# Patient Record
Sex: Male | Born: 1945
Health system: Southern US, Community
[De-identification: ages and names within clinical notes are randomized; demographics above are authoritative.]

## PROBLEM LIST (undated history)

## (undated) DIAGNOSIS — I442 Atrioventricular block, complete: Secondary | ICD-10-CM

## (undated) DIAGNOSIS — I714 Abdominal aortic aneurysm, without rupture, unspecified: Secondary | ICD-10-CM

## (undated) DIAGNOSIS — R7989 Other specified abnormal findings of blood chemistry: Secondary | ICD-10-CM

## (undated) DIAGNOSIS — E785 Hyperlipidemia, unspecified: Secondary | ICD-10-CM

## (undated) DIAGNOSIS — K635 Polyp of colon: Secondary | ICD-10-CM

## (undated) DIAGNOSIS — M1990 Unspecified osteoarthritis, unspecified site: Secondary | ICD-10-CM

## (undated) DIAGNOSIS — I519 Heart disease, unspecified: Secondary | ICD-10-CM

## (undated) DIAGNOSIS — R945 Abnormal results of liver function studies: Secondary | ICD-10-CM

## (undated) DIAGNOSIS — I2510 Atherosclerotic heart disease of native coronary artery without angina pectoris: Secondary | ICD-10-CM

## (undated) DIAGNOSIS — I4891 Unspecified atrial fibrillation: Secondary | ICD-10-CM

## (undated) DIAGNOSIS — R5383 Other fatigue: Secondary | ICD-10-CM

## (undated) DIAGNOSIS — K219 Gastro-esophageal reflux disease without esophagitis: Secondary | ICD-10-CM

## (undated) DIAGNOSIS — G4730 Sleep apnea, unspecified: Secondary | ICD-10-CM

## (undated) DIAGNOSIS — R7301 Impaired fasting glucose: Secondary | ICD-10-CM

## (undated) DIAGNOSIS — M109 Gout, unspecified: Secondary | ICD-10-CM

## (undated) HISTORY — DX: Atherosclerotic heart disease of native coronary artery without angina pectoris: I25.10

## (undated) HISTORY — DX: Hyperlipidemia, unspecified: E78.5

## (undated) HISTORY — PX: AV NODE ABLATION: SHX1209

## (undated) HISTORY — DX: Abdominal aortic aneurysm, without rupture: I71.4

## (undated) HISTORY — PX: REPLACEMENT TOTAL KNEE: SUR1224

## (undated) HISTORY — DX: Unspecified osteoarthritis, unspecified site: M19.90

## (undated) HISTORY — DX: Sleep apnea, unspecified: G47.30

## (undated) HISTORY — DX: Abdominal aortic aneurysm, without rupture, unspecified: I71.40

## (undated) HISTORY — DX: Other fatigue: R53.83

## (undated) HISTORY — PX: CARDIAC DEFIBRILLATOR PLACEMENT: SHX171

## (undated) HISTORY — DX: Gout, unspecified: M10.9

## (undated) HISTORY — DX: Unspecified atrial fibrillation: I48.91

## (undated) HISTORY — DX: Other specified abnormal findings of blood chemistry: R79.89

## (undated) HISTORY — PX: HAND SURGERY: SHX662

## (undated) HISTORY — PX: HERNIA REPAIR: SHX51

## (undated) HISTORY — PX: HYDROCELE EXCISION: SHX482

## (undated) HISTORY — DX: Hemochromatosis, unspecified: E83.119

## (undated) HISTORY — DX: Gastro-esophageal reflux disease without esophagitis: K21.9

## (undated) HISTORY — DX: Impaired fasting glucose: R73.01

## (undated) HISTORY — DX: Atrioventricular block, complete: I44.2

## (undated) HISTORY — DX: Abnormal results of liver function studies: R94.5

## (undated) HISTORY — DX: Polyp of colon: K63.5

## (undated) HISTORY — DX: Heart disease, unspecified: I51.9

---

## 2001-12-07 ENCOUNTER — Ambulatory Visit (HOSPITAL_COMMUNITY): Admission: RE | Admit: 2001-12-07 | Discharge: 2001-12-07 | Payer: Self-pay | Admitting: Surgery

## 2004-10-10 HISTORY — PX: PACEMAKER INSERTION: SHX728

## 2005-08-23 ENCOUNTER — Ambulatory Visit: Payer: Self-pay | Admitting: Internal Medicine

## 2005-09-02 ENCOUNTER — Ambulatory Visit: Payer: Self-pay | Admitting: Internal Medicine

## 2005-09-08 ENCOUNTER — Observation Stay (HOSPITAL_COMMUNITY): Admission: AD | Admit: 2005-09-08 | Discharge: 2005-09-09 | Payer: Self-pay | Admitting: Internal Medicine

## 2005-09-08 ENCOUNTER — Ambulatory Visit: Payer: Self-pay | Admitting: Internal Medicine

## 2005-09-21 ENCOUNTER — Ambulatory Visit: Payer: Self-pay

## 2006-01-03 ENCOUNTER — Ambulatory Visit: Payer: Self-pay | Admitting: Internal Medicine

## 2006-03-07 ENCOUNTER — Ambulatory Visit: Payer: Self-pay | Admitting: Internal Medicine

## 2006-03-31 ENCOUNTER — Emergency Department (HOSPITAL_COMMUNITY): Admission: EM | Admit: 2006-03-31 | Discharge: 2006-04-01 | Payer: Self-pay | Admitting: Emergency Medicine

## 2006-04-21 ENCOUNTER — Ambulatory Visit: Payer: Self-pay | Admitting: Internal Medicine

## 2006-05-01 ENCOUNTER — Encounter: Payer: Self-pay | Admitting: Internal Medicine

## 2006-05-01 ENCOUNTER — Ambulatory Visit (HOSPITAL_COMMUNITY): Admission: RE | Admit: 2006-05-01 | Discharge: 2006-05-01 | Payer: Self-pay | Admitting: Internal Medicine

## 2006-05-01 ENCOUNTER — Encounter (INDEPENDENT_AMBULATORY_CARE_PROVIDER_SITE_OTHER): Payer: Self-pay | Admitting: Specialist

## 2006-05-09 ENCOUNTER — Ambulatory Visit: Payer: Self-pay | Admitting: Internal Medicine

## 2006-07-06 ENCOUNTER — Ambulatory Visit: Payer: Self-pay

## 2006-09-08 ENCOUNTER — Ambulatory Visit: Payer: Self-pay

## 2007-04-03 ENCOUNTER — Ambulatory Visit: Payer: Self-pay | Admitting: Internal Medicine

## 2007-06-26 ENCOUNTER — Ambulatory Visit: Payer: Self-pay | Admitting: Internal Medicine

## 2009-01-08 DIAGNOSIS — Z860100 Personal history of colon polyps, unspecified: Secondary | ICD-10-CM | POA: Insufficient documentation

## 2009-01-08 DIAGNOSIS — E7800 Pure hypercholesterolemia, unspecified: Secondary | ICD-10-CM

## 2009-01-08 DIAGNOSIS — K219 Gastro-esophageal reflux disease without esophagitis: Secondary | ICD-10-CM

## 2009-01-08 DIAGNOSIS — I499 Cardiac arrhythmia, unspecified: Secondary | ICD-10-CM | POA: Insufficient documentation

## 2009-01-08 DIAGNOSIS — I10 Essential (primary) hypertension: Secondary | ICD-10-CM | POA: Insufficient documentation

## 2009-01-13 ENCOUNTER — Ambulatory Visit: Payer: Self-pay | Admitting: Internal Medicine

## 2009-01-13 DIAGNOSIS — R1319 Other dysphagia: Secondary | ICD-10-CM

## 2009-01-20 ENCOUNTER — Ambulatory Visit (HOSPITAL_COMMUNITY): Admission: RE | Admit: 2009-01-20 | Discharge: 2009-01-20 | Payer: Self-pay | Admitting: Internal Medicine

## 2009-01-21 ENCOUNTER — Encounter: Payer: Self-pay | Admitting: Internal Medicine

## 2009-02-09 ENCOUNTER — Inpatient Hospital Stay (HOSPITAL_COMMUNITY): Admission: RE | Admit: 2009-02-09 | Discharge: 2009-02-13 | Payer: Self-pay | Admitting: Orthopedic Surgery

## 2009-02-11 ENCOUNTER — Encounter: Payer: Self-pay | Admitting: Internal Medicine

## 2009-02-12 ENCOUNTER — Encounter: Payer: Self-pay | Admitting: Internal Medicine

## 2009-04-25 ENCOUNTER — Encounter: Payer: Self-pay | Admitting: Internal Medicine

## 2009-04-25 ENCOUNTER — Emergency Department (HOSPITAL_COMMUNITY): Admission: EM | Admit: 2009-04-25 | Discharge: 2009-04-25 | Payer: Self-pay | Admitting: Emergency Medicine

## 2009-04-30 ENCOUNTER — Ambulatory Visit: Payer: Self-pay | Admitting: Internal Medicine

## 2009-04-30 ENCOUNTER — Encounter: Payer: Self-pay | Admitting: Internal Medicine

## 2009-04-30 DIAGNOSIS — I428 Other cardiomyopathies: Secondary | ICD-10-CM

## 2009-04-30 LAB — CONVERTED CEMR LAB
Basophils Relative: 0.6 % (ref 0.0–3.0)
CO2: 29 meq/L (ref 19–32)
Chloride: 106 meq/L (ref 96–112)
Creatinine, Ser: 1.1 mg/dL (ref 0.4–1.5)
Eosinophils Relative: 2.1 % (ref 0.0–5.0)
Hemoglobin: 13.5 g/dL (ref 13.0–17.0)
INR: 2.3 — ABNORMAL HIGH (ref 0.8–1.0)
Lymphocytes Relative: 13.4 % (ref 12.0–46.0)
MCHC: 34.8 g/dL (ref 30.0–36.0)
MCV: 95.9 fL (ref 78.0–100.0)
Monocytes Absolute: 0.4 10*3/uL (ref 0.1–1.0)
Neutro Abs: 5 10*3/uL (ref 1.4–7.7)
Neutrophils Relative %: 77.4 % — ABNORMAL HIGH (ref 43.0–77.0)
RBC: 4.03 M/uL — ABNORMAL LOW (ref 4.22–5.81)
Sodium: 140 meq/L (ref 135–145)
WBC: 6.3 10*3/uL (ref 4.5–10.5)
aPTT: 36.8 s — ABNORMAL HIGH (ref 21.7–28.8)

## 2009-05-01 ENCOUNTER — Ambulatory Visit: Payer: Self-pay | Admitting: Internal Medicine

## 2009-05-01 ENCOUNTER — Ambulatory Visit (HOSPITAL_COMMUNITY): Admission: RE | Admit: 2009-05-01 | Discharge: 2009-05-01 | Payer: Self-pay | Admitting: Internal Medicine

## 2009-05-04 ENCOUNTER — Encounter: Payer: Self-pay | Admitting: Internal Medicine

## 2009-05-11 ENCOUNTER — Ambulatory Visit: Payer: Self-pay

## 2009-05-11 ENCOUNTER — Encounter: Payer: Self-pay | Admitting: Internal Medicine

## 2009-06-19 ENCOUNTER — Telehealth (INDEPENDENT_AMBULATORY_CARE_PROVIDER_SITE_OTHER): Payer: Self-pay | Admitting: *Deleted

## 2009-07-01 ENCOUNTER — Encounter (INDEPENDENT_AMBULATORY_CARE_PROVIDER_SITE_OTHER): Payer: Self-pay | Admitting: *Deleted

## 2009-08-14 ENCOUNTER — Ambulatory Visit: Payer: Self-pay | Admitting: Internal Medicine

## 2009-08-14 DIAGNOSIS — I442 Atrioventricular block, complete: Secondary | ICD-10-CM

## 2009-08-14 DIAGNOSIS — I4891 Unspecified atrial fibrillation: Secondary | ICD-10-CM | POA: Insufficient documentation

## 2009-08-26 ENCOUNTER — Inpatient Hospital Stay (HOSPITAL_COMMUNITY): Admission: RE | Admit: 2009-08-26 | Discharge: 2009-08-29 | Payer: Self-pay | Admitting: Orthopedic Surgery

## 2009-12-03 ENCOUNTER — Ambulatory Visit (HOSPITAL_COMMUNITY): Admission: RE | Admit: 2009-12-03 | Discharge: 2009-12-04 | Payer: Self-pay | Admitting: Orthopedic Surgery

## 2009-12-29 ENCOUNTER — Encounter: Payer: Self-pay | Admitting: Internal Medicine

## 2010-05-10 ENCOUNTER — Ambulatory Visit: Payer: Self-pay | Admitting: Cardiology

## 2010-05-12 ENCOUNTER — Ambulatory Visit (HOSPITAL_COMMUNITY): Admission: RE | Admit: 2010-05-12 | Discharge: 2010-05-12 | Payer: Self-pay | Admitting: Cardiology

## 2010-05-12 ENCOUNTER — Ambulatory Visit: Payer: Self-pay | Admitting: Cardiology

## 2010-06-06 ENCOUNTER — Ambulatory Visit: Payer: Self-pay | Admitting: Cardiology

## 2010-06-18 ENCOUNTER — Ambulatory Visit: Payer: Self-pay | Admitting: Cardiology

## 2010-10-21 ENCOUNTER — Ambulatory Visit: Payer: Self-pay | Admitting: Cardiology

## 2010-10-31 ENCOUNTER — Encounter: Payer: Self-pay | Admitting: Internal Medicine

## 2010-11-09 NOTE — Miscellaneous (Signed)
Summary: DEVICE UPDATE  Clinical Lists Changes  Observations: Added new observation of ICD MD: Inactive (12/29/2009 10:00)       ICD Specifications Following MD:  Inactive     Referring MD:  Delfin Edis, MD ICD Vendor:  Medtronic     ICD Model Number:  [phone]     ICD Serial Number:  [id number] ICD DOI:  05/01/2009     ICD Implanting MD:  Hillis Range, MD  Lead 1:    Location: RA     DOI: 09/08/2005     Model #: 2956     Serial #: [id number]     Status: active Lead 2:    Location: RV     DOI: 09/08/2005     Model #: 6962     Serial #: [id number]     Status: active Lead 3:    Location: LV     DOI: 09/08/2005     Model #: 4194     Serial #: [id number]     Status: active Lead 4:    Location: RV     DOI: 09/08/2005     Model #: 6644     Serial #: [id number]     Status: active   Explantation Comments: InSync Maximo  [id number] h explanted 05/01/2009  ICD Follow Up ICD Dependent:  Yes      Episodes Coumadin:  Yes  Brady Parameters Mode VVIR     Lower Rate Limit:  60     Upper Rate Limit 120  Tachy Zones VF:  200     VT:  250     VT1:  171

## 2010-11-10 ENCOUNTER — Encounter: Payer: Self-pay | Admitting: Internal Medicine

## 2010-11-10 ENCOUNTER — Ambulatory Visit: Admit: 2010-11-10 | Payer: Self-pay | Admitting: Internal Medicine

## 2010-11-10 ENCOUNTER — Encounter (INDEPENDENT_AMBULATORY_CARE_PROVIDER_SITE_OTHER): Payer: BLUE CROSS/BLUE SHIELD

## 2010-11-10 DIAGNOSIS — I428 Other cardiomyopathies: Secondary | ICD-10-CM

## 2010-11-17 NOTE — Procedures (Signed)
Summary: [facility]   Current Medications (verified): 1)  Coumadin 7.5 Mg Tabs (Warfarin Sodium) .Marland Kitchen.. 1 By Mouth 5 Days Per Week 2)  Coumadin 10 Mg Tabs (Warfarin Sodium) .Marland Kitchen.. 1 By Mouth Once Daily 2 Days Per Week 3)  Carvedilol 25 Mg Tabs (Carvedilol) .... One By Mouth Two Times A Day 4)  Simvastatin 80 Mg Tabs (Simvastatin) .... Take Half Tablet By Mouth Daily At Bedtime 5)  Diovan 80 Mg Tabs (Valsartan) .... Take One Tablet By Mouth Daily  Allergies: 1)  ! Niacin 2)  ! Advicor (Niacin-Lovastatin) 3)  ! * No Ivp Dye Allergy 4)  ! * No Shellfish Allergy 5)  ! * No Latex Allergy   ICD Specifications Following MD:  Hillis Range, MD     Referring MD:  Delfin Edis, MD ICD Vendor:  Medtronic     ICD Model Number:  [phone]     ICD Serial Number:  [id number] ICD DOI:  05/01/2009     ICD Implanting MD:  Hillis Range, MD  Lead 1:    Location: RA     DOI: 09/08/2005     Model #: 5621     Serial #: [id number]     Status: active Lead 2:    Location: RV     DOI: 09/08/2005     Model #: 6295     Serial #: [id number]     Status: active Lead 3:    Location: LV     DOI: 09/08/2005     Model #: 4194     Serial #: [id number]     Status: active Lead 4:    Location: RV     DOI: 09/08/2005     Model #: 4403     Serial #: [id number]     Status: active  Indications::  CM  Explantation Comments: InSync Maximo  [id number] h explanted 05/01/2009  ICD Follow Up Remote Check?  No Battery Voltage:  3.06 V     Charge Time:  10.1 seconds     Underlying rhythm:  A-fib/no escape ICD Dependent:  Yes       ICD Device Measurements Atrium:  Amplitude: 0.6 mV, Impedance: 551 ohms,  Right Ventricle:  Impedance: 589 ohms, Threshold: 0.5 V at 0.6 msec Left Ventricle:  Impedance: 798 ohms, Threshold: 0.75 V at 0.6 msec Shock Impedance: 46/64 ohms   Episodes MS Episodes:  0     Percent Mode Switch:  100%     Coumadin:  Yes Shock:  0     ATP:  0     Nonsustained:  0     Atrial Pacing:  0%      Ventricular Pacing:  99.9%  Brady Parameters Mode VVIR     Lower Rate Limit:  60     Upper Rate Limit 120  Tachy Zones VF:  200     VT:  250     VT1:  171     Next Remote Date:  02/10/2011     Next Cardiology Appt Due:  08/11/2011 Tech Comments:  Lead impedance alert values reprogrammed.  6949 lead stable, SIC  0.  Optivol and thoracic impedance normal.  Carelink transmissions every 3 months.  ROV 11/12 with Dr. Johney Frame. Altha Harm, LPN  November 10, 2010 11:54 AM

## 2010-12-21 NOTE — Cardiovascular Report (Signed)
Summary: Office Visit   Office Visit   Imported By: Roderic Ovens 12/15/2010 11:56:16  _____________________________________________________________________  External Attachment:    Type:   Image     Comment:   External Document

## 2010-12-29 LAB — BASIC METABOLIC PANEL
Chloride: 106 mEq/L (ref 96–112)
GFR calc non Af Amer: 59 mL/min — ABNORMAL LOW (ref >60)
Potassium: 3.9 mEq/L (ref 3.5–5.1)
Sodium: 141 mEq/L (ref 135–145)

## 2010-12-29 LAB — APTT: aPTT: 36 seconds (ref 24–37)

## 2010-12-29 LAB — CBC
HCT: 37.4 % — ABNORMAL LOW (ref 39.0–52.0)
Hemoglobin: 12.9 g/dL — ABNORMAL LOW (ref 13.0–17.0)
MCV: 96.8 fL (ref 78.0–100.0)
RBC: 3.86 MIL/uL — ABNORMAL LOW (ref 4.22–5.81)
WBC: 6.4 10*3/uL (ref 4.0–10.5)

## 2011-01-12 LAB — CBC
HCT: 27.8 % — ABNORMAL LOW (ref 39.0–52.0)
HCT: 38.6 % — ABNORMAL LOW (ref 39.0–52.0)
Hemoglobin: 9.7 g/dL — ABNORMAL LOW (ref 13.0–17.0)
Hemoglobin: 13.3 g/dL (ref 13.0–17.0)
MCHC: 34 g/dL (ref 30.0–36.0)
MCV: 96.8 fL (ref 78.0–100.0)
RBC: 2.44 MIL/uL — ABNORMAL LOW (ref 4.22–5.81)
RBC: 2.89 MIL/uL — ABNORMAL LOW (ref 4.22–5.81)
RBC: 3 MIL/uL — ABNORMAL LOW (ref 4.22–5.81)
RBC: 3.99 MIL/uL — ABNORMAL LOW (ref 4.22–5.81)
WBC: 8.3 10*3/uL (ref 4.0–10.5)
WBC: 8.8 10*3/uL (ref 4.0–10.5)
WBC: 9.7 10*3/uL (ref 4.0–10.5)
WBC: 4.2 10*3/uL (ref 4.0–10.5)

## 2011-01-12 LAB — CROSSMATCH: Antibody Screen: NEGATIVE

## 2011-01-12 LAB — PROTIME-INR
INR: 1.15 (ref 0.00–1.49)
INR: 1.3 (ref 0.00–1.49)
INR: 1.42 (ref 0.00–1.49)
Prothrombin Time: 14.6 seconds (ref 11.6–15.2)
Prothrombin Time: 16.1 seconds — ABNORMAL HIGH (ref 11.6–15.2)
Prothrombin Time: 17.2 seconds — ABNORMAL HIGH (ref 11.6–15.2)

## 2011-01-12 LAB — COMPREHENSIVE METABOLIC PANEL
AST: 20 U/L (ref 0–37)
BUN: 21 mg/dL (ref 6–23)
CO2: 29 mEq/L (ref 19–32)
Chloride: 107 mEq/L (ref 96–112)
Creatinine, Ser: 1.02 mg/dL (ref 0.4–1.5)
GFR calc Af Amer: >60 mL/min (ref >60)
GFR calc non Af Amer: >60 mL/min (ref >60)
Total Bilirubin: 1.1 mg/dL (ref 0.3–1.2)

## 2011-01-12 LAB — URINALYSIS, ROUTINE W REFLEX MICROSCOPIC
Glucose, UA: NEGATIVE mg/dL
Ketones, ur: NEGATIVE mg/dL
Leukocytes, UA: NEGATIVE
Nitrite: NEGATIVE
Specific Gravity, Urine: 1.037 — ABNORMAL HIGH (ref 1.005–1.030)
Urobilinogen, UA: 1 mg/dL (ref 0.0–1.0)
pH: 6 (ref 5.0–8.0)

## 2011-01-12 LAB — BASIC METABOLIC PANEL
CO2: 30 mEq/L (ref 19–32)
Calcium: 8 mg/dL — ABNORMAL LOW (ref 8.4–10.5)
Calcium: 8.1 mg/dL — ABNORMAL LOW (ref 8.4–10.5)
Chloride: 101 mEq/L (ref 96–112)
Creatinine, Ser: 0.87 mg/dL (ref 0.4–1.5)
Creatinine, Ser: 0.94 mg/dL (ref 0.4–1.5)
GFR calc Af Amer: >60 mL/min (ref >60)
GFR calc Af Amer: >60 mL/min (ref >60)
GFR calc non Af Amer: >60 mL/min (ref >60)
GFR calc non Af Amer: >60 mL/min (ref >60)
Potassium: 3.6 mEq/L (ref 3.5–5.1)
Sodium: 133 mEq/L — ABNORMAL LOW (ref 135–145)

## 2011-01-12 LAB — APTT
aPTT: 29 seconds (ref 24–37)
aPTT: 30 seconds (ref 24–37)

## 2011-01-12 LAB — URINE CULTURE
Colony Count: NO GROWTH
Special Requests: NEGATIVE

## 2011-01-12 LAB — URINE MICROSCOPIC-ADD ON

## 2011-01-16 LAB — GLUCOSE, CAPILLARY: Glucose-Capillary: 75 mg/dL (ref 70–99)

## 2011-01-16 LAB — PROTIME-INR: Prothrombin Time: 25.3 seconds — ABNORMAL HIGH (ref 11.6–15.2)

## 2011-01-16 LAB — APTT: aPTT: 42 seconds — ABNORMAL HIGH (ref 24–37)

## 2011-01-18 LAB — CBC
HCT: 25.8 % — ABNORMAL LOW (ref 39.0–52.0)
HCT: 29.4 % — ABNORMAL LOW (ref 39.0–52.0)
Hemoglobin: 10.4 g/dL — ABNORMAL LOW (ref 13.0–17.0)
Hemoglobin: 9.2 g/dL — ABNORMAL LOW (ref 13.0–17.0)
Platelets: 141 10*3/uL — ABNORMAL LOW (ref 150–400)
Platelets: 177 10*3/uL (ref 150–400)
RBC: 2.63 MIL/uL — ABNORMAL LOW (ref 4.22–5.81)
RBC: 3.48 MIL/uL — ABNORMAL LOW (ref 4.22–5.81)
WBC: 6.7 10*3/uL (ref 4.0–10.5)
WBC: 9.5 10*3/uL (ref 4.0–10.5)
WBC: 9.9 10*3/uL (ref 4.0–10.5)

## 2011-01-18 LAB — BASIC METABOLIC PANEL
BUN: 14 mg/dL (ref 6–23)
CO2: 26 mEq/L (ref 19–32)
Calcium: 8.1 mg/dL — ABNORMAL LOW (ref 8.4–10.5)
Calcium: 8.3 mg/dL — ABNORMAL LOW (ref 8.4–10.5)
Calcium: 8.3 mg/dL — ABNORMAL LOW (ref 8.4–10.5)
Chloride: 98 mEq/L (ref 96–112)
Creatinine, Ser: 0.97 mg/dL (ref 0.4–1.5)
Creatinine, Ser: 1.05 mg/dL (ref 0.4–1.5)
GFR calc Af Amer: >60 mL/min (ref >60)
GFR calc Af Amer: >60 mL/min (ref >60)
GFR calc Af Amer: >60 mL/min (ref >60)
GFR calc non Af Amer: >60 mL/min (ref >60)
Potassium: 4 mEq/L (ref 3.5–5.1)
Potassium: 4.1 mEq/L (ref 3.5–5.1)
Sodium: 131 mEq/L — ABNORMAL LOW (ref 135–145)
Sodium: 136 mEq/L (ref 135–145)

## 2011-01-18 LAB — PROTIME-INR
INR: 1.1 (ref 0.00–1.49)
INR: 1.2 (ref 0.00–1.49)
INR: 1.7 — ABNORMAL HIGH (ref 0.00–1.49)
Prothrombin Time: 15 seconds (ref 11.6–15.2)
Prothrombin Time: 15.5 seconds — ABNORMAL HIGH (ref 11.6–15.2)

## 2011-01-18 LAB — APTT: aPTT: 30 seconds (ref 24–37)

## 2011-01-18 LAB — BRAIN NATRIURETIC PEPTIDE: Pro B Natriuretic peptide (BNP): 208 pg/mL — ABNORMAL HIGH (ref 0.0–100.0)

## 2011-01-18 LAB — TROPONIN I: Troponin I: 0.01 ng/mL (ref 0.00–0.06)

## 2011-01-18 LAB — ABO/RH: ABO/RH(D): O POS

## 2011-01-19 LAB — COMPREHENSIVE METABOLIC PANEL
ALT: 25 U/L (ref 0–53)
AST: 26 U/L (ref 0–37)
Albumin: 3.9 g/dL (ref 3.5–5.2)
Alkaline Phosphatase: 76 U/L (ref 39–117)
BUN: 15 mg/dL (ref 6–23)
Chloride: 105 mEq/L (ref 96–112)
GFR calc Af Amer: >60 mL/min (ref >60)
Potassium: 4.4 mEq/L (ref 3.5–5.1)
Sodium: 140 mEq/L (ref 135–145)
Total Bilirubin: 1 mg/dL (ref 0.3–1.2)

## 2011-01-19 LAB — CBC
HCT: 42.7 % (ref 39.0–52.0)
Platelets: 227 10*3/uL (ref 150–400)
WBC: 5.2 10*3/uL (ref 4.0–10.5)

## 2011-01-19 LAB — URINALYSIS, ROUTINE W REFLEX MICROSCOPIC
Bilirubin Urine: NEGATIVE
Glucose, UA: NEGATIVE mg/dL
Hgb urine dipstick: NEGATIVE
Specific Gravity, Urine: 1.021 (ref 1.005–1.030)
pH: 6 (ref 5.0–8.0)

## 2011-01-25 ENCOUNTER — Encounter: Payer: Self-pay | Admitting: Internal Medicine

## 2011-01-25 ENCOUNTER — Ambulatory Visit (INDEPENDENT_AMBULATORY_CARE_PROVIDER_SITE_OTHER): Payer: PRIVATE HEALTH INSURANCE | Admitting: Internal Medicine

## 2011-01-25 VITALS — BP 96/56 | HR 84 | Ht 77.0 in | Wt 274.0 lb

## 2011-01-25 DIAGNOSIS — K219 Gastro-esophageal reflux disease without esophagitis: Secondary | ICD-10-CM

## 2011-01-25 DIAGNOSIS — Z860100 Personal history of colon polyps, unspecified: Secondary | ICD-10-CM

## 2011-01-25 DIAGNOSIS — Z1211 Encounter for screening for malignant neoplasm of colon: Secondary | ICD-10-CM

## 2011-01-25 DIAGNOSIS — D689 Coagulation defect, unspecified: Secondary | ICD-10-CM

## 2011-01-25 DIAGNOSIS — R1310 Dysphagia, unspecified: Secondary | ICD-10-CM

## 2011-01-25 MED ORDER — PEG-KCL-NACL-NASULF-NA ASC-C 100 G PO SOLR: 1.0000 | Freq: Once | ORAL | Status: AC

## 2011-01-25 NOTE — Patient Instructions (Signed)
Colon/Endo with Dil scheduled for 02/03/11 1:30 arrive at 12:30 on 4th floor. Movi prep prescription has been sent to your pharmacy for you to pick up. Colonoscopy and Endoscopy brochures give for your review. We will send Dr. Deborah Chalk a letter to approve holding your Coumadin x 5 days prior to your procedure. Our office will contact you regarding this.   If you have not heard from [place] by 1 week prior to your procedure, please call us.

## 2011-01-25 NOTE — Progress Notes (Signed)
HISTORY OF PRESENT ILLNESS:  [name] is a 65 y.o. 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 He is on chronic Coumadin therapy. He presents today regarding surveillance colonoscopy as well as ongoing problems with intermittent dysphagia. He was seen last year regarding swallowing difficulties. Barium esophagogram with tablet was essentially unremarkable. He was placed on PPI therapy for known GERD. Despite this, no improvement in swallowing. A small hiatal hernia was noted. He continues to describe what sounds like intermittent solid food dysphagia. For GERD symptoms he takes a vinegar extract, which he states works. His cardiovascular problems have been stable. His cardiologist is Dr. Deborah Chalk. His last colonoscopy was in July of 2007. Descending colon adenoma removed. Currently due for followup.  REVIEW OF SYSTEMS:  All non-GI ROS negative except for redness, fatigue, hearing problems, heart rhythm change, shortness of breath, insomnia.  Past Medical History  Diagnosis Date  . Atrial fibrillation   . Hyperlipidemia   . Sleep apnea   . Arthritis     Past Surgical History  Procedure Date  . Hernia repair   . Replacement total knee     bilateral  . Hydrocele excision   . Av node ablation   . Pacemaker insertion   . Cardiac defibrillator placement     X2  . Hand surgery     left    Social History [name]  reports that he quit smoking about 32 years ago. His smoking use included Cigarettes. He has never used smokeless tobacco. He reports that he drinks alcohol. He reports that he does not use illicit drugs.  family history includes Brain cancer in his brother.  There is no history of Colon cancer.  Allergies  Allergen Reactions  . Lisinopril   . Niacin        PHYSICAL  EXAMINATION: Vital signs: BP 96/56  Pulse 84  Ht 6\' 5"  (1.956 m)  Wt 274 lb (124.286 kg)  BMI 32.49 kg/m2  Constitutional: generally well-appearing, no acute distress Psychiatric: alert and oriented x3, cooperative Eyes: extraocular movements intact, anicteric, conjunctiva pink Mouth: oral pharynx moist, no lesions Neck: supple no lymphadenopathy Cardiovascular: heart regular rate and rhythm, no murmur Lungs: clear to auscultation bilaterally Abdomen: soft, nontender, nondistended, no obvious ascites, no peritoneal signs, normal bowel sounds, no organomegaly Rectal: Deferred until colonoscopy Extremities: no lower extremity edema bilaterally Skin: no lesions on visible extremities Neuro: No focal deficits..     ASSESSMENT:  #1. Intermittent solid food dysphagia. Likely has subtle ring or stricture not appreciated on esophagram.  #2. GERD. Currently being managed with ventricular extract.  #3. History of adenomatous colon polyp. Due for followup surveillance colonoscopy  #4. Multiple cardiovascular problems with implantable defibrillator and pacemaker placement as well as the need for chronic Coumadin therapy   PLAN:  #1. Upper endoscopy with esophageal dilation.The nature of the procedure, as well as the risks, benefits, and alternatives were carefully and thoroughly reviewed with the patient. Ample time for discussion and questions allowed. The patient understood, was satisfied, and agreed to proceed. The patient is high risk. We will consult with his cardiologist regarding the feasibility of holding Coumadin therapy (as was done previously) for his procedure.  #2. Colonoscopy with polypectomy if necessary.The nature of the procedure, as well as the risks, benefits, and alternatives were carefully and thoroughly reviewed with the patient. Ample time for discussion and  questions allowed. The patient understood, was satisfied, and agreed to proceed. Will be performed concurrently  with upper endoscopy for clinical reasons

## 2011-01-26 ENCOUNTER — Telehealth: Payer: Self-pay | Admitting: Internal Medicine

## 2011-01-26 ENCOUNTER — Telehealth: Payer: Self-pay | Admitting: *Deleted

## 2011-01-26 NOTE — Telephone Encounter (Signed)
Pt called with concerns regarding his procedures he has scheduled and his insurance coverage. Morrie Sheldon in precert to call patient back and speck with him regarding his coverage.

## 2011-01-26 NOTE — Telephone Encounter (Signed)
Pt. Informed.

## 2011-02-02 ENCOUNTER — Encounter: Payer: Self-pay | Admitting: Internal Medicine

## 2011-02-03 ENCOUNTER — Encounter: Payer: PRIVATE HEALTH INSURANCE | Admitting: Internal Medicine

## 2011-02-03 ENCOUNTER — Ambulatory Visit (AMBULATORY_SURGERY_CENTER): Payer: PRIVATE HEALTH INSURANCE | Admitting: Internal Medicine

## 2011-02-03 ENCOUNTER — Encounter: Payer: Self-pay | Admitting: Internal Medicine

## 2011-02-03 VITALS — BP 122/59 | HR 60 | Temp 97.5°F | Resp 17 | Ht 77.0 in | Wt 270.0 lb

## 2011-02-03 DIAGNOSIS — Z860100 Personal history of colon polyps, unspecified: Secondary | ICD-10-CM

## 2011-02-03 DIAGNOSIS — Z1211 Encounter for screening for malignant neoplasm of colon: Secondary | ICD-10-CM

## 2011-02-03 MED ORDER — SODIUM CHLORIDE 0.9 % IV SOLN: 500.0000 mL | INTRAVENOUS | Status: DC

## 2011-02-03 NOTE — Progress Notes (Signed)
Pressure applied to abdomen to reach cecum.  

## 2011-02-03 NOTE — Patient Instructions (Signed)
Discharged instructions given with verbal understanding. Normal examination. Resume previous medications.

## 2011-02-04 ENCOUNTER — Telehealth: Payer: Self-pay

## 2011-02-04 NOTE — Telephone Encounter (Signed)
No answer

## 2011-02-10 ENCOUNTER — Encounter: Payer: BLUE CROSS/BLUE SHIELD | Admitting: *Deleted

## 2011-02-13 ENCOUNTER — Encounter: Payer: Self-pay | Admitting: *Deleted

## 2011-02-21 ENCOUNTER — Ambulatory Visit (INDEPENDENT_AMBULATORY_CARE_PROVIDER_SITE_OTHER): Payer: PRIVATE HEALTH INSURANCE | Admitting: *Deleted

## 2011-02-21 ENCOUNTER — Other Ambulatory Visit: Payer: Self-pay | Admitting: Internal Medicine

## 2011-02-21 DIAGNOSIS — I442 Atrioventricular block, complete: Secondary | ICD-10-CM

## 2011-02-21 DIAGNOSIS — I509 Heart failure, unspecified: Secondary | ICD-10-CM

## 2011-02-21 DIAGNOSIS — I428 Other cardiomyopathies: Secondary | ICD-10-CM

## 2011-02-21 DIAGNOSIS — I4891 Unspecified atrial fibrillation: Secondary | ICD-10-CM

## 2011-02-22 NOTE — Op Note (Signed)
NAME:  [name], [name] NO.:  [id number]   MEDICAL RECORD NO.:  [id number]          PATIENT TYPE:  INP   LOCATION:  0003                         FACILITY:  [facility]   PHYSICIAN:  Ollen Gross, M.D.    DATE OF BIRTH:  [birth year]   DATE OF PROCEDURE:  02/09/2009  DATE OF DISCHARGE:                               OPERATIVE REPORT   PREOPERATIVE DIAGNOSIS:  Osteoarthritis, right knee.   POSTOPERATIVE DIAGNOSIS:  Osteoarthritis, right knee.   PROCEDURE:  Right total knee arthroplasty.   SURGEON:  Ollen Gross, M.D.   ASSISTANT:  Alexzandrew L. Perkins, P.A.C.   ANESTHESIA:  Spinal.   ESTIMATED BLOOD LOSS:  Minimal.   DRAINS:  None.   TOURNIQUET TIME:  38 minutes at 300 mmHg.   COMPLICATIONS:  None.   CONDITION:  Stable to recovery.   BRIEF CLINICAL NOTE:  [name] is a 65 year old male with severe end-  stage arthritis of the right knee with progressively worsening pain and  dysfunction.  He has failed nonoperative management and presents now for  right total knee arthroplasty.   PROCEDURE IN DETAIL:  After successful administration of spinal  anesthetic a tourniquet was placed high on the right thigh and right  lower extremity prepped and draped in the usual sterile fashion.  The  extremity was wrapped in Esmarch, knee flexed, tourniquet inflated to  300 mmHg.  A midline incision was made with a 10 blade through  subcutaneous tissue to the level of the extensor mechanism.  A fresh  blade was used to make a medial parapatellar arthrotomy.  The soft  tissue of the proximal medial tibia was subperiosteally elevated to the  joint line with the knife and into the semimembranosus bursa with a Cobb  elevator.  The soft tissue laterally was elevated with attention being  paid to avoid the patellar tendon on the tibial tubercle.  The patella  was subluxed laterally, knee flexed 90 degrees and ACL and PCL were  removed.  A drill was used create a starting hole in  the distal femur  and the canal was thoroughly irrigated.  The 5 degrees right valgus  alignment guide was placed referencing off the posterior condyles.  Rotation was marked and the block pinned to remove 11 mm off the distal  femur.  I took 11 because I felt he was going to be a size 6 which  requires extra resection.  The distal femoral resection was made with an  oscillating saw.  The sizing block was placed and size 6 was most  appropriate.  The rotation was marked off the epicondylar axis.  The  size 6 cutting block was placed and the anterior, posterior and chamfer  cuts were made.   The tibia was subluxed forward and the menisci were removed.  The  extramedullary tibial alignment guide was placed referencing proximally  at the medial aspect of the tibial tubercle and distally along the  second metatarsal axis and tibial crest.  The block was pinned to remove  about 2 mm off the more deficient medial side.  The tibial  resection was  made with an oscillating saw.  The sizing block was placed and size 5  was most appropriate.  The proximal tibia was then prepared with the  modular drill and keel punch for the size 5.  Femoral preparation was  completed with the intercondylar cut for the size 6.   The size 5 mobile bearing tibial trial, size 6 posterior stabilized  femoral trial and a 10 mm posterior stabilized rotating platform insert  trial are placed.  With the 10, he has a tiny bit of hyperextension so I  went to 12.5 which allowed for full extension with excellent varus,  valgus, anterior and posterior balance throughout full range of motion.  The patella was then everted and thickness measured to be 27 mm.  The  freehand resection was taken to 15 mm, a 41 template was placed, lug  holes were drilled, trial patella was placed and it tracks normally.  Osteophytes were removed off the posterior femur with the trial in  place.  All trials were removed and the cut bone surfaces  were prepared  with pulsatile lavage.  The cement was mixed and once ready for  implantation a size 5 mobile bearing tibial tray, a size 6 posterior  stabilized femur and 41 patella were cemented into place and the patella  was held with a clamp.  The trial 12.5 mm insert was placed, the knee  held in full extension and all extruded cement removed.  When the cement  was fully hardened then the permanent 12.5 mm posterior stabilized  rotating platform insert was placed into the tibial tray.  The wound was  copiously irrigated with saline solution and then the FloSeal injected  on the posterior capsule, medial and lateral gutters and the  suprapatellar area.  A moist sponge was placed and tourniquet released  with total time of 38 minutes.  The sponge was held for 2 minutes then  removed.  Minimal bleeding was encountered.  The bleeding that was  encountered was stopped with electrocautery.  The wound was again  irrigated and the arthrotomy closed with interrupted #1 PDS.  Flexion  against gravity was about 140 degrees.  The subcutaneous was closed with  interrupted 2-0 Vicryl and subcuticular running 4-0 Monocryl.  The  incision was cleaned and dried and Steri-Strips and bulky sterile  dressing were applied.  He was then placed into a knee immobilizer,  awakened and transported to recovery in stable condition.      Ollen Gross, M.D.  Electronically Signed     FA/MEDQ  D:  02/09/2009  T:  02/09/2009  Job:  [id number]

## 2011-02-22 NOTE — Op Note (Signed)
NAME:  NAREK, KNISS NO.:  [id number]   MEDICAL RECORD NO.:  [id number]          PATIENT TYPE:  OIB   LOCATION:  2899                         FACILITY:  [facility]   PHYSICIAN:  Hillis Range, MD       DATE OF BIRTH:  November 04, 1945   DATE OF PROCEDURE:  DATE OF DISCHARGE:  05/01/2009                               OPERATIVE REPORT   PREPROCEDURE DIAGNOSES:  1. Complete atrioventricular block  2. Nonischemic cardiomyopathy.  3. Permanent atrial fibrillation.   POSTPROCEDURE DIAGNOSES:  1. Complete atrioventricular block  2. Nonischemic cardiomyopathy.  3. Permanent atrial fibrillation.   PROCEDURES:  1. Biventricular implantable cardioverter-defibrillator pulse      generator replacement.  2. Lead evaluation and device testing with defibrillator threshold      testing.   INTRODUCTION:  Mr. Sames is a pleasant 65 year old gentleman with a  history of permanent atrial fibrillation status post AV nodal ablation  and pacemaker implantation in 2004.  He subsequently developed symptoms  of heart failure and underwent upgrade of his device to a biventricular  defibrillator by Dr. Graciela Husbands in 2006.  Though he had a Sprint Fidelis lead  placed, Dr. Graciela Husbands opted to use the previously implanted Medtronic model  4076 - 58 lead for pacing and sensing.  The patient has done very well  since that time.  He recently was found to have his device at elective  replacement interval.  He therefore presents today for a biventricular  ICD pulse generator replacement.   DESCRIPTION OF PROCEDURE:  Informed written consent was obtained and the  patient was brought to the electrophysiology lab in the fasting state.  He was adequately sedated with intravenous Versed and fentanyl as  outlined in the nursing report.  The patient's left chest was prepped  and draped in the usual sterile fashion by the EP lab staff.  The skin  overlying the left deltopectoral region was infiltrated with lidocaine  for local analgesia.  A 5-cm incision was made over the existing  defibrillator pocket.  The device was exposed using a combination of  sharp and blunt dissection.  Electrocautery was used to maintain  hemostasis throughout.  The device was freed within the pocket and  removed from the body.  The leads were then freed within the pocket.  They were examined in their integrity and was confirmed to be intact.  The leads were then disconnected from the device and the device was  discarded.  The atrial lead was confirmed to be a Medtronic model 5076  CapSure Fix (serial number [id number]) lead.  The right ventricular  pace sense lead was confirmed to be a Medtronic model 4076 - 58 (serial  number [id number]) lead.  The right ventricular defibrillator lead was  confirmed to be a Medtronic model [id number] Sprint Fidelis lead (serial  number [id number]) lead.  The pace sense portion of this lead had  previously been capped and was not interrogated today.  The left  ventricular lead was confirmed to be a Medtronic model 4194 Attain  (serial number [id number])  lead.  All leads were confirmed to have  intact insulation and their integrity appeared secure.  Right atrial  lead atrial fibrillation wave measured 1 mV.  The right ventricular pace  sense lead R-wave measured 22.8 mV with an impedance of 650 ohms and a  threshold of 0.75 volts at 0.5 milliseconds.  The left ventricular lead  R-wave measured 22.8 mV with an impedance of 789 ohms and a threshold of  1.2 volts at 0.5 milliseconds.  The shock impedance proximally was 56  ohms and distally was 46 ohms.  These leads were then connected to a  Medtronic Concerto II CRT-D (serial number [id number]) biventricular  ICD.  The Medtronic model [phone] 58 lead was placed into the pace sense  port of the right ventricular lead.  The existing Sprint Fidelis [id number]  SVC and RV shock coils were placed into their respective ports.  The  atrial lead was placed  into its respective port and the coronary sinus  lead was placed into its respective port.  The pocket was then irrigated  with copious gentamicin solution.  Pocket revision was required to  accommodate the new device.  Electrocautery was used for hemostasis.  The device was then placed into the pocket.  The pocket was closed in  two layers with 2.0 Vicryl suture for the subcutaneous and subcuticular  layers.  Steri-Strips and a sterile dressing were then applied.  Defibrillation threshold testing was then performed and ventricular  fibrillation was induced with a T shock.  Adequate sensing of  ventricular fibrillation was observed with a programmed sensitivity of  1.2 mV with no dropout.  The patient was successfully defibrillated with  a single 10 joules shock delivered from the device with a duration of 6  seconds and an impedance of 43 ohms.  He remained in atrial fibrillation  with ventricular pacing thereafter.  There were no early apparent  complications.   CONCLUSIONS:  1. Biventricular ICD at elective replacement interval.  2. Successful biventricular ICD pulse generator replacement.  3. DFT less than or equal to 10 joules.  4. No early apparent complications.      Hillis Range, MD  Electronically Signed     JA/MEDQ  D:  05/01/2009  T:  05/02/2009  Job:  [id number]   cc:   Colleen Can. Deborah Chalk, M.D.  Duke Salvia, MD, [facility]

## 2011-02-22 NOTE — Discharge Summary (Signed)
NAME:  MARISA, HUFSTETLER NO.:  [id number]   MEDICAL RECORD NO.:  [id number]          PATIENT TYPE:  OIB   LOCATION:  2899                         FACILITY:  [facility]   PHYSICIAN:  Hillis Range, MD       DATE OF BIRTH:  [birth year]   DATE OF ADMISSION:  05/01/2009  DATE OF DISCHARGE:  05/01/2009                               DISCHARGE SUMMARY   This patient has allergies to NIACIN, ADVICOR, and LISINOPRIL.   Time for this dictation and explanation to the patient greater than 35  minutes.   FINAL DIAGNOSIS:  Discharging day #1 status post generator change:  Explanted was a Medtronic Maximo biventricular implantable cardioverter-  defibrillator, implanted was a Medtronic CONCERTO II  CRT-D  biventricular implantable cardioverter-defibrillator.   SECONDARY DIAGNOSES:  1. Nonischemic cardiomyopathy.      a.     New York Heart Association class II chronic systolic       congestive heart failure.  2. Persistent atrial fibrillation/chronic Coumadin.      a.     Status post arteriovenous node ablation with pacemaker       implantation in 2004.  3. Medtronic MAXIMO implanted as a biventricular implantable      cardioverter-defibrillator on September 08, 1005 for progressive      left ventricular dilatation/decreased ejection fraction.  4. Dyslipidemia.  5. Obstructive sleep apnea.  6. Arthritis.  7. Status post herniorrhaphy.  8. Left total knee arthroplasty.  9. Hydrocele repair.  10.Hypertension.  11.Resection of basal cell carcinoma.   PROCEDURE:  On May 01, 2009, generator change of a BiV ICD with explant  of the existing Medtronic Maximo device and implant of the Medtronic  Concerto II BiV ICD, Dr. Hillis Range.  Defibrillator threshold study  less than or equal to 10 joules.  No apparent complications after the  procedure.  The device was explanted because it was at elective  replacement indicator.   BRIEF HISTORY:  Mr. Bilodeau is a 65 year old male.  He has a  history of  nonischemic cardiomyopathy.  He had a pacemaker implanted in 2004 after  AV node ablation.  He has been on chronic Coumadin.  He had nonischemic  cardiomyopathy with decreased ejection fraction which occasioned implant  of BiV ICD on September 08, 2005.  The patient noted auditory response  from his device and presents now for generator change since it is at  elective replacement indicator.   The patient presents electively on May 01, 2009.  He underwent the  generator change without complication by Dr. Johney Frame.  The patient will  be discharged the same day on his medications which are as follows:  1. Coumadin 7.5 mg 6 days of the week.  Coumadin 10 mg Thursday only.      The patient has 5 mg tablets.  2. Carvedilol 25 mg tablets 1 to 1-1/2 tablets twice daily.  3. Simvastatin 80 mg tablets 1/2 tablet daily at bedtime.  4. Diovan 160 mg 1/2 tablet daily.   The patient follows up at [facility], [address]  [facility] on Monday, May 11, 2009, at 9 o'clock.  He sees Dr.  Johney Frame on Friday, July 31, 2009 at 9:20.  He is asked to keep his  incision dry for the next 7 days.  He is to sponge bathe until Friday,  May 08, 2009.  In the morning of Saturday, May 02, 2009, he is to take  off the bulky bandage and leave the incision open to the air.  For the  next 2 weeks, he has been counseled to avoid strenuous activity both in  order to keep the pocket healing well and also to avoid perspiration  which might cause moistening of the incision which leads to infection.      Maple Mirza, [place]      Hillis Range, MD  Electronically Signed    GM/MEDQ  D:  05/01/2009  T:  05/02/2009  Job:  [id number]   cc:   Colleen Can. Deborah Chalk, M.D.  Kirstie Peri, MD

## 2011-02-22 NOTE — Discharge Summary (Signed)
NAME:  [name], [name] NO.:  [id number]   MEDICAL RECORD NO.:  [id number]          PATIENT TYPE:  INP   LOCATION:  1607                         FACILITY:  [facility]   PHYSICIAN:  Ollen Gross, M.D.    DATE OF BIRTH:  09-07-1946   DATE OF ADMISSION:  02/09/2009  DATE OF DISCHARGE:  02/13/2009                               DISCHARGE SUMMARY   ADMITTING DIAGNOSES:  1. Osteoarthritis right knee.  2. Hearing loss.  3. Recently diagnosed congestive heart failure.  4. Chronic atrial fibrillation.  5. Status post pacemaker defibrillator placement.  6. Reflux disease.  7. History of basal cell skin cancer.  8. Arthritis.   DISCHARGE DIAGNOSES:  1. Osteoarthritis right knee status post right total knee replacement      arthroplasty.  2. Postoperative acute blood loss anemia.  Did not require      transfusion.  3. Postop hyponatremia, improved.  4. Hearing loss.  5. Recently diagnosed congestive heart failure.  6. Chronic atrial fibrillation.  7. Status post pacemaker defibrillator placement.  8. Reflux disease.  9. History of basal cell skin cancer.  10.Arthritis.   PROCEDURE:  Feb 09, 2009 right total knee.  Surgeon Dr. Lequita Halt,  assistant Avel Peace PA-C.  Spinal anesthesia.  Tourniquet time 38  minutes.   CONSULTS:  None.   BRIEF HISTORY:  [name] is a 65 year old male with severe end-stage  arthritis of the right knee, progressive worsening pain and dysfunction  who failed operative management and now presents for a total knee  arthroplasty.   LABORATORY DATA:  Preop CBC showed hemoglobin of 14.7, hematocrit 42.7,  white cell count 5.2, platelets 227.  PT/INR 41.5 and 3.9, respectively.  Was on Coumadin.  It was rechecked prior to surgery, the day of.  His  INR was normal at 1.1.  Chem panel on admission:  Slightly low glucose  of 125.  Remaining chem panel within normal limits.  Preop UA was  negative.  Serial CBCs were followed.  Hemoglobin dropped  down to 12 and  10.4.  Last known H and H 9.2 and 25.8.  Serial protimes followed per  Coumadin protocol.  Last known PT/INR 20.8 and 1.7.  Serial BMETs were  followed.  Sodium did drop down from 140 to 131.  Got as low as 129,  back up to 136.  Remaining electrolytes remained within normal limits.  He did have a slightly elevated BNP postop of 208.  One set of cardiac  enzymes taken on Feb 11, 2009 showed normal CK of 69, normal index of  1.1, troponin normal at 0.01.   X-RAYS:  Portable chest Feb 11, 2009:  No active lung disease, stable  mild cardiomegaly with permanent pacemaker.  Preop EKG January 30, 2009:  Electronic ventricular pacing, underlying rhythm, atrial fibrillation,  __________ Dr. Nanetta Batty.  Follow-up EKG Feb 11, 2009:  Undetermined  rhythm right superior axis.  Cannot rule out anterior infarct age  undetermined, unconfirmed but appears to also be an underlying atrial  fibrillation rhythm.   HOSPITAL COURSE:  The patient admitted to [facility].  Taken  to OR.  Underwent above stated procedure without any complication.  The  patient tolerated the procedure well.  Later transferred to the recovery  room and orthopedic floor.  Started on PCA and p.o. analgesic pain  control following surgery.  Doing pretty well on the morning of day #1.  Did not get much rest.  Had a little bit of intermittent nausea, but  pain was under good control.  He actually was transferred to the  telemetry floor for postop due to his cardiac arrhythmia history.  He  did well through the night.  We spoke with cardiology services who felt  that as long as he was stable monitoring for 24 hours postop would be  sufficient.  He did well.  Was later transferred up to the orthopedic  floor that afternoon.  We did start him on a Lovenox bridge due to  chronic coumadinization.  We started back on his home meds.  On the  morning of day #2 after getting up a little bit on day #1, he started   having some left-sided chest wall discomfort which lasted a couple  hours.  By the morning rounds it had pretty much resolved.  We gave him  a little GI cocktail which provided immediate relief, but due to his  history we did check a chest x-ray which was okay.  The EKG did not show  any significant changes.  He did have 1 set of cardiac enzymes which  were normal.  Felt this was more for this reflux history.  There were no  signs of any kind of ischemic or cardiac issues.  He denied any chest  pain or shortness of breath with this, just some chest wall discomfort  on the side.  He continued to do well with this progression with his  surgery.  He started getting up, doing a little bit more on postop day  #3, and the GI cocktail resolved his issues.  He did have a drop in his  sodium though noted postop, so we DC'd his fluids once the nausea  resolved and allowed his sodium to come back up.  He had a little bit of  elevation of a BNP which has got to be due to his volume overload.  He  had some initial positive volumes, but he was diuresing well.  By day  #3, day #4, was voiding off all the extra fluids.  He continued to  progress well with physical therapy, and by postop day #4, Feb 13, 2009,  he was seen on rounds by Dr. Lequita Halt.  No complaints, progressing well.  Incision was healing well.  The dressing change was started on day #2  and checked each day.  No signs of infection.  Was discharged home.   DISCHARGE PLAN:  1. The patient discharged home on Feb 13, 2009.  2. Discharge diagnoses, please see above.  3. Discharge meds:  Ultracet, Robaxin, and Coumadin.   DIET:  Heart-healthy, cardiac diet.   ACTIVITIES:  Weightbearing as tolerated right lower extremity, total  knee protocol.  Home health PT, home health nursing.  Follow up in 2  weeks.   DISPOSITION:  Home.   CONDITION UPON DISCHARGE:  Improving.      [name], P.A.C.      Ollen Gross, M.D.   Electronically Signed    ALP/MEDQ  D:  02/13/2009  T:  02/13/2009  Job:  [id number]   cc:   Duffy Rhody  Roslynn Amble, M.D.  Fax: [phone]

## 2011-02-22 NOTE — Letter (Signed)
June 26, 2007    Colleen Can. Deborah Chalk, M.D.  [address], [address]  [place], [place] [address]   RE:  [name], [name]  MRN:  [id number]  /  DOB:  06/18/1946   Dear [name]:   [name] comes in following CRT following his AV junction ablation that  was done in [place] for uncontrolled atrial fibrillation.  He  continues not to feel all that great.  He has also has developed a new  problem where he is falling asleep during the day.  His wife says that  he does not snore.  Her first husband did.   He wonders whether it is related to his medications, and we talked about  the possibilities related to the Coreg which he now takes in generic  formulation.   PHYSICAL EXAMINATION:  His blood pressure is 112/75, his pulse was 80.  His lungs were clear.  Heart sounds were regular.  The abdomen was soft with active bowel sounds.  The extremities were without edema.   Interrogation of his Medtronic InSync [phone] ICD demonstrates a  fibrillation wave of 1.8 with an atrial impedance of 560.  There is no  intrinsic ventricular rhythm.  The right ventricular impedance was 512  with a threshold of 1 volt at 0.2.  The left ventricular impedance was  648 with a threshold 3 volts at 0.1.  Heart rate excursion was adequate.  He also has the presence of a 6949 lead and a new Integrity Alert  software was activated.   IMPRESSION:  1. Atrial fibrillation, status post AV junction ablation.  2. Left ventricular dysfunction related to #1, status post CRT      upgrade.  3. Significant fatigue, question related to carvedilol.  4. A 6949 lead, status post lead Integrity Alert software upgrade.   [name] is doing okay, [name].  We were wondering whether the fatigue  might be related to his Coreg, and he asked if we could try him on the  proprietary brand, so I have given him a prescription for Coreg SR 80 to  take for a month.  He is to see you in October, and at that point you  and he can make a  decision as to whatever makes the most sense to you.   We will see him again in 1 year's time.    Sincerely,      Thomas Salvia, MD, [facility]  Electronically Signed    SCK/MedQ  DD: 06/26/2007  DT: 06/27/2007  Job #: [id number]

## 2011-02-22 NOTE — H&P (Signed)
NAME:  [name], SHORB NO.:  [id number]   MEDICAL RECORD NO.:  [id number]          PATIENT TYPE:  INP   LOCATION:  NA                           FACILITY:  [facility]   PHYSICIAN:  Ollen Gross, M.D.    DATE OF BIRTH:  [birth year]   DATE OF ADMISSION:  02/09/2009  DATE OF DISCHARGE:                              HISTORY & PHYSICAL   CHIEF COMPLAINT:  Right knee pain   HISTORY OF PRESENT ILLNESS:  The patient 65 year old male who is seen by  Dr. Lequita Halt for ongoing knee pain.  He has end-stage arthritis in the  right knee and has been ongoing for quite some time now.  He has  undergone injections in the past which only provided temporary relief.  He has reached the point where he would like have something done about  it.  He is on chronic Coumadin for history of atrial fibrillation and  has got pacemaker defibrillator in.  He has been seen preoperatively by  Dr. Deborah Chalk and felt to be stable for surgery.  Dr. Deborah Chalk stated he  may stop his Coumadin 5 days prior to his surgery.  He will be restarted  immediately after his procedure and also undergo a Lovenox bridge  postoperatively until therapeutic.   ALLERGIES:  NIACIN causes flushing and welts.   CURRENT MEDICATIONS:  1. Diovan 80 mg daily.  2. Coumadin7.5 on 5 days and 10 mg on Monday and Thursday.  3. Crestor 5 mg daily.  4. Coreg 80 mg daily.  5. Capadex 60 mg daily.   PAST MEDICAL HISTORY:  Hearing loss, newly diagnosed congestive heart  failure, chronic atrial fibrillation status post pacemaker defibrillator  placement, reflux disease, history of basal cell skin cancer, arthritis.   PAST SURGICAL HISTORY:  Left total knee arthroplasty July 2007.  Excisions of multiple skin cancers.  He has also undergone knee  arthroscopy x3.  Eye surgery in 1992.  Tonsils and adenoids in 1962.  Double hernia repair in 2003.  Left arm surgery 1989.  Upper left leg  surgery in 1965.  Pacemaker was placed in January 2005  with additional  defibrillator placed in November 2006, and also hydrocele repair of  2005.   FAMILY HISTORY:  Father deceased age 12 history of COPD.  Mother  deceased at 9 with history of Alzheimer's.  He has two children, both  healthy.   SOCIAL HISTORY:  Married, high school education.  Past smoker, quit  about 10-12 years ago.  One to two drinks of alcohol per day, but states  no more than four so per week total.  Two children.  Spouse will be  assisting with care after surgery.  Has 3 to 4 steps entering his house.  He does have a living will, healthcare power of attorney.   REVIEW OF SYSTEMS:  GENERAL:  No fevers, chills, night sweats.  NEURO:  Little bit of tinnitus and hearing loss.  No seizures or syncope.  RESPIRATORY:  Little bit shortness breath on exertion but no shortness  breath at rest.  No productive cough or hemoptysis.  CARDIOVASCULAR:  Chest pain, and he does have chronic atrial fib.  GI:  No nausea,  diarrhea, constipation.  Does have a little bit of heartburn reflux.  GU:  No dysuria or discharge.  MUSCULOSKELETAL:  Knee pain.   PHYSICAL:  VITAL SIGNS:  Pulse 82, respirations 14, blood pressure  134/88.  GENERAL:  A 65 year old white male well-nourished, well-developed, alert  and cooperative, tall frame, good historian.  HEENT:  Normocephalic, atraumatic.  Pupils are reactive.  EOMs intact.  Known to wear glasses.  NECK:  Supple.  CHEST:  Clear anterior posterior cells anterior posterior chest walls.  No rhonchi, rales, wheezing.  HEART:  Regular rhythm.  No murmur, S1, S2.  ABDOMEN:  Soft, slightly round.  Bowel sounds present.  RECTAL, BREASTS, GENITALIA:  Not done and not pertinent to present  illness.  EXTREMITIES:  Right knee varus malalignment deformity.  Range of motion  5-115, marked crepitus, no instability.   IMPRESSION:  Osteoarthritis right knee.   PLAN:  The patient admitted [facility] undergo a right total  knee replacement  arthroplasty.  Surgery will be performed by Dr. Ollen Gross.  Dr. Delfin Edis is his cardiologist who will be notified of  the room number on admission and will be consulted if needed for any  cardiac assistance with the patient throughout the hospital course.      Alexzandrew L. Perkins, P.A.C.      Ollen Gross, M.D.  Electronically Signed    ALP/MEDQ  D:  02/05/2009  T:  02/05/2009  Job:  [id number]   cc:   Colleen Can. Deborah Chalk, M.D.  Fax: [phone]   Ollen Gross, M.D.  Fax: [phone]

## 2011-02-25 NOTE — Op Note (Signed)
NAME:  [name] NO.:  [id number]   MEDICAL RECORD NO.:  [id number]          PATIENT TYPE:  INP   LOCATION:  2899                         FACILITY:  [facility]   PHYSICIAN:  Duke Salvia, M.D.  DATE OF BIRTH:  1946-09-05   DATE OF PROCEDURE:  09/08/2005  DATE OF DISCHARGE:                                 OPERATIVE REPORT   [name] is a 65 year old gentleman with a history of atrial fibrillation.  He had failed cardioversion and underwent AV ablation and pacemaker  implantation in [place] about 1 1/2 years ago.  He has developed heart  failure and progressive LV dilatation and EF and LV dysfunction.  He is  admitted now for CRT ICD implantation and explantation of his previously  implanted pacemaker.   Following obtaining informed consent, the patient was brought to the  electrophysiology laboratory and placed on the fluoroscopy table in supine  position.  After routine prep and drape of the left upper chest, intravenous  contrast was injected via the left antecubital vein demonstrating the course  and the patency of the extrathoracic left subclavian vein.  At this point,  an incision was made at the very cephalad portion of his pectoral groove  incision and extended about 1/2 inch cephalad.  This allowed for access to  the prepectoral fascia and through this location, venipunctures were  accomplished x 3 allowing for placement of wires.  Initially, a 7 French  sheath was placed over the middle wire through which was then passed a  Medtronic 6949, 65 cm dual coil active defibrillator lead, serial number  [id number].  Under fluoroscopic guidance, it was manipulated to the right  ventricular apex somewhat distinct from the previously implanted pacemaker  lead where the bipolar sensed R wave from the patient's pacemaker was 11.7  millivolts with a pacing impedance of 1120 ohms and a threshold of 0.7 volts  at 0.5 milliseconds, current threshold was 1.2 MA, and  there was no  diaphragmatic pacing at 10 volts.  This lead was then secured to the  prepectoral fascia and over the more cephalad guide-wire, a 9.5 French  sheath was then placed through which was then passed a Medtronic MB2  coronary sinus cannulation catheter.  In conjunction with a Wholey wire, the  coronary sinus was cannulated expeditiously, venography demonstrated a  lateral branch, and then a Whisper wire in conjunction with a Medtronic  4194, 88 cm bipolar left ventricular lead, serial number [id number], was  passed easily into this vein.  In its location at the junction between the  middle and distal third, the sensed paced L-wave was 19 millivolts with a  pacing impedance of 1030 ohms and threshold 0.7 volts at 0.5 milliseconds,  current threshold 0.8 MA.  The 9.5 French sheath was removed and, at this  point, the last wire was used to place a 7 French sheath through which was  passed a Medtronic 5076, 52 cm active fixation atrial lead, serial number  [id number].  It was manipulated to the right atrial appendage where the  bipolar fibrillation wave was 0.7 millivolts  with a pacing impedance of 741  ohms.  This lead was secured to the prepectoral fascia and then the coronary  sinus delivery system was removed under fluoroscopic guidance and the lead  re-interrogated with stable parameters, this lead was then secured to the  prepectoral fascia.   At this point, attention was turned to opening up the heretofore closed  pacemaker pocket.  The pacemaker was exposed.  The right ventricular lead  was freed up from the underlying scar tissue.  The left ventricular  lead  was placed to the PSA for back up pacing.  The previously implanted  pacemaker lead, which was a Medtronic 4076, 58 cm lead, serial number  [id number], was attached to the Medtronic Maximo 7304 ICD, serial number  [id number].  Through the device, no intrinsic rhythm was identified, the  pacing impedance was 544 ohms,  and the threshold was 1 volt at 0.5  milliseconds.  At this point, the other leads were attached to the  defibrillator with the paced sense portion of the defibrillator lead capped.  Through the device, the bipolar fibrillation wave was 1.2 millivolts with a  pacing impedance of 654 ohms.  The bipolar LV impedance was 888 ohms with a  threshold of 1 volt at 0.5 milliseconds, the high voltage impedance was 46  ohms, and the proximal coil impedance was 56 ohms.   At this point, the pocket had to be expanded to allow for placement of the  defibrillator.  This having been done, the pocket was copiously irrigated  with antibiotic containing saline solution, hemostasis was assured, and the  leads and the pulse generator were then placed in the pocket and secured to  the prepectoral fascia with care taken to make sure that the capped RV sense  lead was behind the tie.  The wound was closed in three layers in a normal  fashion.  The wound was washed, dried, and a Benzoin and Steri-Strip  dressing was applied.  Needle counts, sponge counts, and instrument counts  were correct at the end of the procedure according to the staff.  The  patient tolerated the procedure without apparent complications.           ______________________________  Duke Salvia, M.D.     SCK/MEDQ  D:  09/08/2005  T:  09/08/2005  Job:  [id number]   cc:   Colleen Can. Deborah Chalk, M.D.  Fax: [phone]

## 2011-02-25 NOTE — Assessment & Plan Note (Signed)
[facility]                           ELECTROPHYSIOLOGY OFFICE NOTE   [name], [name]                       MRN:          [id number]  DATE:07/06/2006                            DOB:          1945-11-07    [name] is seen in the device clinic on July 06, 2006, for routine  followup of his Medtronic Maximo, model 7304, dual chamber biventricular  defibrillator, implanted November 2006, for congestive failure.   Upon interrogation, there are no episodes stored in the device.  He is in  atrial fibrillation.  Battery voltage measures 3.11 with the last recorded  charge time of 7.54 seconds.  In the atrium intrinsic amplitude is 0.9  milliseconds with an impedance of 576 ohms.  He is dependent to a rate of 30  in the ventricle.  In the right ventricle impedance is 520 ohms with a  threshold of 1.0 volts at 0.2 milliseconds and in the left ventricle  impedance is 752 ohms with a threshold of 2.0 volts at 0.2 milliseconds.  High voltage lead impedance is 48 ohms.  The RV amplitude was decreased to  2.5 volts and LV was decreased to 3 volts allowing for adequate safety  margins.   He will do [facility] transmissions in 3, 6, and 9 months' time and be seen by  Dr. Graciela Husbands in 1 year's time.      ______________________________  Cleatrice Burke, RN    ______________________________  Duke Salvia, MD, [facility]    CF/MedQ  DD:  07/06/2006  DT:  07/08/2006  Job #:  [id number]

## 2011-02-25 NOTE — Discharge Summary (Signed)
NAME:  [name], [name] NO.:  [id number]   MEDICAL RECORD NO.:  [id number]          PATIENT TYPE:  INP   LOCATION:  2902                         FACILITY:  [facility]   PHYSICIAN:  Duke Salvia, M.D.  DATE OF BIRTH:  Nov 01, 1945   DATE OF ADMISSION:  09/08/2005  DATE OF DISCHARGE:  09/09/2005                                 DISCHARGE SUMMARY   DISCHARGING PHYSICIAN AND CARDIOLOGIST:  Duke Salvia, M.D.   MAIN CARDIOLOGIST:  Colleen Can. Deborah Chalk, M.D.   ALLERGIES:  THE PATIENT IS ALLERGIC TO NIACIN.   Procedures performed during this hospitalization include a pacemaker implant  and an EP study.   PRINCIPAL DIAGNOSIS:  The patient has a history of atrial fibrillation with  heart failure and progressive left ventricular dilatation and ejection  fraction and left ventricular dysfunction.  The patient was admitted for  CRT/ICD implant.   SECONDARY DIAGNOSES:  1.  Gastroesophageal reflux disease.  2.  Arthritis.  3.  Skin cancer.   SURGERIES:  Include knee surgery times four, herniorrhaphy and eye surgery.   HISTORY OF PRESENT ILLNESS:  The patient was admitted on September 08, 2005  for an EP study done by Dr. Sherryl Manges.  The plan at that time was to  extract a pacemaker and implant a BiV-ICD to replace his VVI pacemaker for  his cardiomyopathy.  The patient had a successful implant of a Medtronic  InSync Maximo 7304.  He had an atrial lead of 5076 CapSureFix Novus, RV/SVC  Medtronic lead 6940 Sprint Fidelis, and a CS Medtronic 4194 Attain Bipolar  lead implanted.   The patient did well and on September 09, 2005, Dr. Graciela Husbands evaluated the  patient.  Stable for discharge to home.   Interrogation of the device showed a battery voltage of 3.13 volts, last  full energy charge of 8.23 seconds, EGM amplitude in atrial lead was 0.9  millivolts.  Pacing impedance was 528 ohms.  RV was 528 ohms.  Defib  impedance was 41 ohms.  SVC impedance was 52 ohms.  Pacing  impedance was 696  ohms in the LV.  Pacing mode was a VVIR with a lower rate of 70 ppm and an  upper sensing rate of 120 ppm.  Voltage in ventricle was 0.5 millivolts.   FOLLOW-UP LABORATORIES:  The patient will have a BMP on September 21, 2005.  He will also be followed up in the pacemaker clinic September 21, 2005 at  nine o'clock.  The patient will also be seen by Dr. Graciela Husbands in three months.  They will call with that date at that time.  The patient will also be seen  by Dr. Deborah Chalk.  He is to call and have an appointment within three weeks.   DISCHARGE MEDICATIONS:  1.  Carvedilol 25 mg p.o. b.i.d.  2.  Lisinopril 10 mg p.o. every day.  3.  Coumadin 10 mg every day.   Plan is to also consider Tikosyn with associated cardioversion in four to  six weeks.   DISCHARGE LABORATORIES:  Include a white blood cell count of 4.3, hematocrit  of 43.4, hemoglobin 15, platelet count of 267.  Protime of 19.5, INR of 1.6.  Sodium 142, potassium 4.5, chloride 106, BUN 14, creatinine 1.1, calcium  9.1.   The patient was given a discharge instructions sheet with limited activity  for six to eight weeks.  The patient was instructed to raise his arm above  his head for a week, no driving for seven days and no showers for seven  days.  The patient was instructed to eat a heart healthy diet with a low  fat, low cholesterol diet.     ______________________________  April Humphrey, NP    ______________________________  Duke Salvia, M.D.    AH/MEDQ  D:  09/09/2005  T:  09/09/2005  Job:  [id number]   cc:   Colleen Can. Deborah Chalk, M.D.  Fax: [phone]

## 2011-02-25 NOTE — Op Note (Signed)
[facility]  Patient:    [name], [name] Visit Number: [id number] MRN: [id number]          Service Type: DSU Location: DAY Attending Physician:  Andre Lefort Dictated by:   Sandria Bales. Ezzard Standing, M.D. Proc. Date: 12/07/01 Admit Date:  12/07/2001   CC:         Sonda Primes, M.D. [facility]   Operative Report  PREOPERATIVE DIAGNOSIS:  Bilateral inguinal hernias.  POSTOPERATIVE DIAGNOSIS:  Bilateral direct inguinal hernias with bilateral small indirect inguinal hernias.  PROCEDURE:  Laparoscopic bilateral inguinal herniorrhaphy, with double mesh on the left side and single onlay mesh on the right side.  SURGEON:  Sandria Bales. Ezzard Standing, M.D.  ANESTHESIA:  General.  ESTIMATED BLOOD LOSS:  Minimal.  INDICATIONS FOR PROCEDURE:  Mr. On is a 65 year old male who has symptomatic bilateral inguinal hernias.  On physical examination his left inguinal hernia is larger than his right, but his right is more symptomatic.  He now comes for attempted laparoscopic inguinal hernia repairs.  OPERATIVE NOTE:  The patient placed in the supine position, given a general intravenous anesthetic.  He had a Foley catheter placed and was given 1 g of Ancef at beginning of procedure.  His arms were tucked to his sides.  His lower abdomen was shaved and prepped with Betadine solution and sterilely draped.  I made an infraumbilical incision with sharp dissection carried down to the rectus abdominis fascia.  Because his larger hernia was on the left, I went to the left side through the anterior rectus fascia on the left.  Retracted the rectus abdominis muscle anteriorly and placed the PBD balloon into the preperitoneal space and distended this under direct laparoscopic visualization.  I had pretty good distention with the balloon, dissecting out both sides.  I then widened the space with the laparoscope and inserted two 5 mm trocars at the proximal level of the anterior iliac  spine, along the mid clavicular line.  I then dissected out the left side first, despining pubic tubercle, Coopers ligaments, inguinal floor and the lateral aspect of the inguinal floor.  The patient had a fairly large direct inguinal hernia, and this is what I think I was finding on physical examination.  I identified the cord structures, and he also actually had a small indirect inguinal hernia, which was going up the cord structures.  Also we reduced a lipoma of the cord.  I reduced down the peritoneum to at least the level of the anterior iliac spine.  We then turned our attention to the right side, where I found a similar hernia lateral in the inguinal floor -- which will be consistent with a direct hernia on the right side, but was somewhat smaller than on the left side.  He also had evidence of an indirect inguinal hernia on the right side where peritoneum was going up the cord structures.  Again, I elevated this to the level of the anterior iliac spine.  I then placed a precut Atrium mesh on the left side, because of a kind of a patchulousness of his internal ring.  I stapled this with staples through the pubic bone medially, to Coopers ligament inferiorly to transversalis fascia superiorly, and then above and lateral to above the iliopubic tract laterally.  I then placed an onlay mesh 4 x 6 inches on the right side; again placing staples to the pubic bone medially, to Coopers ligament inferiorly, transversalis fascia superiorly and above the iliopubic tract laterally. Because again  the size of the hernia defect on the left side, I put a second piece of onlay mesh.  This, again, was approximately 4 x 6 inches in size; then stapled over the first mesh, again to the pubic bone medially, to Coopers ligament inferiorly, transversalis fascia superiorly.  The mesh lay flat.  I thought I had both inguinal defects well covered.  He had no evidence of bleeding at end of procedure.  I  removed the laparoscopes without any bleeding at the trocar site.  The anterior rectus fascia was closed with a 0 Vicryl suture.  The skin at each site was closed with a 5-0 Vicryl suture.  Covered with Benzoin Steri-Strips and sterile dressing.  The patient tolerated the procedure well and will be discharged home today. He is to return to see me in 10-14 days for follow-up. Dictated by:   Sandria Bales. Ezzard Standing, M.D. Attending Physician:  Andre Lefort DD:  12/07/01 TD:  12/08/01 Job: [id number]

## 2011-02-28 ENCOUNTER — Encounter: Payer: Self-pay | Admitting: *Deleted

## 2011-03-02 NOTE — Progress Notes (Signed)
icd remote check  

## 2011-04-12 ENCOUNTER — Ambulatory Visit (HOSPITAL_BASED_OUTPATIENT_CLINIC_OR_DEPARTMENT_OTHER): Payer: PRIVATE HEALTH INSURANCE | Attending: Internal Medicine

## 2011-04-12 DIAGNOSIS — G4710 Hypersomnia, unspecified: Secondary | ICD-10-CM | POA: Insufficient documentation

## 2011-04-12 DIAGNOSIS — Z79899 Other long term (current) drug therapy: Secondary | ICD-10-CM | POA: Insufficient documentation

## 2011-04-12 DIAGNOSIS — Z7901 Long term (current) use of anticoagulants: Secondary | ICD-10-CM | POA: Insufficient documentation

## 2011-04-16 DIAGNOSIS — Z7901 Long term (current) use of anticoagulants: Secondary | ICD-10-CM

## 2011-04-16 DIAGNOSIS — G4710 Hypersomnia, unspecified: Secondary | ICD-10-CM

## 2011-04-16 DIAGNOSIS — G4730 Sleep apnea, unspecified: Secondary | ICD-10-CM

## 2011-04-16 DIAGNOSIS — Z79899 Other long term (current) drug therapy: Secondary | ICD-10-CM

## 2011-04-16 NOTE — Procedures (Signed)
NAME:  Thomas, Ibarra NO.:  [id number]  MEDICAL RECORD NO.:  [id number]          PATIENT TYPE:  OUT  LOCATION:  SLEEP CENTER                 FACILITY:  [facility]  PHYSICIAN:  Nikeisha Klutz D. Maple Hudson, MD, FCCP, FACPDATE OF BIRTH:  1946-10-02  DATE OF STUDY:  04/12/2011                           NOCTURNAL POLYSOMNOGRAM  REFERRING PHYSICIAN:  W DOUGLAS SHAW  INDICATION FOR STUDY:  Hypersomnia with sleep apnea.  EPWORTH SLEEPINESS SCORE:  20/24, BMI 31.4.  Weight 265 pounds, height 77 inches.  Neck 17.5 inches.  MEDICATIONS:  Home medications are charted and reviewed.  Outside records described in NPSG of January 08, 2009, with AHI 38.3 per hour.  CPAP titration is now requested.  SLEEP ARCHITECTURE:  Total sleep time 274.5 minutes with sleep efficiency 64.9%.  Stage I was 8.7%, stage II 69.8%, stage III absent, REM 21.5% of total sleep time.  Sleep latency 18 minutes, REM latency 70 minutes, awake after sleep onset 132 minutes, arousal index 20.3.  BEDTIME MEDICATION:  Valsartan, warfarin, simvastatin and carvedilol.  RESPIRATORY DATA:  CPAP titration protocol.  CPAP was titrated from 5 to final pressure of 10 CWP, AHI 0 per hour.  He wore a medium ResMed Mirage Quattro full-face mask with heated humidifier.  OXYGEN DATA:  Snoring was prevented by CPAP with mean oxygen saturation 94.2% on room air.  CARDIAC DATA:  Paced rhythm averaging 61 beats per minute.  MOVEMENT-PARASOMNIA:  No significant movement disturbance.  Bathroom x1.  IMPRESSIONS-RECOMMENDATIONS: 1. Successful CPAP titration to 10 CWP, AHI 0 per hour.  He wore a     medium ResMed Mirage Quattro full-face mask with heated humidifier.     Successful prevention of snoring and normal oxygen saturation at     94.2% mean.     He wore a medium ResMed Mirage Quattro full-face mask with heated     humidifier. 2. Baseline diagnostic NPSG on January 08, 2009, had recorded an HPI of     38.3 per hour.     Cage Gupton  D. Maple Hudson, MD, [facility], FACP Diplomate, Biomedical engineer of Sleep Medicine Electronically Signed    CDY/MEDQ  D:  04/16/2011 12:47:48  T:  04/16/2011 22:44:49  Job:  [id number]

## 2011-05-13 ENCOUNTER — Encounter: Payer: Self-pay | Admitting: Internal Medicine

## 2011-05-13 ENCOUNTER — Ambulatory Visit (INDEPENDENT_AMBULATORY_CARE_PROVIDER_SITE_OTHER): Payer: PRIVATE HEALTH INSURANCE | Admitting: Internal Medicine

## 2011-05-13 VITALS — BP 116/78 | HR 64 | Ht 77.0 in | Wt 271.8 lb

## 2011-05-13 DIAGNOSIS — G4733 Obstructive sleep apnea (adult) (pediatric): Secondary | ICD-10-CM

## 2011-05-13 MED ORDER — TEMAZEPAM 15 MG PO CAPS: 15.0000 mg | ORAL_CAPSULE | Freq: Every evening | ORAL | Status: DC | PRN

## 2011-05-13 NOTE — Patient Instructions (Signed)
Order- [facility]- new CPAP 10 cwp, mask of choice, humidifier  Dx OSA  Script Temazepam 1-2 for sleep apnea

## 2011-05-13 NOTE — Progress Notes (Signed)
Subjective:    Patient ID: [name], male    DOB: Sep 30, 1946, 65 y.o.   MRN: [id number]  HPI 05/13/11- 65 year old man, former smoker, referred courtesy of Dr. Eric Form for sleep medicine evaluation because of sleep apnea. An NPSG 01/08/09 documented  Severe sleep apnea, AHI 38.3/ hr. He tried CPAP briefly, but was unable to tolerate it because of claustrophobia. Since then he has been bothered by persistent tiredness and decided to look into the issue again. His friends have told him that he should be on CPAP. His wife is never complained of his snoring or described apnea. He now feels "worn out". 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 He admits to daytime sleepiness while driving. A recent CPAP titration study on 04/12/2011 showed good control on CPAP at 10 CWP with an AHI of 0 per hour. Caffeine is no help. Usual bedtime 11 PM estimating sleep latency 30-45 minutes. He will wake briefly 2 or 3 times during the night with nocturia x1. Finally up between 5:30 and 6:30 AM. Past history of atrial fibrillation with defibrillator/pacemaker. He denies history of myocardial infarction, lung or thyroid disease.  Review of Systems  Constitutional: Positive for unexpected weight change.  Respiratory: Positive for shortness of breath.   Cardiovascular: Positive for palpitations.  Gastrointestinal:       Indegestion  Musculoskeletal: Positive for myalgias.  Constitutional:   No-   weight loss, night sweats, fevers, chills, fatigue, lassitude. HEENT:   No-   headaches, difficulty swallowing, tooth/dental problems, sore throat,                  No-   sneezing, itching, ear ache, nasal congestion, post nasal drip,   CV:  No-   chest pain, orthopnea, PND, swelling in lower extremities, anasarca, dizziness,   GI:    abdominal pain, nausea, vomiting, diarrhea,     + food hang-up mid-esophagus, Dr Marina Goodell                change in bowel habits, loss of  appetite  Resp: .  No-  excess mucus,             No-   productive cough,  No non-productive cough,  No-  coughing up of blood.              No-   change in color of mucus.  No- wheezing.    Skin: No-   rash or lesions.  GU: No-   dysuria, change in color of urine, no urgency or frequency.  No- flank pain.  MS:  No-   joint pain or swelling.  No- decreased range of motion.  No- back pain.  Psych:  No- change in mood or affect. No depression or anxiety.  No memory loss.       Objective:   Physical Exam General- Alert, Oriented, Affect-appropriate, Distress- none acute  Tall, not fat Skin- rash-none, lesions- none, excoriation- none Lymphadenopathy- none Head- atraumatic            Eyes- Gross vision intact, PERRLA, conjunctivae clear secretions            Ears- Hearing, canals normal            Nose- Clear, No- Septal dev, mucus, polyps, erosion, perforation             Throat- Mallampati II , mucosa clear , drainage- none, tonsils-  atrophic  Repair with partial bridge work Neck- flexible , trachea midline, no stridor , thyroid nl, carotid no bruit Chest - symmetrical excursion , unlabored           Heart/CV- RRR ( paced) , no murmur , no gallop  , no rub, nl s1 s2                           - JVD- none , edema- none, stasis changes- none, varices- none           Lung- clear to P&A, wheeze- none, cough- none , dullness-none, rub- none           Chest wall-  Abd- tender-no, distended-no, bowel sounds-present, HSM- no Br/ Gen/ Rectal- Not done, not indicated Extrem- cyanosis- none, clubbing, none, atrophy- none, strength- nl Neuro- grossly intact to observation         Assessment & Plan:

## 2011-05-13 NOTE — Assessment & Plan Note (Addendum)
We discussed options, medical consequences of sleep apnea, sleep hygiene, and his responsibility to drive safely. We will start by letting him retry CPAP at 10 cwp, probably with a low profile mask/ nasal pillows, and temazepam to help with the adjustment.

## 2011-05-18 ENCOUNTER — Encounter: Payer: Self-pay | Admitting: Internal Medicine

## 2011-05-26 ENCOUNTER — Encounter: Payer: PRIVATE HEALTH INSURANCE | Admitting: *Deleted

## 2011-05-30 ENCOUNTER — Encounter: Payer: Self-pay | Admitting: *Deleted

## 2011-06-03 ENCOUNTER — Encounter: Payer: Self-pay | Admitting: Internal Medicine

## 2011-06-03 ENCOUNTER — Other Ambulatory Visit: Payer: Self-pay

## 2011-06-03 ENCOUNTER — Ambulatory Visit (INDEPENDENT_AMBULATORY_CARE_PROVIDER_SITE_OTHER): Payer: PRIVATE HEALTH INSURANCE | Admitting: *Deleted

## 2011-06-03 DIAGNOSIS — I428 Other cardiomyopathies: Secondary | ICD-10-CM

## 2011-06-03 DIAGNOSIS — I509 Heart failure, unspecified: Secondary | ICD-10-CM

## 2011-06-06 LAB — REMOTE ICD DEVICE
AL AMPLITUDE: 0.5 mv
AL AMPLITUDE: 0.5 mv
AL IMPEDENCE ICD: 551 Ohm
ATRIAL PACING ICD: 0 pct
ATRIAL PACING ICD: 0 pct
BRDY-0002LV: 60 {beats}/min
BRDY-0004LV: 120 {beats}/min
BRDY-0004LV: 120 {beats}/min
CHARGE TIME: 10.61 s
FVT: 0
FVT: 0
LV LEAD IMPEDENCE ICD: 779 Ohm
LV LEAD THRESHOLD: 0.75 V
RV LEAD IMPEDENCE ICD: 627 Ohm
TOT-0001: 1
TOT-0002: 0
TZAT-0001ATACH: 1
TZAT-0001ATACH: 1
TZAT-0001ATACH: 2
TZAT-0001FASTVT: 1
TZAT-0001FASTVT: 1
TZAT-0002ATACH: NEGATIVE
TZAT-0002ATACH: NEGATIVE
TZAT-0002ATACH: NEGATIVE
TZAT-0002ATACH: NEGATIVE
TZAT-0002FASTVT: NEGATIVE
TZAT-0004SLOWVT: 8
TZAT-0004SLOWVT: 8
TZAT-0005SLOWVT: 88 pct
TZAT-0005SLOWVT: 88 pct
TZAT-0005SLOWVT: 91 pct
TZAT-0005SLOWVT: 91 pct
TZAT-0011SLOWVT: 10 ms
TZAT-0011SLOWVT: 10 ms
TZAT-0011SLOWVT: 10 ms
TZAT-0011SLOWVT: 10 ms
TZAT-0012ATACH: 150 ms
TZAT-0012ATACH: 150 ms
TZAT-0012FASTVT: 200 ms
TZAT-0012SLOWVT: 200 ms
TZAT-0012SLOWVT: 200 ms
TZAT-0012SLOWVT: 200 ms
TZAT-0013SLOWVT: 2
TZAT-0013SLOWVT: 2
TZAT-0013SLOWVT: 2
TZAT-0013SLOWVT: 2
TZAT-0018ATACH: NEGATIVE
TZAT-0018ATACH: NEGATIVE
TZAT-0018ATACH: NEGATIVE
TZAT-0018ATACH: NEGATIVE
TZAT-0018FASTVT: NEGATIVE
TZAT-0018FASTVT: NEGATIVE
TZAT-0018SLOWVT: NEGATIVE
TZAT-0018SLOWVT: NEGATIVE
TZAT-0018SLOWVT: NEGATIVE
TZAT-0019ATACH: 6 V
TZAT-0019ATACH: 6 V
TZAT-0019ATACH: 6 V
TZAT-0019FASTVT: 8 V
TZAT-0019SLOWVT: 8 V
TZAT-0020ATACH: 1.5 ms
TZON-0003ATACH: 350 ms
TZON-0003FASTVT: 240 ms
TZON-0003SLOWVT: 350 ms
TZON-0004SLOWVT: 24
TZON-0004SLOWVT: 24
TZON-0004VSLOWVT: 28
TZON-0005SLOWVT: 12
TZON-0005SLOWVT: 12
TZST-0001ATACH: 4
TZST-0001ATACH: 5
TZST-0001FASTVT: 2
TZST-0001FASTVT: 3
TZST-0001FASTVT: 3
TZST-0001FASTVT: 6
TZST-0001SLOWVT: 3
TZST-0001SLOWVT: 4
TZST-0001SLOWVT: 4
TZST-0001SLOWVT: 6
TZST-0001SLOWVT: 6
TZST-0002ATACH: NEGATIVE
TZST-0002ATACH: NEGATIVE
TZST-0002FASTVT: NEGATIVE
TZST-0002FASTVT: NEGATIVE
TZST-0002FASTVT: NEGATIVE
TZST-0002FASTVT: NEGATIVE
TZST-0002FASTVT: NEGATIVE
TZST-0002FASTVT: NEGATIVE
TZST-0003SLOWVT: 35 J
TZST-0003SLOWVT: 35 J
TZST-0003SLOWVT: 35 J
TZST-0003SLOWVT: 35 J
VENTRICULAR PACING ICD: 99.76 pct
VF: 0

## 2011-06-14 ENCOUNTER — Encounter: Payer: Self-pay | Admitting: *Deleted

## 2011-06-21 NOTE — Progress Notes (Signed)
ICD/ICM checked by remote. 

## 2011-06-24 ENCOUNTER — Ambulatory Visit (INDEPENDENT_AMBULATORY_CARE_PROVIDER_SITE_OTHER): Payer: PRIVATE HEALTH INSURANCE | Admitting: Internal Medicine

## 2011-06-24 ENCOUNTER — Encounter: Payer: Self-pay | Admitting: Internal Medicine

## 2011-06-24 VITALS — BP 110/70 | HR 70 | Ht 77.0 in | Wt 271.2 lb

## 2011-06-24 DIAGNOSIS — Z23 Encounter for immunization: Secondary | ICD-10-CM

## 2011-06-24 DIAGNOSIS — G4733 Obstructive sleep apnea (adult) (pediatric): Secondary | ICD-10-CM

## 2011-06-24 NOTE — Progress Notes (Signed)
Subjective:    Patient ID: [name], male    DOB: 29-Nov-1945, 65 y.o.   MRN: [id number]  HPI    Review of Systems     Objective:   Physical Exam        Assessment & Plan:   Subjective:    Patient ID: [name], male    DOB: April 17, 1946, 65 y.o.   MRN: [id number]  HPI 05/13/11- 5 year old man, former smoker, referred courtesy of Dr. Eric Form for sleep medicine evaluation because of sleep apnea. An NPSG 01/08/09 documented  Severe sleep apnea, AHI 38.3/ hr. He tried CPAP briefly, but was unable to tolerate it because of claustrophobia. Since then he has been bothered by persistent tiredness and decided to look into the issue again. His friends have told him that he should be on CPAP. His wife has never complained of his snoring or described apnea. He now feels "worn out". 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 He admits to daytime sleepiness while driving. A recent CPAP titration study on 04/12/2011 showed good control on CPAP at 10 CWP with an AHI of 0 per hour. Caffeine is no help. Usual bedtime 11 PM estimating sleep latency 30-45 minutes. He will wake briefly 2 or 3 times during the night with nocturia x1. Finally up between 5:30 and 6:30 AM. Past history of atrial fibrillation with defibrillator/pacemaker. He denies history of myocardial infarction, lung or thyroid disease.  06/24/11- 65 yoM followed for OSA/ hypersomnia complicated by AFib/ pacemaker/ MI He is retrying CPAP 10/ Advanced and finds it a lot better this time around. He is averaging 51/2 hours use/ night. He notes much less daytime sleepiness. Used temazepam only 2-3 times, mainly if wound up at bedtime. He made up his mind he would make this work. Compliance and control are good.    Review of Systems  Constitutional: Positive for unexpected weight change.  Respiratory: Positive for shortness of breath.   Cardiovascular: Positive for palpitations.    Gastrointestinal:  Musculoskeletal: Positive for myalgias.  Constitutional:   No-   weight loss, night sweats, fevers, chills, fatigue, lassitude. HEENT:   No-   headaches, difficulty swallowing, tooth/dental problems, sore throat,                  No-   sneezing, itching, ear ache, nasal congestion, post nasal drip,  CV:  No-   chest pain, orthopnea, PND, swelling in lower extremities, anasarca, dizziness,   GI:    abdominal pain, nausea, vomiting, diarrhea,     + food hang-up mid-esophagus, Dr Marina Goodell.                change in bowel habits, loss of appetite Resp: .  No-  excess mucus,             No-   productive cough,  No non-productive cough,  No-  coughing up of blood.              No-   change in color of mucus.  No- wheezing.   Skin: No-   rash or lesions. GU: No-   dysuria, change in color of urine, no urgency or frequency.  No- flank pain. MS:  No-   joint pain or swelling.  No- decreased range of motion.  No- back pain. Psych:  No- change in mood or affect. No depression or anxiety.  No memory loss.  Objective:   Physical Exam General- Alert, Oriented, Affect-appropriate, Distress- none acute  Tall, not fat Skin- rash-none, lesions- none, excoriation- none Lymphadenopathy- none Head- atraumatic            Eyes- Gross vision intact, PERRLA, conjunctivae clear secretions            Ears- Hearing, canals normal            Nose- Clear, No- Septal dev, mucus, polyps, erosion, perforation             Throat- Mallampati II-III , mucosa clear , drainage- none, tonsils- atrophic  Repair with partial bridge work Neck- flexible , trachea midline, no stridor , thyroid nl, carotid no bruit Chest - symmetrical excursion , unlabored           Heart/CV- RRR ( paced) , no murmur , no gallop  , no rub, nl s1 s2                           - JVD- none , edema- none, stasis changes- none, varices- none           Lung- clear to P&A, wheeze- none, cough- none , dullness-none, rub- none            Chest wall-  Abd- tender-no, distended-no, bowel sounds-present, HSM- no Br/ Gen/ Rectal- Not done, not indicated Extrem- cyanosis- none, clubbing, none, atrophy- none, strength- nl, bilateral knee surgery scars. Neuro- grossly intact to observation         Assessment & Plan:

## 2011-06-24 NOTE — Patient Instructions (Signed)
You are doing well with the CPAP.  Please call as needed  Flu vax

## 2011-06-24 NOTE — Assessment & Plan Note (Signed)
Good compliance and control now. He has the right attitude to make this work.

## 2011-09-05 ENCOUNTER — Ambulatory Visit (INDEPENDENT_AMBULATORY_CARE_PROVIDER_SITE_OTHER): Payer: PRIVATE HEALTH INSURANCE | Admitting: Cardiology

## 2011-09-05 ENCOUNTER — Encounter: Payer: Self-pay | Admitting: Cardiology

## 2011-09-05 DIAGNOSIS — I4891 Unspecified atrial fibrillation: Secondary | ICD-10-CM

## 2011-09-05 DIAGNOSIS — I5032 Chronic diastolic (congestive) heart failure: Secondary | ICD-10-CM | POA: Insufficient documentation

## 2011-09-05 DIAGNOSIS — I509 Heart failure, unspecified: Secondary | ICD-10-CM

## 2011-09-05 DIAGNOSIS — E7800 Pure hypercholesterolemia, unspecified: Secondary | ICD-10-CM

## 2011-09-05 DIAGNOSIS — I428 Other cardiomyopathies: Secondary | ICD-10-CM | POA: Insufficient documentation

## 2011-09-05 MED ORDER — WARFARIN SODIUM 5 MG PO TABS: ORAL_TABLET | ORAL | Status: DC

## 2011-09-05 MED ORDER — SIMVASTATIN 40 MG PO TABS: 40.0000 mg | ORAL_TABLET | Freq: Every evening | ORAL | Status: DC

## 2011-09-05 MED ORDER — VALSARTAN 80 MG PO TABS: 80.0000 mg | ORAL_TABLET | Freq: Every day | ORAL | Status: DC

## 2011-09-05 MED ORDER — CARVEDILOL 25 MG PO TABS: ORAL_TABLET | ORAL | Status: DC

## 2011-09-05 NOTE — Progress Notes (Signed)
PCP: Dr. Clelia Croft  65 yo with history of chronic atrial fibrillation and nonischemic cardiomyopathy presents for cardiology followup.  [name] has been seen by Dr. Deborah Ibarra in the past and is seen by me for the first time today.  On last echo in 8/11, LV EF had improved to 50-55%.  He has had an AV nodal ablation and has a Medtronic CRT-D system in place.  He has been doing well recently.  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  Main limitation is from knee pain (both knees have been replaced.  He is able to walk on flat ground with no dyspnea.  Mild shortness of breath with walking up hills or steps.  No chest pain.  No PND.  He has OSA and uses CPAP.  This has helped a lot with daytime sleepiness and fatigue.    ECG: BiV-paced, atrial rhythm is atrial fibrillation.   PMH: 1. OSA on CPAP 2. Chronic atrial fibrillation: AV nodal ablation on 1/05.  3. Nonischemic cardiomyopathy: Normal cath in 2004.  Stress test in 2009 at [facility] normal.  Last echo in 8/11 showed recovery of LV systolic function: EF 50-55%.  Patient has a Medtronic BiV-ICD.   4. Dysphagia 5. Hyperlipidemia 6. HTN 7. GERD 8. Basal cell carcinoma s/p excision 9. Bilateral inguinal hernia repairs 10. Left TKR in 2007, redo right TKR in 11/10.   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    FH: Mother with Alzheimers  ROS: All systems reviewed and negative except as per HPI.   Current Outpatient Prescriptions  Medication Sig Dispense Refill  . temazepam (RESTORIL) 15 MG capsule Take 15 mg by mouth at bedtime as needed.        Marland Kitchen DISCONTD: carvedilol (COREG) 25 MG tablet Take 37.5 mg by mouth 2 (two) times daily with a meal.        . carvedilol (COREG) 25 MG tablet Take 1 and 1/2 tablets twice a day  270 tablet  3  . simvastatin (ZOCOR) 40 MG tablet Take 1 tablet (40 mg total) by mouth every evening.  90 tablet  3  . valsartan (DIOVAN) 80 MG tablet Take  1 tablet (80 mg total) by mouth daily.  90 tablet  3  . warfarin (COUMADIN) 5 MG tablet Take 1 and 1/2 daily or as directed  45 tablet  0   Current Facility-Administered Medications  Medication Dose Route Frequency Provider Last Rate Last Dose  . 0.9 %  sodium chloride infusion  500 mL Intravenous Continuous Yancey Flemings, MD        BP 121/84  Pulse 73  Ht 6\' 5"  (1.956 m)  Wt 121.927 kg (268 lb 12.8 oz)  BMI 31.88 kg/m2 General: NAD Neck: No JVD, no thyromegaly or thyroid nodule.  Lungs: Clear to auscultation bilaterally with normal respiratory effort. CV: Nondisplaced PMI.  Heart regular S1/S2, no S3/S4, no murmur.  No peripheral edema.  No carotid bruit.  Normal pedal pulses.  Abdomen: Soft, nontender, no hepatosplenomegaly, no distention.  Neurologic: Alert and oriented x 3.  Psych: Normal affect. Extremities: No clubbing or cyanosis.

## 2011-09-05 NOTE — Assessment & Plan Note (Signed)
Patient has stable mild exertional dyspnea (NYHA class II).  Last echo in 8/11 showed EF improved to 50-55%.  He has a CRT-D device.  I would have him continue current dose of Coreg at 37.5 mg bid.  He is also on valsartan 80 mg daily.  He was told to lower the dose to 40 mg at the [place] but denies symptomatic hypotension and is not sure why they wanted him to lower the dose.  He continues to take valsartan 80 mg daily.  I think it is fine to continue valsartan at 80 mg daily.

## 2011-09-05 NOTE — Patient Instructions (Signed)
Your physician recommends that you schedule a follow-up appointment in: the coumadin clinic .  Your physician wants you to follow-up in: 1 year with Dr Shirlee Latch . (November 2013). You will receive a reminder letter in the mail two months in advance. If you don't receive a letter, please call our office to schedule the follow-up appointment.

## 2011-09-05 NOTE — Assessment & Plan Note (Addendum)
I will get most recent labs including cholesterol from his primary MD.

## 2011-09-05 NOTE — Assessment & Plan Note (Signed)
Continue warfarin, goal INR 2-3.  He is tolerating coumadin without any problems and stable INR, so does not need to change to Pradaxa or Xarelto unless he expresses the preference.

## 2011-09-14 ENCOUNTER — Ambulatory Visit (INDEPENDENT_AMBULATORY_CARE_PROVIDER_SITE_OTHER): Payer: PRIVATE HEALTH INSURANCE | Admitting: *Deleted

## 2011-09-14 ENCOUNTER — Telehealth: Payer: Self-pay | Admitting: *Deleted

## 2011-09-14 DIAGNOSIS — I509 Heart failure, unspecified: Secondary | ICD-10-CM

## 2011-09-14 DIAGNOSIS — I4891 Unspecified atrial fibrillation: Secondary | ICD-10-CM

## 2011-09-14 DIAGNOSIS — Z7901 Long term (current) use of anticoagulants: Secondary | ICD-10-CM

## 2011-09-14 DIAGNOSIS — I5032 Chronic diastolic (congestive) heart failure: Secondary | ICD-10-CM

## 2011-09-14 NOTE — Telephone Encounter (Signed)
I had received correspondence from [employer] [phone]) ,administrator of pt's prescription drug plan, that Diovan was not on pt's preferred medication list. [employer]  was requesting prior authorization for Diovan. They were requesting change to either losartan or irbesartan. Dr Shirlee Latch prefers pt continue Diovan.  I talked with pt and he is able to get Diovan  through the [place]. Pt states he currently has Diovan (valsartan) and has a current prescription for Diovan (valsartan) at the [place]. Pt is to notify me if he is unable to continue to get this medication.

## 2011-09-16 ENCOUNTER — Encounter: Payer: Self-pay | Admitting: Internal Medicine

## 2011-09-23 ENCOUNTER — Ambulatory Visit (INDEPENDENT_AMBULATORY_CARE_PROVIDER_SITE_OTHER): Payer: PRIVATE HEALTH INSURANCE | Admitting: Internal Medicine

## 2011-09-23 ENCOUNTER — Encounter: Payer: Self-pay | Admitting: Internal Medicine

## 2011-09-23 ENCOUNTER — Ambulatory Visit (INDEPENDENT_AMBULATORY_CARE_PROVIDER_SITE_OTHER): Payer: PRIVATE HEALTH INSURANCE | Admitting: *Deleted

## 2011-09-23 DIAGNOSIS — I5032 Chronic diastolic (congestive) heart failure: Secondary | ICD-10-CM

## 2011-09-23 DIAGNOSIS — I4891 Unspecified atrial fibrillation: Secondary | ICD-10-CM

## 2011-09-23 DIAGNOSIS — I509 Heart failure, unspecified: Secondary | ICD-10-CM

## 2011-09-23 DIAGNOSIS — I428 Other cardiomyopathies: Secondary | ICD-10-CM

## 2011-09-23 DIAGNOSIS — Z7901 Long term (current) use of anticoagulants: Secondary | ICD-10-CM

## 2011-09-23 DIAGNOSIS — I442 Atrioventricular block, complete: Secondary | ICD-10-CM

## 2011-09-23 LAB — ICD DEVICE OBSERVATION
AL AMPLITUDE: 0.75 mv
BATTERY VOLTAGE: 2.9915 V
LV LEAD IMPEDENCE ICD: 798 Ohm
RV LEAD IMPEDENCE ICD: 627 Ohm
RV LEAD THRESHOLD: 0.5 V
TOT-0006: 20100723000000
TZAT-0001ATACH: 2
TZAT-0001ATACH: 3
TZAT-0001SLOWVT: 2
TZAT-0004SLOWVT: 8
TZAT-0004SLOWVT: 8
TZAT-0012ATACH: 150 ms
TZAT-0012ATACH: 150 ms
TZAT-0012FASTVT: 200 ms
TZAT-0012SLOWVT: 200 ms
TZAT-0012SLOWVT: 200 ms
TZAT-0018ATACH: NEGATIVE
TZAT-0018FASTVT: NEGATIVE
TZAT-0019ATACH: 6 V
TZAT-0020ATACH: 1.5 ms
TZAT-0020ATACH: 1.5 ms
TZAT-0020FASTVT: 1.5 ms
TZAT-0020SLOWVT: 1.5 ms
TZAT-0020SLOWVT: 1.5 ms
TZON-0003ATACH: 350 ms
TZON-0003SLOWVT: 350 ms
TZON-0003VSLOWVT: 430 ms
TZON-0004VSLOWVT: 28
TZST-0001ATACH: 4
TZST-0001ATACH: 6
TZST-0001FASTVT: 2
TZST-0001FASTVT: 4
TZST-0001FASTVT: 6
TZST-0001SLOWVT: 3
TZST-0001SLOWVT: 5
TZST-0002ATACH: NEGATIVE
TZST-0002FASTVT: NEGATIVE
TZST-0003SLOWVT: 20 J
TZST-0003SLOWVT: 35 J
VF: 0

## 2011-09-23 MED ORDER — VALSARTAN 80 MG PO TABS: 80.0000 mg | ORAL_TABLET | Freq: Every day | ORAL | Status: DC

## 2011-09-23 NOTE — Patient Instructions (Signed)
Your physician wants you to follow-up in: 12 moths with Dr Jacquiline Doe will receive a reminder letter in the mail two months in advance. If you don't receive a letter, please call our office to schedule the follow-up appointment.  Remote monitoring is used to monitor your Pacemaker of ICD from home. This monitoring reduces the number of office visits required to check your device to one time per year. It allows [place] to keep an eye on the functioning of your device to ensure it is working properly. You are scheduled for a device check from home on 12/29/11 You may send your transmission at any time that day. If you have a wireless device, the transmission will be sent automatically. After your physician reviews your transmission, you will receive a postcard with your next transmission date.     Hold Coumadin and 3 days  and start Pradaxa 150mg  twice daily when INR is less than 2.0

## 2011-09-29 ENCOUNTER — Encounter: Payer: Self-pay | Admitting: Internal Medicine

## 2011-09-29 NOTE — Assessment & Plan Note (Signed)
Stable. No changes today. 

## 2011-09-29 NOTE — Assessment & Plan Note (Addendum)
Permanent afib He would like to switch to pradaxa We will hold coumadin and start pradaxa 150mg  BID once INR <2.

## 2011-09-29 NOTE — Progress Notes (Signed)
PCP:  Kari Baars, MD, MD  The patient presents today for routine electrophysiology followup.  Since last being seen in our clinic, the patient reports doing very well.  Today, he denies symptoms of palpitations, chest pain, shortness of breath, orthopnea, PND, lower extremity edema, dizziness, presyncope, syncope, or neurologic sequela.  The patient feels that he is tolerating medications without difficulties and is otherwise without complaint today.   Past Medical History  Diagnosis Date  . Atrial fibrillation     permanent  . Hyperlipidemia   . Sleep apnea   . Arthritis   . GERD (gastroesophageal reflux disease)   . Chronic systolic dysfunction of left ventricle     s/p BiV ICD  . CAD (coronary artery disease)   . Osteoarthritis   . Elevated LFTs   . IFG (impaired fasting glucose)   . Fatigue   . Dysphagia   . Complete heart block     s/p AV nodal ablation for afib   Past Surgical History  Procedure Date  . Hernia repair   . Replacement total knee 2000, 2011 x2    bilateral  . Hydrocele excision   . Av node ablation   . Pacemaker insertion 2006  . Cardiac defibrillator placement 2007, 2010    MDT BiV ICD,  6949 RV lead, though pace/ sense is a Medtronic model 4076 - 58  lead  . Hand surgery     left    Current Outpatient Prescriptions  Medication Sig Dispense Refill  . carvedilol (COREG) 25 MG tablet Take 1 and 1/2 tablets twice a day  270 tablet  3  . simvastatin (ZOCOR) 40 MG tablet Take 1 tablet (40 mg total) by mouth every evening.  90 tablet  3  . temazepam (RESTORIL) 15 MG capsule Take 15 mg by mouth at bedtime as needed.        . valsartan (DIOVAN) 80 MG tablet Take 1 tablet (80 mg total) by mouth daily.  30 tablet  11  . warfarin (COUMADIN) 5 MG tablet Take 1 and 1/2 daily or as directed  45 tablet  0   Current Facility-Administered Medications  Medication Dose Route Frequency Provider Last Rate Last Dose  . 0.9 %  sodium chloride infusion  500 mL  Intravenous Continuous Yancey Flemings, MD        Allergies  Allergen Reactions  . Lisinopril   . Niacin     History   Social History  . Marital Status: Married    Spouse Name: N/A    Number of Children: 2  . Years of Education: N/A   Occupational History  . sales    Social History Main Topics  . Smoking status: Former Smoker -- 1.0 packs/day for 8 years    Types: Cigarettes    Quit date: 10/10/1997  . Smokeless tobacco: Never Used  . Alcohol Use: Yes     a 6 pack of beer a week, three glasses of wine a week  . Drug Use: No  . Sexually Active: Not on file   Other Topics Concern  . Not on file   Social History Narrative  . No narrative on file    Family History  Problem Relation Age of Onset  . Colon cancer Neg Hx   . Brain cancer Brother     step-brother  . Emphysema Father     smoker  . Arthritis Father   . Bone cancer Maternal Uncle   . Throat cancer Maternal Aunt   .  Breast cancer Cousin     maternal cousin    ROS-  All systems are reviewed and are negative except as outlined in the HPI above   Physical Exam: Filed Vitals:   09/23/11 1011  BP: 116/74  Pulse: 70  Height: 6\' 5"  (1.956 m)  Weight: 266 lb (120.657 kg)    GEN- The patient is well appearing, alert and oriented x 3 today.   Head- normocephalic, atraumatic Eyes-  Sclera clear, conjunctiva pink Ears- hearing intact Oropharynx- clear Neck- supple, no JVP Lymph- no cervical lymphadenopathy Lungs- Clear to ausculation bilaterally, normal work of breathing Chest- ICD pocket is well healed Heart- Regular rate and rhythm (paced),   GI- soft, NT, ND, + BS Extremities- no clubbing, cyanosis, or edema MS- no significant deformity or atrophy Skin- no rash or lesion Psych- euthymic mood, full affect Neuro- strength and sensation are intact  ICD interrogation- reviewed in detail today,  See PACEART report  Assessment and Plan:

## 2011-09-29 NOTE — Assessment & Plan Note (Signed)
Normal BiV ICD function See [name] report Though pt has a 6949 fidelis RV lead, the pace/sense port is connected to a Medtronic model 4076 - 58 (serial number [id number]) lead. No changes today

## 2011-10-21 ENCOUNTER — Ambulatory Visit (INDEPENDENT_AMBULATORY_CARE_PROVIDER_SITE_OTHER): Payer: PRIVATE HEALTH INSURANCE | Admitting: *Deleted

## 2011-10-21 DIAGNOSIS — R0989 Other specified symptoms and signs involving the circulatory and respiratory systems: Secondary | ICD-10-CM

## 2011-10-21 DIAGNOSIS — I5032 Chronic diastolic (congestive) heart failure: Secondary | ICD-10-CM

## 2011-10-21 DIAGNOSIS — Z7901 Long term (current) use of anticoagulants: Secondary | ICD-10-CM

## 2011-10-21 DIAGNOSIS — I4891 Unspecified atrial fibrillation: Secondary | ICD-10-CM

## 2011-10-28 ENCOUNTER — Telehealth: Payer: Self-pay | Admitting: Allergy

## 2011-10-28 MED ORDER — TEMAZEPAM 15 MG PO CAPS: 15.0000 mg | ORAL_CAPSULE | Freq: Every evening | ORAL | Status: DC | PRN

## 2011-10-28 NOTE — Telephone Encounter (Signed)
Per Cy-okay to refill x 5.

## 2011-10-28 NOTE — Telephone Encounter (Signed)
Pharmacy requesting  Temazepam 15 mg 1po qhs prn  Allergies  Allergen Reactions  . Lisinopril   . Niacin    Dr Maple Hudson is this ok to fill  Thank you

## 2011-10-28 NOTE — Telephone Encounter (Signed)
rx has been called into the pharmacy

## 2011-12-23 ENCOUNTER — Ambulatory Visit: Payer: PRIVATE HEALTH INSURANCE | Admitting: Internal Medicine

## 2011-12-29 ENCOUNTER — Ambulatory Visit (INDEPENDENT_AMBULATORY_CARE_PROVIDER_SITE_OTHER): Payer: PRIVATE HEALTH INSURANCE | Admitting: *Deleted

## 2011-12-29 DIAGNOSIS — I428 Other cardiomyopathies: Secondary | ICD-10-CM

## 2011-12-29 DIAGNOSIS — I509 Heart failure, unspecified: Secondary | ICD-10-CM

## 2011-12-29 DIAGNOSIS — I5032 Chronic diastolic (congestive) heart failure: Secondary | ICD-10-CM

## 2011-12-30 ENCOUNTER — Encounter: Payer: Self-pay | Admitting: Internal Medicine

## 2012-01-02 LAB — REMOTE ICD DEVICE
AL AMPLITUDE: 0.5 mv
AL IMPEDENCE ICD: 551 Ohm
BATTERY VOLTAGE: 2.9847 V
BRDY-0002LV: 60 {beats}/min
LV LEAD IMPEDENCE ICD: 836 Ohm
LV LEAD THRESHOLD: 0.75 V
RV LEAD IMPEDENCE ICD: 646 Ohm
TOT-0002: 0
TOT-0006: 20100723000000
TZAT-0001FASTVT: 1
TZAT-0002ATACH: NEGATIVE
TZAT-0002FASTVT: NEGATIVE
TZAT-0011SLOWVT: 10 ms
TZAT-0011SLOWVT: 10 ms
TZAT-0012ATACH: 150 ms
TZAT-0012FASTVT: 200 ms
TZAT-0012SLOWVT: 200 ms
TZAT-0012SLOWVT: 200 ms
TZAT-0018ATACH: NEGATIVE
TZAT-0018SLOWVT: NEGATIVE
TZAT-0018SLOWVT: NEGATIVE
TZAT-0019ATACH: 6 V
TZAT-0019ATACH: 6 V
TZAT-0019SLOWVT: 8 V
TZAT-0019SLOWVT: 8 V
TZAT-0020ATACH: 1.5 ms
TZAT-0020ATACH: 1.5 ms
TZAT-0020FASTVT: 1.5 ms
TZAT-0020SLOWVT: 1.5 ms
TZAT-0020SLOWVT: 1.5 ms
TZON-0005SLOWVT: 12
TZST-0001ATACH: 5
TZST-0001FASTVT: 3
TZST-0001FASTVT: 4
TZST-0001FASTVT: 5
TZST-0001SLOWVT: 4
TZST-0002ATACH: NEGATIVE
TZST-0002ATACH: NEGATIVE
TZST-0002FASTVT: NEGATIVE
TZST-0002FASTVT: NEGATIVE
TZST-0003SLOWVT: 35 J
VENTRICULAR PACING ICD: 99.85 pct
VF: 0

## 2012-01-03 ENCOUNTER — Telehealth: Payer: Self-pay | Admitting: *Deleted

## 2012-01-03 NOTE — Telephone Encounter (Signed)
Patient return call. An appointment was made for 01/09/12 at 8:30 AM, Patient aware.

## 2012-01-03 NOTE — Telephone Encounter (Signed)
Message copied by Harriet Butte on Tue Jan 03, 2012  9:21 AM ------      Message from: Laurey Morale      Created: Mon Jan 02, 2012  9:05 PM      Regarding: FW: elevated optivol       I need to see Mr Laster in the office to assess volume status, etc (see below).  Please have him come in to see me soon, may overbook if needed.             ----- Message -----         From: Hillis Range, MD         Sent: 01/02/2012   8:40 PM           To: Laurey Morale, MD      Subject: elevated optivol                                         Mr Ghan' optivol has been elevated for several weeks and continues to rise.  He may benefit from a phone call or office visit.            Thanks      [employer]

## 2012-01-03 NOTE — Telephone Encounter (Signed)
Left a message at pt's  home phone, regarding   needing to call back. Patient needs to be seen soon per Dr. Shirlee Latch to asses volume status.

## 2012-01-03 NOTE — Telephone Encounter (Deleted)
Patient called left a message to call back regarding needing to make an appointment to be seen by Dr. Shirlee Latch soon for elevated optive lab work,  per Dr. Shirlee Latch MD.

## 2012-01-05 ENCOUNTER — Encounter: Payer: Self-pay | Admitting: Cardiology

## 2012-01-05 ENCOUNTER — Ambulatory Visit (INDEPENDENT_AMBULATORY_CARE_PROVIDER_SITE_OTHER): Payer: PRIVATE HEALTH INSURANCE | Admitting: Cardiology

## 2012-01-05 VITALS — BP 130/95 | HR 60 | Ht 77.0 in | Wt 271.0 lb

## 2012-01-05 DIAGNOSIS — I4891 Unspecified atrial fibrillation: Secondary | ICD-10-CM

## 2012-01-05 DIAGNOSIS — I428 Other cardiomyopathies: Secondary | ICD-10-CM

## 2012-01-05 MED ORDER — FUROSEMIDE 20 MG PO TABS: 20.0000 mg | ORAL_TABLET | Freq: Every day | ORAL | Status: DC

## 2012-01-05 MED ORDER — POTASSIUM CHLORIDE ER 10 MEQ PO TBCR: 10.0000 meq | EXTENDED_RELEASE_TABLET | Freq: Two times a day (BID) | ORAL | Status: DC

## 2012-01-05 NOTE — Patient Instructions (Signed)
Start lasix(furosemide) 20mg  daily.  Start KCL(potassium) 10 mEq  Daily.  Your physician recommends that you return for lab work in: 2 weeks --BMET/BNP   Your physician has requested that you have an echocardiogram. Echocardiography is a painless test that uses sound waves to create images of your heart. It provides your doctor with information about the size and shape of your heart and how well your heart's chambers and valves are working. This procedure takes approximately one hour. There are no restrictions for this procedure. In 2 weeks when you have lab.  Your physician recommends that you schedule a follow-up appointment in: 2 months with Dr Shirlee Latch.

## 2012-01-06 NOTE — Assessment & Plan Note (Signed)
Continue Pradaxa.  I will get a BMET to follow creatinine.

## 2012-01-06 NOTE — Assessment & Plan Note (Signed)
Patient has stable exertional dyspnea (NYHA class II-III).  Last echo in 8/11 showed EF improved to 50-55%.  He has a CRT-D device. Recent Optivol check showed decreased thoracic impedance suggestive of volume overload.  Neck veins are difficult to assess.  I would like him to try Lasix 20 mg daily with KCl 10 mEq daily to see if this helps with his dyspnea.  He will continue Coreg 37.5 bid and valsartan 80 daily. I am going to repeat an echocardiogram to see if EF remains near normal. BMET/BNP in 2 wks.

## 2012-01-06 NOTE — Progress Notes (Signed)
PCP: Dr. Clelia Croft  66 yo with history of chronic atrial fibrillation and nonischemic cardiomyopathy presents for cardiology followup.  On last echo in 8/11, LV EF had improved to 50-55%.  He has had an AV nodal ablation and has a Medtronic CRT-D system in place.  Main limitation is from knee pain (both knees have been replaced).  He is able to walk on flat ground with no dyspnea.  Some shortness of breath with walking up hills or steps and with heavier yardwork.  No chest pain.  No PND.  He has OSA and uses CPAP.  Recent Optivol checks have shown decreased thoracic impedance.   PMH: 1. OSA on CPAP 2. Chronic atrial fibrillation: AV nodal ablation on 1/05.  3. Nonischemic cardiomyopathy: Normal cath in 2004.  Stress test in 2009 at [facility] normal.  Last echo in 8/11 showed recovery of LV systolic function: EF 50-55%.  Patient has a Medtronic BiV-ICD.   4. Dysphagia 5. Hyperlipidemia 6. HTN 7. GERD 8. Basal cell carcinoma s/p excision 9. Bilateral inguinal hernia repairs 10. Left TKR in 2007, redo right TKR in 11/10.   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    FH: Mother with Alzheimers  ROS: All systems reviewed and negative except as per HPI.   Current Outpatient Prescriptions  Medication Sig Dispense Refill  . carvedilol (COREG) 25 MG tablet Take 1 and 1/2 tablets twice a day  270 tablet  3  . ergocalciferol (VITAMIN D2) 50000 UNITS capsule Take 50,000 Units by mouth once a week.      . simvastatin (ZOCOR) 40 MG tablet Take 1 tablet (40 mg total) by mouth every evening.  90 tablet  3  . temazepam (RESTORIL) 15 MG capsule Take 1 capsule (15 mg total) by mouth at bedtime as needed.  30 capsule  5  . valsartan (DIOVAN) 80 MG tablet Take 1 tablet (80 mg total) by mouth daily.  30 tablet  11  . dabigatran (PRADAXA) 150 MG CAPS Take 1 capsule (150 mg total) by mouth every 12 (twelve) hours.      . furosemide (LASIX) 20 MG tablet Take 1  tablet (20 mg total) by mouth daily.  30 tablet  6  . potassium chloride (K-DUR) 10 MEQ tablet Take 1 tablet (10 mEq total) by mouth 2 (two) times daily.  30 tablet  6  . temazepam (RESTORIL) 15 MG capsule Take 1 capsule (15 mg total) by mouth at bedtime as needed for sleep.  30 capsule  1   Current Facility-Administered Medications  Medication Dose Route Frequency Provider Last Rate Last Dose  . 0.9 %  sodium chloride infusion  500 mL Intravenous Continuous Hilarie Fredrickson, MD        BP 130/95  Pulse 60  Ht 6\' 5"  (1.956 m)  Wt 271 lb (122.925 kg)  BMI 32.14 kg/m2 General: NAD Neck: JVP difficult, no thyromegaly or thyroid nodule.  Lungs: Clear to auscultation bilaterally with normal respiratory effort. CV: Nondisplaced PMI.  Heart regular S1/S2, no S3/S4, no murmur.  No peripheral edema.  No carotid bruit.  Normal pedal pulses.  Abdomen: Soft, nontender, no hepatosplenomegaly, no distention.  Neurologic: Alert and oriented x 3.  Psych: Normal affect. Extremities: No clubbing or cyanosis.

## 2012-01-09 ENCOUNTER — Ambulatory Visit: Payer: PRIVATE HEALTH INSURANCE | Admitting: Internal Medicine

## 2012-01-09 ENCOUNTER — Encounter: Payer: Self-pay | Admitting: *Deleted

## 2012-01-09 ENCOUNTER — Ambulatory Visit: Payer: PRIVATE HEALTH INSURANCE | Admitting: Cardiology

## 2012-01-10 NOTE — Progress Notes (Signed)
ICD remote with ICM 

## 2012-01-17 ENCOUNTER — Other Ambulatory Visit: Payer: Self-pay | Admitting: *Deleted

## 2012-01-17 ENCOUNTER — Other Ambulatory Visit: Payer: Self-pay

## 2012-01-17 ENCOUNTER — Other Ambulatory Visit (INDEPENDENT_AMBULATORY_CARE_PROVIDER_SITE_OTHER): Payer: PRIVATE HEALTH INSURANCE

## 2012-01-17 ENCOUNTER — Ambulatory Visit (HOSPITAL_COMMUNITY): Payer: PRIVATE HEALTH INSURANCE | Attending: Cardiovascular Disease

## 2012-01-17 DIAGNOSIS — R0989 Other specified symptoms and signs involving the circulatory and respiratory systems: Secondary | ICD-10-CM | POA: Insufficient documentation

## 2012-01-17 DIAGNOSIS — I4891 Unspecified atrial fibrillation: Secondary | ICD-10-CM

## 2012-01-17 DIAGNOSIS — I509 Heart failure, unspecified: Secondary | ICD-10-CM | POA: Insufficient documentation

## 2012-01-17 DIAGNOSIS — E785 Hyperlipidemia, unspecified: Secondary | ICD-10-CM | POA: Insufficient documentation

## 2012-01-17 DIAGNOSIS — I428 Other cardiomyopathies: Secondary | ICD-10-CM

## 2012-01-17 DIAGNOSIS — I059 Rheumatic mitral valve disease, unspecified: Secondary | ICD-10-CM | POA: Insufficient documentation

## 2012-01-17 DIAGNOSIS — I5032 Chronic diastolic (congestive) heart failure: Secondary | ICD-10-CM

## 2012-01-17 DIAGNOSIS — I10 Essential (primary) hypertension: Secondary | ICD-10-CM | POA: Insufficient documentation

## 2012-01-17 DIAGNOSIS — R0609 Other forms of dyspnea: Secondary | ICD-10-CM | POA: Insufficient documentation

## 2012-01-17 DIAGNOSIS — Z87891 Personal history of nicotine dependence: Secondary | ICD-10-CM | POA: Insufficient documentation

## 2012-01-17 DIAGNOSIS — E669 Obesity, unspecified: Secondary | ICD-10-CM | POA: Insufficient documentation

## 2012-01-17 LAB — BASIC METABOLIC PANEL
Calcium: 9.1 mg/dL (ref 8.4–10.5)
Creatinine, Ser: 1 mg/dL (ref 0.4–1.5)
GFR: 77.16 mL/min (ref >60.00)
Glucose, Bld: 96 mg/dL (ref 70–99)
Sodium: 141 mEq/L (ref 135–145)

## 2012-01-17 MED ORDER — VALSARTAN 80 MG PO TABS: 80.0000 mg | ORAL_TABLET | Freq: Every day | ORAL | Status: DC

## 2012-01-31 ENCOUNTER — Encounter: Payer: Self-pay | Admitting: Internal Medicine

## 2012-01-31 ENCOUNTER — Ambulatory Visit (INDEPENDENT_AMBULATORY_CARE_PROVIDER_SITE_OTHER): Payer: PRIVATE HEALTH INSURANCE | Admitting: Internal Medicine

## 2012-01-31 VITALS — BP 110/78 | HR 67 | Ht 77.0 in | Wt 274.6 lb

## 2012-01-31 DIAGNOSIS — G4733 Obstructive sleep apnea (adult) (pediatric): Secondary | ICD-10-CM

## 2012-01-31 NOTE — Progress Notes (Signed)
Patient ID: [name], male    DOB: [birth year], 66 y.o.   MRN: [id number]  HPI 05/13/11- 50 year old man, former smoker, referred courtesy of Dr. Eric Form for sleep medicine evaluation because of sleep apnea. An NPSG 01/08/09 documented  Severe sleep apnea, AHI 38.3/ hr. He tried CPAP briefly, but was unable to tolerate it because of claustrophobia. Since then he has been bothered by persistent tiredness and decided to look into the issue again. His friends have told him that he should be on CPAP. His wife has never complained of his snoring or described apnea. He now feels "worn out". 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 He admits to daytime sleepiness while driving. A recent CPAP titration study on 04/12/2011 showed good control on CPAP at 10 CWP with an AHI of 0 per hour. Caffeine is no help. Usual bedtime 11 PM estimating sleep latency 30-45 minutes. He will wake briefly 2 or 3 times during the night with nocturia x1. Finally up between 5:30 and 6:30 AM. Past history of atrial fibrillation with defibrillator/pacemaker. He denies history of myocardial infarction, lung or thyroid disease.  06/24/11- 64 yoM followed for OSA/ hypersomnia complicated by AFib/ pacemaker/ MI He is retrying CPAP 10/ Advanced and finds it a lot better this time around. He is averaging 51/2 hours use/ night. He notes much less daytime sleepiness. Used temazepam only 2-3 times, mainly if wound up at bedtime. He made up his mind he would make this work. Compliance and control are good.   01/31/12- 64 yoM followed for OSA/ hypersomnia complicated by AFib/ pacemaker/ MI Using CPAP 10/[facility]. Uncomfortable with mask. Full facemask is too hot. Chin strap doesn't keep his mouth closed. Had some questions about how to operate the machine. I reviewed how to discuss these issues with the home care company.  ROS-see HPI Constitutional:   No-   weight loss, night sweats,  fevers, chills, fatigue, lassitude. HEENT:   No-  headaches, difficulty swallowing, tooth/dental problems, sore throat,       No-  sneezing, itching, ear ache, nasal congestion, post nasal drip,  CV:  No-   chest pain, orthopnea, PND, swelling in lower extremities, anasarca, dizziness, palpitations Resp: No-   shortness of breath with exertion or at rest.              No-   productive cough,  No non-productive cough,  No- coughing up of blood.              No-   change in color of mucus.  No- wheezing.   Skin: No-   rash or lesions. GI:  No-   heartburn, indigestion, abdominal pain, nausea, vomiting,  GU: MS:  No-   joint pain or swelling.   Neuro-     nothing unusual Psych:  No- change in mood or affect. No depression or anxiety.  No memory loss.   OBJ- Physical Exam General- Alert, Oriented, Affect-appropriate, Distress- none acute, tall, trim Skin- rash-none, lesions- none, excoriation- none Lymphadenopathy- none Head- atraumatic            Eyes- Gross vision intact, PERRLA, conjunctivae and secretions clear            Ears- Hearing, canals-normal            Nose- Clear, no-Septal dev, mucus, polyps, erosion, perforation  Throat- Mallampati III , mucosa clear , drainage- none, tonsils- atrophic Neck- flexible , trachea midline, no stridor , thyroid nl, carotid no bruit Chest - symmetrical excursion , unlabored           Heart/CV- RRR , no murmur , no gallop  , no rub, nl s1 s2                           - JVD- none , edema- none, stasis changes- none, varices- none           Lung- clear to P&A, wheeze- none, cough- none , dullness-none, rub- none           Chest wall-  Abd-  Br/ Gen/ Rectal- Not done, not indicated Extrem- cyanosis- none, clubbing, none, atrophy- none, strength- nl Neuro- grossly intact to observation

## 2012-01-31 NOTE — Patient Instructions (Addendum)
Consider trying otc Biotene at drugstore for dry mouth  Try running your CPAP with water in humidifier but heater turned off  Consider an oral appliance. You might try calling the orthodontic office of Dr Althea Grimmer for information on this alternative way of treating sleep apnea.  Order- Advanced- increase CPAP to 11 cwp

## 2012-02-03 ENCOUNTER — Encounter: Payer: Self-pay | Admitting: Internal Medicine

## 2012-02-03 NOTE — Assessment & Plan Note (Signed)
I offered referral to the sleep center daytime staff to review mask options but he declined. Suggested Biotene for dry mouth Offered referral for oral appliance

## 2012-02-27 ENCOUNTER — Encounter: Payer: Self-pay | Admitting: Cardiology

## 2012-02-27 ENCOUNTER — Ambulatory Visit (INDEPENDENT_AMBULATORY_CARE_PROVIDER_SITE_OTHER): Payer: PRIVATE HEALTH INSURANCE | Admitting: Cardiology

## 2012-02-27 VITALS — BP 104/70 | HR 64 | Ht 77.0 in | Wt 269.1 lb

## 2012-02-27 DIAGNOSIS — I10 Essential (primary) hypertension: Secondary | ICD-10-CM

## 2012-02-27 DIAGNOSIS — I428 Other cardiomyopathies: Secondary | ICD-10-CM

## 2012-02-27 DIAGNOSIS — I4891 Unspecified atrial fibrillation: Secondary | ICD-10-CM

## 2012-02-27 LAB — BASIC METABOLIC PANEL
Chloride: 104 mEq/L (ref 96–112)
GFR: 73.81 mL/min (ref >60.00)
Potassium: 3.9 mEq/L (ref 3.5–5.1)
Sodium: 140 mEq/L (ref 135–145)

## 2012-02-27 NOTE — Assessment & Plan Note (Signed)
Patient has stable exertional dyspnea (NYHA class II).  Last echo in 4/13 showed that EF remained low normal to mildly decreased, 50-55%.  He has a CRT-D device. Optivol showed improved thoracic impedance after starting low dose Lasix.  He was not able to tolerate the KCl pill so is not taking it.  - BMET today to follow K/creatinine.  - Continue current doses of Coreg and valsartan - Continue current Lasix dose.

## 2012-02-27 NOTE — Patient Instructions (Signed)
BMET today.  Your physician wants you to follow-up in: 6 months with Dr. Shirlee Latch.  You will receive a reminder letter in the mail two months in advance. If you don't receive a letter, please call our office to schedule the follow-up appointment.

## 2012-02-27 NOTE — Assessment & Plan Note (Signed)
Continue Pradaxa.  Creatinine has been normal.

## 2012-02-27 NOTE — Progress Notes (Signed)
PCP: Dr. Clelia Croft  66 yo with history of chronic atrial fibrillation and nonischemic cardiomyopathy presents for cardiology followup.  On last echo in 4/13, LV EF remained improved at 50-55%.  He has had an AV nodal ablation and has a Medtronic CRT-D system in place.  Main limitation is from knee pain (both knees have been replaced).  He is able to walk on flat ground with no dyspnea.  Some shortness of breath with walking up hills or steps and with heavier yardwork.  He can ride his stationary bike for 30 minutes without problems.  No chest pain.  No PND.  He has OSA and uses CPAP.  At last appointment, he reported increased dyspnea and Optivol suggested volume overload.  I started him on Lasix 20 mg daily.  Repeat Optivol today showed that thoracic impedance had gone back up, suggestive of improved volume status.   Labs (4/13): BNP 89, K 4.3, creatinine 1.0  ECG: Atrial fibrillation with BiV-pacing  PMH: 1. OSA on CPAP 2. Chronic atrial fibrillation: AV nodal ablation on 1/05.  3. Nonischemic cardiomyopathy: Normal cath in 2004.  Stress test in 2009 at [facility] normal.  Echo in 8/11 showed recovery of LV systolic function: EF 50-55%.  Echo in 4/13 with EF 50-55%, decreased RV systolic function, mild MR, PASP 32 mmHg.   Patient has a Medtronic BiV-ICD.   4. Dysphagia 5. Hyperlipidemia 6. HTN 7. GERD 8. Basal cell carcinoma s/p excision 9. Bilateral inguinal hernia repairs 10. Left TKR in 2007, redo right TKR in 11/10.   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    FH: Mother with Alzheimers  Current Outpatient Prescriptions  Medication Sig Dispense Refill  . carvedilol (COREG) 25 MG tablet Take 1 and 1/2 tablets twice a day  270 tablet  3  . dabigatran (PRADAXA) 150 MG CAPS Take 1 capsule (150 mg total) by mouth every 12 (twelve) hours.      . furosemide (LASIX) 20 MG tablet Take 20 mg by mouth daily.      . simvastatin (ZOCOR) 40 MG  tablet Take 1 tablet (40 mg total) by mouth every evening.  90 tablet  3  . temazepam (RESTORIL) 15 MG capsule Take 1 capsule (15 mg total) by mouth at bedtime as needed.  30 capsule  5  . valsartan (DIOVAN) 80 MG tablet Take 1 tablet (80 mg total) by mouth daily.  30 tablet  11  . temazepam (RESTORIL) 15 MG capsule Take 1 capsule (15 mg total) by mouth at bedtime as needed for sleep.  30 capsule  1    BP 104/70  Pulse 64  Ht 6\' 5"  (1.956 m)  Wt 269 lb 1.9 oz (122.072 kg)  BMI 31.91 kg/m2 General: NAD Neck: No JVD, no thyromegaly or thyroid nodule.  Lungs: Clear to auscultation bilaterally with normal respiratory effort. CV: Nondisplaced PMI.  Heart regular S1/S2, no S3/S4, no murmur.  No peripheral edema.  No carotid bruit.  Normal pedal pulses.  Abdomen: Soft, nontender, no hepatosplenomegaly, no distention.  Neurologic: Alert and oriented x 3.  Psych: Normal affect. Extremities: No clubbing or cyanosis.

## 2012-02-28 ENCOUNTER — Telehealth: Payer: Self-pay | Admitting: Cardiology

## 2012-02-28 NOTE — Telephone Encounter (Signed)
PT AWARE OF LAB RESULTS./CY 

## 2012-02-28 NOTE — Telephone Encounter (Signed)
Fu call °Pt returning your call  °

## 2012-03-09 ENCOUNTER — Ambulatory Visit: Payer: PRIVATE HEALTH INSURANCE | Admitting: Cardiology

## 2012-03-09 ENCOUNTER — Encounter: Payer: Self-pay | Admitting: Cardiology

## 2012-04-05 ENCOUNTER — Encounter: Payer: PRIVATE HEALTH INSURANCE | Admitting: *Deleted

## 2012-04-09 ENCOUNTER — Encounter: Payer: Self-pay | Admitting: *Deleted

## 2012-04-14 ENCOUNTER — Encounter: Payer: Self-pay | Admitting: Internal Medicine

## 2012-04-16 ENCOUNTER — Ambulatory Visit (INDEPENDENT_AMBULATORY_CARE_PROVIDER_SITE_OTHER): Payer: PRIVATE HEALTH INSURANCE | Admitting: *Deleted

## 2012-04-16 DIAGNOSIS — I442 Atrioventricular block, complete: Secondary | ICD-10-CM

## 2012-04-16 DIAGNOSIS — I428 Other cardiomyopathies: Secondary | ICD-10-CM

## 2012-04-16 DIAGNOSIS — I5032 Chronic diastolic (congestive) heart failure: Secondary | ICD-10-CM

## 2012-04-16 LAB — REMOTE ICD DEVICE
AL AMPLITUDE: 0.875 mv
BATTERY VOLTAGE: 2.9233 V
BRDY-0004LV: 120 {beats}/min
CHARGE TIME: 11.03 s
FVT: 0
LV LEAD THRESHOLD: 0.75 V
RV LEAD IMPEDENCE ICD: 684 Ohm
TOT-0001: 1
TZAT-0001ATACH: 1
TZAT-0001ATACH: 2
TZAT-0001ATACH: 3
TZAT-0001FASTVT: 1
TZAT-0001SLOWVT: 1
TZAT-0001SLOWVT: 2
TZAT-0002ATACH: NEGATIVE
TZAT-0002FASTVT: NEGATIVE
TZAT-0012ATACH: 150 ms
TZAT-0013SLOWVT: 2
TZAT-0013SLOWVT: 2
TZAT-0018ATACH: NEGATIVE
TZAT-0018ATACH: NEGATIVE
TZAT-0018ATACH: NEGATIVE
TZAT-0018FASTVT: NEGATIVE
TZAT-0018SLOWVT: NEGATIVE
TZAT-0018SLOWVT: NEGATIVE
TZAT-0019ATACH: 6 V
TZAT-0019SLOWVT: 8 V
TZAT-0020ATACH: 1.5 ms
TZAT-0020SLOWVT: 1.5 ms
TZAT-0020SLOWVT: 1.5 ms
TZON-0003FASTVT: 240 ms
TZON-0004SLOWVT: 24
TZON-0004VSLOWVT: 28
TZON-0005SLOWVT: 12
TZST-0001ATACH: 5
TZST-0001ATACH: 6
TZST-0001FASTVT: 3
TZST-0001FASTVT: 4
TZST-0001FASTVT: 5
TZST-0001FASTVT: 6
TZST-0001SLOWVT: 3
TZST-0001SLOWVT: 5
TZST-0001SLOWVT: 6
TZST-0002ATACH: NEGATIVE
TZST-0002ATACH: NEGATIVE
TZST-0002ATACH: NEGATIVE
TZST-0002FASTVT: NEGATIVE
TZST-0002FASTVT: NEGATIVE
TZST-0003SLOWVT: 35 J
VF: 0

## 2012-04-23 ENCOUNTER — Encounter: Payer: Self-pay | Admitting: *Deleted

## 2012-06-01 ENCOUNTER — Telehealth: Payer: Self-pay | Admitting: Internal Medicine

## 2012-06-01 MED ORDER — TEMAZEPAM 15 MG PO CAPS: 15.0000 mg | ORAL_CAPSULE | Freq: Every evening | ORAL | Status: DC | PRN

## 2012-06-01 NOTE — Telephone Encounter (Signed)
Refill of the temazepam has been called into the pts pharmacy per CY ok.  Nothing further is needed.

## 2012-06-01 NOTE — Telephone Encounter (Signed)
Per CY-okay to refill as requested. 

## 2012-06-01 NOTE — Telephone Encounter (Signed)
[facility] requesting  Temazepam 15 mg capsule <> take 1 capsule at bedtime as needed .  #30 x5 Allergies  Allergen Reactions  . Lisinopril   . Niacin    Dr Maple Hudson please advise  Thank you

## 2012-07-16 ENCOUNTER — Encounter: Payer: PRIVATE HEALTH INSURANCE | Admitting: *Deleted

## 2012-07-17 ENCOUNTER — Telehealth: Payer: Self-pay | Admitting: Cardiology

## 2012-07-17 NOTE — Telephone Encounter (Signed)
He can go on coumadin.  Please talk to Weston Brass or someone in the coumadin clinic to arrange.

## 2012-07-17 NOTE — Telephone Encounter (Signed)
New Problem:    Patient called in wanting to switch his medication from dabigatran (PRADAXA) 150 MG CAPS to coumadin.  Please call back.

## 2012-07-17 NOTE — Telephone Encounter (Signed)
Spoke to patient he stated he is on Medicare now and cannot afford Pradaxa,wants to start coumadin.States he will run out of Pradaxa today and wants to start coumadin tomorrow 07/18/12.States he will be leaving for [facility] in the morning and want be able to have a INR until Monday 07/23/12.Message sent to Dr.McLean for advice.

## 2012-07-17 NOTE — Telephone Encounter (Signed)
Patient called was told received  message from Dr.McLean he advised may go on coumadin,wants Weston Brass our pharmacist to arrange.Will forward message to [place].

## 2012-07-17 NOTE — Telephone Encounter (Signed)
Spoke with pt.  [facility] and [facility] are going [facility] get him [facility] and monitor his INR at the [facility] location for him.

## 2012-07-25 ENCOUNTER — Encounter: Payer: Self-pay | Admitting: *Deleted

## 2012-08-08 ENCOUNTER — Encounter: Payer: Self-pay | Admitting: Internal Medicine

## 2012-08-08 ENCOUNTER — Ambulatory Visit (INDEPENDENT_AMBULATORY_CARE_PROVIDER_SITE_OTHER): Payer: PRIVATE HEALTH INSURANCE | Admitting: *Deleted

## 2012-08-08 DIAGNOSIS — I442 Atrioventricular block, complete: Secondary | ICD-10-CM

## 2012-08-08 DIAGNOSIS — I5032 Chronic diastolic (congestive) heart failure: Secondary | ICD-10-CM

## 2012-08-08 DIAGNOSIS — I428 Other cardiomyopathies: Secondary | ICD-10-CM

## 2012-08-15 ENCOUNTER — Other Ambulatory Visit: Payer: Self-pay | Admitting: Cardiology

## 2012-08-21 LAB — REMOTE ICD DEVICE
AL AMPLITUDE: 0.8 mv
AL IMPEDENCE ICD: 532 Ohm
ATRIAL PACING ICD: 0 pct
CHARGE TIME: 12.502 s
LV LEAD IMPEDENCE ICD: 779 Ohm
RV LEAD IMPEDENCE ICD: 646 Ohm
TOT-0001: 1
TOT-0006: 20100723000000
TZAT-0001ATACH: 1
TZAT-0001ATACH: 2
TZAT-0001FASTVT: 1
TZAT-0004SLOWVT: 8
TZAT-0004SLOWVT: 8
TZAT-0005SLOWVT: 88 pct
TZAT-0005SLOWVT: 91 pct
TZAT-0011SLOWVT: 10 ms
TZAT-0011SLOWVT: 10 ms
TZAT-0012ATACH: 150 ms
TZAT-0012ATACH: 150 ms
TZAT-0012ATACH: 150 ms
TZAT-0012SLOWVT: 200 ms
TZAT-0012SLOWVT: 200 ms
TZAT-0013SLOWVT: 2
TZAT-0013SLOWVT: 2
TZAT-0018ATACH: NEGATIVE
TZAT-0018ATACH: NEGATIVE
TZAT-0018FASTVT: NEGATIVE
TZAT-0020ATACH: 1.5 ms
TZAT-0020ATACH: 1.5 ms
TZAT-0020ATACH: 1.5 ms
TZAT-0020FASTVT: 1.5 ms
TZAT-0020SLOWVT: 1.5 ms
TZON-0003ATACH: 350 ms
TZON-0003FASTVT: 240 ms
TZON-0003SLOWVT: 350 ms
TZON-0003VSLOWVT: 430 ms
TZON-0004SLOWVT: 24
TZST-0001ATACH: 4
TZST-0001FASTVT: 2
TZST-0001FASTVT: 4
TZST-0001FASTVT: 6
TZST-0001SLOWVT: 4
TZST-0001SLOWVT: 5
TZST-0001SLOWVT: 6
TZST-0002ATACH: NEGATIVE
TZST-0002FASTVT: NEGATIVE
TZST-0002FASTVT: NEGATIVE
TZST-0002FASTVT: NEGATIVE
TZST-0003SLOWVT: 20 J
TZST-0003SLOWVT: 35 J
TZST-0003SLOWVT: 35 J
VENTRICULAR PACING ICD: 99.95 pct

## 2012-09-04 ENCOUNTER — Encounter: Payer: Self-pay | Admitting: *Deleted

## 2012-09-12 ENCOUNTER — Encounter: Payer: Self-pay | Admitting: Cardiology

## 2012-09-12 ENCOUNTER — Ambulatory Visit (INDEPENDENT_AMBULATORY_CARE_PROVIDER_SITE_OTHER): Payer: Medicare Other | Admitting: Cardiology

## 2012-09-12 VITALS — BP 118/77 | HR 75 | Wt 265.0 lb

## 2012-09-12 DIAGNOSIS — I4891 Unspecified atrial fibrillation: Secondary | ICD-10-CM

## 2012-09-12 DIAGNOSIS — I10 Essential (primary) hypertension: Secondary | ICD-10-CM

## 2012-09-12 DIAGNOSIS — I5032 Chronic diastolic (congestive) heart failure: Secondary | ICD-10-CM

## 2012-09-12 LAB — BASIC METABOLIC PANEL
BUN: 22 mg/dL (ref 6–23)
CO2: 29 mEq/L (ref 19–32)
Chloride: 102 mEq/L (ref 96–112)
Creatinine, Ser: 1.3 mg/dL (ref 0.4–1.5)

## 2012-09-12 NOTE — Patient Instructions (Addendum)
**Note De-identified [name] Obfuscation** Your physician recommends that you return for lab work in: today  Your physician wants you to follow-up in: 6 months. You will receive a reminder letter in the mail two months in advance. If you don't receive a letter, please call our office to schedule the follow-up appointment.   

## 2012-09-12 NOTE — Progress Notes (Signed)
Patient ID: [name], male   DOB: 04-19-1946, 66 y.o.   MRN: [id number] PCP: Dr. Clelia Croft  66 yo with history of chronic atrial fibrillation and nonischemic cardiomyopathy presents for cardiology followup.  On last echo in 4/13, LV EF remained improved at 50-55%.  He has had an AV nodal ablation and has a Medtronic CRT-D system in place.  Main limitation is from knee pain (both knees have been replaced).  He is able to walk on flat ground with no dyspnea.  Some shortness of breath with walking up hills or steps and with heavier yardwork.  He can ride his stationary bike for 30 minutes without problems.  No chest pain.  No PND.  He has OSA and uses CPAP.  He has been somewhat more fatigued than in the past and attributes this to not being able to keep his CPAP mask on all night.  Weight is down 4 lbs since last appointment.   Labs (4/13): BNP 89, K 4.3, creatinine 1.0 Labs (5/13): K 3.9, creatinine 1.1  ECG: Atrial fibrillation with BiV-pacing  PMH: 1. OSA on CPAP 2. Chronic atrial fibrillation: AV nodal ablation on 1/05.  3. Nonischemic cardiomyopathy: Normal cath in 2004.  Stress test in 2009 at [facility] normal.  Echo in 8/11 showed recovery of LV systolic function: EF 50-55%.  Echo in 4/13 with EF 50-55%, decreased RV systolic function, mild MR, PASP 32 mmHg.   Patient has a Medtronic BiV-ICD.   4. Dysphagia 5. Hyperlipidemia 6. HTN 7. GERD 8. Basal cell carcinoma s/p excision 9. Bilateral inguinal hernia repairs 10. Left TKR in 2007, redo right TKR in 11/10.   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    FH: Mother with Alzheimers  Current Outpatient Prescriptions  Medication Sig Dispense Refill  . carvedilol (COREG) 25 MG tablet Take 1 and 1/2 tablets twice a day  270 tablet  3  . furosemide (LASIX) 20 MG tablet TAKE 1 TABLET BY MOUTH EVERY DAY  30 tablet  5  . simvastatin (ZOCOR) 40 MG tablet Take 1 tablet (40 mg total) by mouth  every evening.  90 tablet  3  . temazepam (RESTORIL) 15 MG capsule Take 1 capsule (15 mg total) by mouth at bedtime as needed for sleep.  30 capsule  1  . valsartan (DIOVAN) 80 MG tablet Take 1 tablet (80 mg total) by mouth daily.  30 tablet  11  . warfarin (COUMADIN) 5 MG tablet Take 5 mg by mouth as needed.      . dabigatran (PRADAXA) 150 MG CAPS Take 1 capsule (150 mg total) by mouth every 12 (twelve) hours.      . temazepam (RESTORIL) 15 MG capsule Take 1 capsule (15 mg total) by mouth at bedtime as needed.  30 capsule  5    BP 118/77  Pulse 75  Wt 265 lb (120.203 kg) General: NAD Neck: No JVD, no thyromegaly or thyroid nodule.  Lungs: Clear to auscultation bilaterally with normal respiratory effort. CV: Nondisplaced PMI.  Heart regular S1/S2, no S3/S4, no murmur.  No peripheral edema.  No carotid bruit.  Normal pedal pulses.  Abdomen: Soft, nontender, no hepatosplenomegaly, no distention.  Neurologic: Alert and oriented x 3.  Psych: Normal affect. Extremities: No clubbing or cyanosis.   Assessment/Plan:  ATRIAL FIBRILLATION  Continue Pradaxa. BMET and CBC to be checked today.  Nonischemic cardiomyopathy Patient has stable exertional dyspnea (NYHA class II). Last echo in 4/13  showed that EF remained low normal to mildly decreased, 50-55%. He has a CRT-D device.  - Continue current doses of Coreg and valsartan  - Continue current Lasix dose.   Followup in 6 months.  Marca Ancona 09/13/2012

## 2012-09-19 ENCOUNTER — Telehealth: Payer: Self-pay | Admitting: *Deleted

## 2012-09-19 NOTE — Telephone Encounter (Signed)
Dr Shirlee Latch received request from [facility] [phone] to change pt  from Diovan to a formulary alternative losartan or irbesartan. Dr Shirlee Latch had stated losartan 50mg  daily would be option instead of Diovan. I spoke with pt and he is able to get Diovan from the [place] and does not want to change. If pt does change to losartan from Diovan in the future Dr Shirlee Latch recommended BMET 2 weeks after changing medication. Correspondence from [facility] has been forward to HIM.

## 2012-10-04 ENCOUNTER — Encounter: Payer: Self-pay | Admitting: *Deleted

## 2012-11-05 ENCOUNTER — Other Ambulatory Visit: Payer: Self-pay | Admitting: Cardiology

## 2012-11-30 ENCOUNTER — Encounter: Payer: Self-pay | Admitting: Internal Medicine

## 2012-11-30 ENCOUNTER — Ambulatory Visit (INDEPENDENT_AMBULATORY_CARE_PROVIDER_SITE_OTHER): Payer: Medicare Other | Admitting: Internal Medicine

## 2012-11-30 VITALS — BP 116/83 | HR 87 | Ht 77.0 in | Wt 269.8 lb

## 2012-11-30 DIAGNOSIS — I428 Other cardiomyopathies: Secondary | ICD-10-CM

## 2012-11-30 DIAGNOSIS — I442 Atrioventricular block, complete: Secondary | ICD-10-CM

## 2012-11-30 DIAGNOSIS — I5032 Chronic diastolic (congestive) heart failure: Secondary | ICD-10-CM

## 2012-11-30 LAB — ICD DEVICE OBSERVATION
AL AMPLITUDE: 0.8 mv
LV LEAD THRESHOLD: 0.75 V
RV LEAD IMPEDENCE ICD: 703 Ohm
TZAT-0001ATACH: 1
TZAT-0001ATACH: 2
TZAT-0001FASTVT: 1
TZAT-0002ATACH: NEGATIVE
TZAT-0004SLOWVT: 8
TZAT-0004SLOWVT: 8
TZAT-0005SLOWVT: 88 pct
TZAT-0005SLOWVT: 91 pct
TZAT-0012ATACH: 150 ms
TZAT-0012ATACH: 150 ms
TZAT-0012ATACH: 150 ms
TZAT-0012SLOWVT: 200 ms
TZAT-0012SLOWVT: 200 ms
TZAT-0013SLOWVT: 2
TZAT-0013SLOWVT: 2
TZAT-0018ATACH: NEGATIVE
TZAT-0018ATACH: NEGATIVE
TZAT-0018FASTVT: NEGATIVE
TZAT-0019ATACH: 6 V
TZAT-0019FASTVT: 8 V
TZAT-0020ATACH: 1.5 ms
TZAT-0020ATACH: 1.5 ms
TZAT-0020FASTVT: 1.5 ms
TZON-0003ATACH: 350 ms
TZON-0003FASTVT: 240 ms
TZON-0003SLOWVT: 350 ms
TZON-0003VSLOWVT: 430 ms
TZON-0004SLOWVT: 24
TZON-0004VSLOWVT: 28
TZST-0001ATACH: 4
TZST-0001ATACH: 6
TZST-0001FASTVT: 2
TZST-0001FASTVT: 4
TZST-0001FASTVT: 6
TZST-0001SLOWVT: 3
TZST-0001SLOWVT: 5
TZST-0001SLOWVT: 6
TZST-0002ATACH: NEGATIVE
TZST-0002FASTVT: NEGATIVE
TZST-0002FASTVT: NEGATIVE
TZST-0003SLOWVT: 20 J
TZST-0003SLOWVT: 35 J

## 2012-11-30 NOTE — Assessment & Plan Note (Signed)
Stable No change required today  NID changed to 30/40 to prevent inappropriate shocks  Battery longevity about 6 months.  Auditory alert demonstrated and the patient was instructed today. He will return to see me in 6 months

## 2012-11-30 NOTE — Progress Notes (Signed)
PCP:  Kari Baars, MD Primary Cardiologist:  Dr Shirlee Latch  The patient presents today for routine electrophysiology followup.  Since last being seen in our clinic, the patient reports doing very well.  Today, he denies symptoms of palpitations, chest pain, shortness of breath, orthopnea, PND, lower extremity edema, dizziness, presyncope, syncope, or neurologic sequela.  The patient feels that he is tolerating medications without difficulties and is otherwise without complaint today.   Past Medical History  Diagnosis Date  . Atrial fibrillation     permanent  . Hyperlipidemia   . Sleep apnea   . Arthritis   . GERD (gastroesophageal reflux disease)   . Chronic systolic dysfunction of left ventricle     s/p BiV ICD  . CAD (coronary artery disease)   . Osteoarthritis   . Elevated LFTs   . IFG (impaired fasting glucose)   . Fatigue   . Dysphagia   . Complete heart block     s/p AV nodal ablation for afib   Past Surgical History  Procedure Laterality Date  . Hernia repair    . Replacement total knee  2000, 2011 x2    bilateral  . Hydrocele excision    . Av node ablation    . Pacemaker insertion  2006  . Cardiac defibrillator placement  2007, 2010    MDT BiV ICD,  6949 RV lead, though pace/ sense is a Medtronic model 4076 - 58  lead  . Hand surgery      left    Current Outpatient Prescriptions  Medication Sig Dispense Refill  . carvedilol (COREG) 25 MG tablet Take 1 and 1/2 tablets twice a day  270 tablet  3  . furosemide (LASIX) 20 MG tablet TAKE 1 TABLET BY MOUTH EVERY DAY  30 tablet  5  . simvastatin (ZOCOR) 40 MG tablet TAKE 1 TABLET BY MOUTH EVERY EVENING  90 tablet  1  . temazepam (RESTORIL) 15 MG capsule Take 1 capsule (15 mg total) by mouth at bedtime as needed.  30 capsule  5  . valsartan (DIOVAN) 80 MG tablet Take 1 tablet (80 mg total) by mouth daily.  30 tablet  11  . warfarin (COUMADIN) 5 MG tablet Take 5 mg by mouth as needed.       No current  facility-administered medications for this visit.    Allergies  Allergen Reactions  . Lisinopril   . Niacin     History   Social History  . Marital Status: Married    Spouse Name: N/A    Number of Children: 2  . Years of Education: N/A   Occupational History  . sales    Social History Main Topics  . Smoking status: Former Smoker -- 1.00 packs/day for 8 years    Types: Cigarettes    Quit date: 10/10/1997  . Smokeless tobacco: Never Used  . Alcohol Use: Yes     Comment: a 6 pack of beer a week, three glasses of wine a week  . Drug Use: No  . Sexually Active: Not on file   Other Topics Concern  . Not on file   Social History Narrative  . No narrative on file    Family History  Problem Relation Age of Onset  . Colon cancer Neg Hx   . Brain cancer Brother     step-brother  . Emphysema Father     smoker  . Arthritis Father   . Bone cancer Maternal Uncle   . Throat cancer Maternal  Aunt   . Breast cancer Cousin     maternal cousin    ROS-  All systems are reviewed and are negative except as outlined in the HPI above   Physical Exam: Filed Vitals:   11/30/12 1100  BP: 116/83  Pulse: 87  Height: 6\' 5"  (1.956 m)  Weight: 269 lb 12.8 oz (122.38 kg)    GEN- The patient is well appearing, alert and oriented x 3 today.   Head- normocephalic, atraumatic Eyes-  Sclera clear, conjunctiva pink Ears- hearing intact Oropharynx- clear Neck- supple, no JVP Lymph- no cervical lymphadenopathy Lungs- Clear to ausculation bilaterally, normal work of breathing Chest- ICD pocket is well healed Heart- Regular rate and rhythm (paced),   GI- soft, NT, ND, + BS Extremities- no clubbing, cyanosis, or edema MS- no significant deformity or atrophy Skin- no rash or lesion Psych- euthymic mood, full affect Neuro- strength and sensation are intact  ICD interrogation- reviewed in detail today,  See PACEART report  Assessment and Plan:

## 2012-11-30 NOTE — Patient Instructions (Addendum)
Your physician wants you to follow-up in: 6 months with Dr. Allred. You will receive a reminder letter in the mail two months in advance. If you don't receive a letter, please call our office to schedule the follow-up appointment.  

## 2012-11-30 NOTE — Assessment & Plan Note (Signed)
Normal BiV ICD function See Pace Art report LV output decreased to 1.75V @0 . 

## 2013-01-31 ENCOUNTER — Encounter: Payer: Self-pay | Admitting: Cardiology

## 2013-02-25 ENCOUNTER — Encounter: Payer: Medicare Other | Admitting: *Deleted

## 2013-03-01 ENCOUNTER — Encounter: Payer: Self-pay | Admitting: *Deleted

## 2013-03-13 ENCOUNTER — Telehealth: Payer: Self-pay | Admitting: Internal Medicine

## 2013-03-13 ENCOUNTER — Encounter: Payer: Self-pay | Admitting: Cardiology

## 2013-03-13 ENCOUNTER — Ambulatory Visit (INDEPENDENT_AMBULATORY_CARE_PROVIDER_SITE_OTHER): Payer: Medicare Other | Admitting: Cardiology

## 2013-03-13 VITALS — BP 128/82 | HR 67 | Ht 77.0 in | Wt 264.0 lb

## 2013-03-13 DIAGNOSIS — I4891 Unspecified atrial fibrillation: Secondary | ICD-10-CM

## 2013-03-13 DIAGNOSIS — G4733 Obstructive sleep apnea (adult) (pediatric): Secondary | ICD-10-CM

## 2013-03-13 DIAGNOSIS — E7800 Pure hypercholesterolemia, unspecified: Secondary | ICD-10-CM

## 2013-03-13 DIAGNOSIS — R0602 Shortness of breath: Secondary | ICD-10-CM

## 2013-03-13 DIAGNOSIS — I5032 Chronic diastolic (congestive) heart failure: Secondary | ICD-10-CM

## 2013-03-13 LAB — CBC WITH DIFFERENTIAL/PLATELET
Basophils Relative: 0.4 % (ref 0.0–3.0)
Eosinophils Absolute: 0.1 10*3/uL (ref 0.0–0.7)
Hemoglobin: 13.6 g/dL (ref 13.0–17.0)
Lymphs Abs: 0.7 10*3/uL (ref 0.7–4.0)
MCHC: 33.8 g/dL (ref 30.0–36.0)
MCV: 98.7 fl (ref 78.0–100.0)
Monocytes Absolute: 0.4 10*3/uL (ref 0.1–1.0)
Neutro Abs: 3.7 10*3/uL (ref 1.4–7.7)
RBC: 4.07 Mil/uL — ABNORMAL LOW (ref 4.22–5.81)

## 2013-03-13 LAB — BASIC METABOLIC PANEL
CO2: 30 mEq/L (ref 19–32)
Chloride: 105 mEq/L (ref 96–112)
Sodium: 141 mEq/L (ref 135–145)

## 2013-03-13 MED ORDER — RIVAROXABAN 20 MG PO TABS: 20.0000 mg | ORAL_TABLET | Freq: Every day | ORAL | Status: DC

## 2013-03-13 MED ORDER — FUROSEMIDE 40 MG PO TABS: 40.0000 mg | ORAL_TABLET | Freq: Every day | ORAL | Status: DC

## 2013-03-13 MED ORDER — APIXABAN 5 MG PO TABS: 5.0000 mg | ORAL_TABLET | Freq: Two times a day (BID) | ORAL | Status: DC

## 2013-03-13 NOTE — Progress Notes (Signed)
Patient ID: [name], male   DOB: [birth year], 67 y.o.   MRN: [id number] PCP: Dr. Clelia Croft  67 yo with history of chronic atrial fibrillation and nonischemic cardiomyopathy presents for cardiology followup.  On last echo in 4/13, LV EF remained improved at 50-55%.  He has had an AV nodal ablation and has a Medtronic CRT-D system in place.  He has OSA and uses CPAP.  He has been more fatigued than in the past and attributes this to not being able to keep his CPAP mask on all night.  He seems to be more short of breath recently.  He is dyspneic after walking 100 yards or walking up a flight of steps.  He has been sleeping on 2 pillows at night for a long time.  He and his wife are moving to [place] and during the move, he has been drinking a lot of Gatorade and eating canned foods.    I checked his Optivol today.  Fluid index has risen and is now above threshold.   Finally, of note, patient has been on warfarin because Pradaxa got too expensive.   Labs (4/13): BNP 89, K 4.3, creatinine 1.0 Labs (5/13): K 3.9, creatinine 1.1 Labs (12/13): K 4, creatinine 1.3  ECG: Atrial fibrillation with BiV-pacing  PMH: 1. OSA on CPAP 2. Chronic atrial fibrillation: AV nodal ablation on 1/05.  3. Nonischemic cardiomyopathy: Normal cath in 2004.  Stress test in 2009 at [facility] normal.  Echo in 8/11 showed recovery of LV systolic function: EF 50-55%.  Echo in 4/13 with EF 50-55%, decreased RV systolic function, mild MR, PASP 32 mmHg.   Patient has a Medtronic BiV-ICD.   4. Dysphagia 5. Hyperlipidemia 6. HTN 7. GERD 8. Basal cell carcinoma s/p excision 9. Bilateral inguinal hernia repairs 10. Left TKR in 2007, redo right TKR in 11/10.   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    FH: Mother with Alzheimers  ROS: All systems reviewed and negative except as per HPI.   Current Outpatient Prescriptions  Medication Sig Dispense Refill  . carvedilol  (COREG) 25 MG tablet Take 1 and 1/2 tablets twice a day  270 tablet  3  . COLCRYS 0.6 MG tablet Take 1 tablet by mouth every 8 (eight) hours.      . simvastatin (ZOCOR) 40 MG tablet TAKE 1 TABLET BY MOUTH EVERY EVENING  90 tablet  1  . temazepam (RESTORIL) 15 MG capsule Take 1 capsule (15 mg total) by mouth at bedtime as needed.  30 capsule  5  . valsartan (DIOVAN) 80 MG tablet Take 1 tablet (80 mg total) by mouth daily.  30 tablet  11  . apixaban (ELIQUIS) 5 MG TABS tablet Take by mouth 2 (two) times daily.      . furosemide (LASIX) 40 MG tablet Take 1 tablet (40 mg total) by mouth daily.  90 tablet  1   No current facility-administered medications for this visit.    BP 128/82  Pulse 67  Ht 6\' 5"  (1.956 m)  Wt 264 lb (119.75 kg)  BMI 31.3 kg/m2 General: NAD Neck: No JVD, no thyromegaly or thyroid nodule.  Lungs: Clear to auscultation bilaterally with normal respiratory effort. CV: Nondisplaced PMI.  Heart regular S1/S2, no S3/S4, no murmur.  No peripheral edema.  No carotid bruit.  Normal pedal pulses.  Abdomen: Soft, nontender, no hepatosplenomegaly, no distention.  Neurologic: Alert and oriented x 3.  Psych: Normal affect.  Extremities: No clubbing or cyanosis.   Assessment/Plan:  ATRIAL FIBRILLATION  Patient wants to try to switch to a NOAC.  Pradaxa was too expensive.  I will give him prescriptions for Eliquis and Xarelto.  He can take whichever is less expensive.  First dose when INR < 2.5.  Nonischemic cardiomyopathy Dyspnea is somewhat worse than in the past.  He does not look volume overloaded on exam but Optivol shows fluid index above threshold.   - Continue current doses of Coreg and valsartan  - Increase Lasix to 40 mg daily and add KCl 10 mEq daily.   - BMET/BNP in 2 wks.  - Will get echocardiogram Hyperlipidemia On simvastatin, will call for last lipids from PCP's office. Marland Kitchen  OSA\ Patient feels like CPAP needs to be adjusted.  I will refer him to [facility].  Followup in 6 months.  Marca Ancona 03/13/2013

## 2013-03-13 NOTE — Patient Instructions (Addendum)
Your physician recommends that you have  lab work today--BMET/BNP/CBCd/PT/INR.  Increase lasix(furosmide) to 40mg  daily.  Your physician recommends that you return for lab work in: 2 weeks--BMET.   Your physician has requested that you have an echocardiogram. Echocardiography is a painless test that uses sound waves to create images of your heart. It provides your doctor with information about the size and shape of your heart and how well your heart's chambers and valves are working. This procedure takes approximately one hour. There are no restrictions for this procedure.  I have given you 2 written prescriptions today:  One is for Eliquis 5mg  two times a day. One is for Xarelto 20mg  daily. Check the prices and you take the one that costs the least.. Take either  Eliquis or Xarelto instead of coumadin(warfarin). Do not take both of these. You can take EITHER Eliquis, Xarelto, or warfarin (coumadin). Let me know which one you are going to take. You will need to schedule an appointment in the Cou[facility]in [facility] one month after you change medication to have lab and see how you are doing on the new medicaiton. Luana Shu [phone]  Your physician recommends that you schedule a follow-up appointment with Dr Maple Hudson in [facility].   Your physician recommends that you schedule a follow-up appointment in: 4 months with Dr Shirlee Latch.   Limit you sodium(salt) intake to 2000mg (2 grams) a day.

## 2013-03-13 NOTE — Telephone Encounter (Signed)
Called, spoke with [place] with Dr. Alford Highland office.  Pt was seen by Dr. Shirlee Latch today for SOB x couple of weeks.  Per Merita Norton, Dr. Shirlee Latch feels this is related to pt's cpap - ? If he needs a new machine.  States it isn't heart related.  Would like pt to be seen soon to address cpap.  OV scheduled for tomorrow, June 5 at 10:30 am.  Merita Norton will inform pt and have him call back if this doesn't work.

## 2013-03-14 ENCOUNTER — Ambulatory Visit (INDEPENDENT_AMBULATORY_CARE_PROVIDER_SITE_OTHER): Payer: Medicare Other | Admitting: Internal Medicine

## 2013-03-14 ENCOUNTER — Encounter: Payer: Self-pay | Admitting: Internal Medicine

## 2013-03-14 ENCOUNTER — Ambulatory Visit (INDEPENDENT_AMBULATORY_CARE_PROVIDER_SITE_OTHER)
Admission: RE | Admit: 2013-03-14 | Discharge: 2013-03-14 | Disposition: A | Discharge status: Home / Self Care | Payer: Medicare Other | Source: Ambulatory Visit | Attending: Internal Medicine | Admitting: Internal Medicine

## 2013-03-14 VITALS — BP 116/70 | HR 64 | Ht 77.0 in | Wt 265.8 lb

## 2013-03-14 DIAGNOSIS — R0609 Other forms of dyspnea: Secondary | ICD-10-CM

## 2013-03-14 DIAGNOSIS — R0600 Dyspnea, unspecified: Secondary | ICD-10-CM

## 2013-03-14 DIAGNOSIS — G4700 Insomnia, unspecified: Secondary | ICD-10-CM

## 2013-03-14 DIAGNOSIS — G4733 Obstructive sleep apnea (adult) (pediatric): Secondary | ICD-10-CM

## 2013-03-14 MED ORDER — CLONAZEPAM 0.5 MG PO TABS: ORAL_TABLET | ORAL | Status: DC

## 2013-03-14 NOTE — Progress Notes (Signed)
Patient ID: [name], male    DOB: Jan 17, 1946, 67 y.o.   MRN: [id number]  HPI 05/13/11- 67 year old man, former smoker, referred courtesy of Dr. Eric Form for sleep medicine evaluation because of sleep apnea. An NPSG 01/08/09 documented  Severe sleep apnea, AHI 38.3/ hr. He tried CPAP briefly, but was unable to tolerate it because of claustrophobia. Since then he has been bothered by persistent tiredness and decided to look into the issue again. His friends have told him that he should be on CPAP. His wife has never complained of his snoring or described apnea. He now feels "worn out". 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 He admits to daytime sleepiness while driving. A recent CPAP titration study on 04/12/2011 showed good control on CPAP at 10 CWP with an AHI of 0 per hour. Caffeine is no help. Usual bedtime 11 PM estimating sleep latency 30-45 minutes. He will wake briefly 2 or 3 times during the night with nocturia x1. Finally up between 5:30 and 6:30 AM. Past history of atrial fibrillation with defibrillator/pacemaker. He denies history of myocardial infarction, lung or thyroid disease.  06/24/11- 67 yoM followed for OSA/ hypersomnia complicated by AFib/ pacemaker/ MI He is retrying CPAP 10/ [facility] and finds it a lot better this time around. He is averaging 51/2 hours use/ night. He notes much less daytime sleepiness. Used temazepam only 2-3 times, mainly if wound up at bedtime. He made up his mind he would make this work. Compliance and control are good.   01/31/12- 67 yoM followed for OSA/ hypersomnia complicated by AFib/ pacemaker/ MI Using CPAP 10/[facility]. Uncomfortable with mask. Full facemask is too hot. Chin strap doesn't keep his mouth closed. Had some questions about how to operate the machine. I reviewed how to discuss these issues with the home care company.  03/14/13- 67 yoM followed for OSA/ hypersomnia complicated by  AFib/ pacemaker/ MI FOLLOWS FOR: staying tired all the time; wears CPAP every night -unsure about how many hours-wakes up with it off.  Per Dr Eduardo Osier as well. OSA- CPAP 10/ [facility]- prevents snore. Mask makes face hot. Humidifier caused nasal congestion. Insomnia- sleeping 4 hours w/ temazepam, not enough. We discussed alternatives and can refer to learn about oral appliances. Dyspnea- Seeing Dr Shirlee Latch for AFib/ CHB/ AICD/ diastolic CHF. Asks eval of DOE. Lasix was increased yesterday. Denies cough or wheeze.  ROS-see HPI Constitutional:   No-   weight loss, night sweats, fevers, chills, +fatigue, lassitude. HEENT:   No-  headaches, difficulty swallowing, tooth/dental problems, sore throat,       No-  sneezing, itching, ear ache, nasal congestion, post nasal drip,  CV:  No-   chest pain, orthopnea, PND, +swelling in lower extremities, anasarca, dizziness, palpitations Resp: + shortness of breath with exertion or at rest.              No-   productive cough,  No non-productive cough,  No- coughing up of blood.              No-   change in color of mucus.  No- wheezing.   Skin: No-   rash or lesions. GI:  No-   heartburn, indigestion, abdominal pain, nausea, vomiting,  GU: MS:  No-   joint pain or swelling.   Neuro-     nothing unusual Psych:  No- change in mood or affect. No depression or anxiety.  No memory loss.   OBJ- Physical Exam General- Alert, Oriented, Affect-appropriate, Distress- none acute, tall, trim Skin- rash-none, lesions- none, excoriation- none Lymphadenopathy- none Head- atraumatic            Eyes- Gross vision intact, PERRLA, conjunctivae and secretions clear            Ears- Hearing, canals-normal            Nose- Clear, no-Septal dev, mucus, polyps, erosion, perforation             Throat- Mallampati III , mucosa clear , drainage- none, tonsils- atrophic Neck- flexible , trachea midline, no stridor , thyroid nl, carotid no bruit Chest - symmetrical excursion  , unlabored           Heart/CV- RRR , no murmur , no gallop  , no rub, nl s1 s2                           - JVD- none , edema- none, stasis changes- none, varices- none           Lung- clear to P&A, wheeze- none, cough- none , dullness-none, rub- none           Chest wall- AICD L Abd-  Br/ Gen/ Rectal- Not done, not indicated Extrem- cyanosis- none, clubbing, none, atrophy- none, strength- nl. Heavy legs, old knee surgery Neuro- grossly intact to observation

## 2013-03-14 NOTE — Assessment & Plan Note (Signed)
Difficulty  Initiating and maintaining sleep, even with temazepam. Discussed sleep hygiene. Plan- - try clonazepam for longer half-life sleep med.

## 2013-03-14 NOTE — Patient Instructions (Addendum)
Order- CXR    Dx Dyspnea  Order DME [facility]- autotitrate CPAP 5-15 cwp x 7 days for pressure recommendation, mask of choice and supplies       Dx OSA  Script for clonazepam   try 1-2 tabs taken 1/2 hour before bedtime, for sleep as needed   Order-  Referral to Dr Althea Grimmer, DDS  Oral appliance evaluation for obstructive sleep apnea

## 2013-03-14 NOTE — Assessment & Plan Note (Signed)
He reports this as a concern of cardiology. History and pulmonary exam don't syuggest obvious problem separate from his heart disease. Plan- CXR

## 2013-03-14 NOTE — Assessment & Plan Note (Signed)
He has never felt really comfortably settled into using CPAP. Mask too hot, chin strap won't keep mouth closed. Plan- We discussed options. Will reassess mask fit and pressures for CPAP. Will also refer to learn about oral appliances.

## 2013-03-27 ENCOUNTER — Ambulatory Visit (HOSPITAL_COMMUNITY): Payer: Medicare Other | Attending: Cardiology | Admitting: Radiology

## 2013-03-27 ENCOUNTER — Other Ambulatory Visit (INDEPENDENT_AMBULATORY_CARE_PROVIDER_SITE_OTHER): Payer: Medicare Other

## 2013-03-27 DIAGNOSIS — I5032 Chronic diastolic (congestive) heart failure: Secondary | ICD-10-CM

## 2013-03-27 DIAGNOSIS — E7800 Pure hypercholesterolemia, unspecified: Secondary | ICD-10-CM | POA: Insufficient documentation

## 2013-03-27 DIAGNOSIS — R0602 Shortness of breath: Secondary | ICD-10-CM

## 2013-03-27 DIAGNOSIS — I4891 Unspecified atrial fibrillation: Secondary | ICD-10-CM | POA: Insufficient documentation

## 2013-03-27 DIAGNOSIS — G4733 Obstructive sleep apnea (adult) (pediatric): Secondary | ICD-10-CM | POA: Insufficient documentation

## 2013-03-27 DIAGNOSIS — I428 Other cardiomyopathies: Secondary | ICD-10-CM | POA: Insufficient documentation

## 2013-03-27 DIAGNOSIS — I10 Essential (primary) hypertension: Secondary | ICD-10-CM | POA: Insufficient documentation

## 2013-03-27 LAB — BASIC METABOLIC PANEL
BUN: 21 mg/dL (ref 6–23)
CO2: 25 mEq/L (ref 19–32)
Calcium: 9 mg/dL (ref 8.4–10.5)
GFR: 56.26 mL/min — ABNORMAL LOW (ref >60.00)
Glucose, Bld: 95 mg/dL (ref 70–99)
Sodium: 142 mEq/L (ref 135–145)

## 2013-03-27 LAB — CBC WITH DIFFERENTIAL/PLATELET
Basophils Relative: 0.4 % (ref 0.0–3.0)
Eosinophils Relative: 2.8 % (ref 0.0–5.0)
HCT: 39.7 % (ref 39.0–52.0)
Hemoglobin: 13.6 g/dL (ref 13.0–17.0)
Lymphs Abs: 0.8 10*3/uL (ref 0.7–4.0)
MCV: 98.8 fl (ref 78.0–100.0)
Monocytes Absolute: 0.4 10*3/uL (ref 0.1–1.0)
Monocytes Relative: 8.9 % (ref 3.0–12.0)
Platelets: 198 10*3/uL (ref 150.0–400.0)
RBC: 4.02 Mil/uL — ABNORMAL LOW (ref 4.22–5.81)
WBC: 4.6 10*3/uL (ref 4.5–10.5)

## 2013-03-27 NOTE — Progress Notes (Signed)
Echocardiogram performed.  

## 2013-03-31 ENCOUNTER — Telehealth: Payer: Self-pay | Admitting: Physician Assistant

## 2013-03-31 NOTE — Telephone Encounter (Signed)
Mr. [name] is a 67 year old male with a history of AV node ablation who is pacemaker dependent and has a Medtronic bi-V. ICD implanted in 2010. He states he was told in February his device had about 6 months ago. Early this morning, he noticed that device beeping. He states it has been once a day like this in the past when the device needed to be changed so he became concerned. He is not having any new symptoms and does not feel he is having any significant volume overload at this time. He does not feel like he needs to be immediately assessed.  Advised the patient I will send a message to the office to get him a call so his device can be interrogated tomorrow with further evaluation and treatment depending on those results. The patient was in agreement with this plan of care.

## 2013-04-01 ENCOUNTER — Encounter: Payer: Self-pay | Admitting: Internal Medicine

## 2013-04-01 ENCOUNTER — Telehealth: Payer: Self-pay | Admitting: Internal Medicine

## 2013-04-01 ENCOUNTER — Ambulatory Visit (INDEPENDENT_AMBULATORY_CARE_PROVIDER_SITE_OTHER): Payer: Medicare Other | Admitting: *Deleted

## 2013-04-01 DIAGNOSIS — I5032 Chronic diastolic (congestive) heart failure: Secondary | ICD-10-CM

## 2013-04-01 DIAGNOSIS — Z95810 Presence of automatic (implantable) cardiac defibrillator: Secondary | ICD-10-CM

## 2013-04-01 DIAGNOSIS — I428 Other cardiomyopathies: Secondary | ICD-10-CM

## 2013-04-01 NOTE — Telephone Encounter (Signed)
carelink transmission received---device reached ERI. Pt scheduled for 04-11-13 @ 900 with JA. LMOM in regards to reason and appt day/time.

## 2013-04-01 NOTE — Telephone Encounter (Signed)
[name] is a 66-year-old male with a history of AV node ablation who is pacemaker dependent and has a Medtronic bi-V. ICD implanted in 2010. He states he was told in February his device had about 6 months ago. Early this morning, he noticed that device beeping. He states it has been once a day like this in the past when the device needed to be changed so he became concerned. He is not having any new symptoms and does not feel he is having any significant volume overload at this time. He does not feel like he needs to be immediately assessed.  Advised the patient I will send a message to the office to get him a call so his device can be interrogated tomorrow with further evaluation and treatment depending on those results. The patient was in agreement with this plan of care. 

## 2013-04-04 LAB — REMOTE ICD DEVICE
BATTERY VOLTAGE: 2.6165 V
BRDY-0002LV: 60 {beats}/min
PACEART VT: 0
TZAT-0001SLOWVT: 1
TZAT-0001SLOWVT: 2
TZAT-0002ATACH: NEGATIVE
TZAT-0002ATACH: NEGATIVE
TZAT-0011SLOWVT: 10 ms
TZAT-0011SLOWVT: 10 ms
TZAT-0012ATACH: 150 ms
TZAT-0012FASTVT: 200 ms
TZAT-0018ATACH: NEGATIVE
TZAT-0019ATACH: 6 V
TZAT-0019ATACH: 6 V
TZAT-0019FASTVT: 8 V
TZAT-0019SLOWVT: 8 V
TZAT-0019SLOWVT: 8 V
TZAT-0020ATACH: 1.5 ms
TZAT-0020ATACH: 1.5 ms
TZAT-0020ATACH: 1.5 ms
TZAT-0020FASTVT: 1.5 ms
TZAT-0020SLOWVT: 1.5 ms
TZAT-0020SLOWVT: 1.5 ms
TZON-0003VSLOWVT: 430 ms
TZST-0001ATACH: 5
TZST-0001FASTVT: 5
TZST-0001SLOWVT: 3
TZST-0001SLOWVT: 5
TZST-0002ATACH: NEGATIVE
TZST-0002ATACH: NEGATIVE
TZST-0002FASTVT: NEGATIVE
TZST-0002FASTVT: NEGATIVE
TZST-0003SLOWVT: 20 J
TZST-0003SLOWVT: 35 J
VENTRICULAR PACING ICD: 99.94 pct
VF: 0

## 2013-04-08 ENCOUNTER — Telehealth: Payer: Self-pay | Admitting: Internal Medicine

## 2013-04-08 NOTE — Telephone Encounter (Signed)
New Prob     Pt has some questions regarding upcoming visit. Please call.

## 2013-04-08 NOTE — Telephone Encounter (Signed)
He can not make his apt this week.  Will move to 7/9 at 9am

## 2013-04-10 ENCOUNTER — Telehealth: Payer: Self-pay | Admitting: Internal Medicine

## 2013-04-10 NOTE — Telephone Encounter (Signed)
Appointment changed

## 2013-04-10 NOTE — Telephone Encounter (Signed)
New Problem  Pt states that he spoke with you(Kristen) or Tresa Endo and his appt for tomorrow is suppose to be on the 9th before his coumadin appt.  He wants to speak with you when you get a chance.

## 2013-04-11 ENCOUNTER — Encounter: Payer: Medicare Other | Admitting: Internal Medicine

## 2013-04-17 ENCOUNTER — Ambulatory Visit (INDEPENDENT_AMBULATORY_CARE_PROVIDER_SITE_OTHER): Payer: Medicare Other | Admitting: Internal Medicine

## 2013-04-17 ENCOUNTER — Encounter: Payer: Self-pay | Admitting: Internal Medicine

## 2013-04-17 ENCOUNTER — Ambulatory Visit (INDEPENDENT_AMBULATORY_CARE_PROVIDER_SITE_OTHER): Payer: Medicare Other | Admitting: *Deleted

## 2013-04-17 ENCOUNTER — Encounter: Payer: Self-pay | Admitting: *Deleted

## 2013-04-17 VITALS — BP 116/70 | HR 83 | Ht 77.0 in | Wt 261.6 lb

## 2013-04-17 DIAGNOSIS — I442 Atrioventricular block, complete: Secondary | ICD-10-CM

## 2013-04-17 DIAGNOSIS — Z01810 Encounter for preprocedural cardiovascular examination: Secondary | ICD-10-CM

## 2013-04-17 DIAGNOSIS — I4891 Unspecified atrial fibrillation: Secondary | ICD-10-CM

## 2013-04-17 DIAGNOSIS — I428 Other cardiomyopathies: Secondary | ICD-10-CM

## 2013-04-17 DIAGNOSIS — Z4502 Encounter for adjustment and management of automatic implantable cardiac defibrillator: Secondary | ICD-10-CM

## 2013-04-17 LAB — ICD DEVICE OBSERVATION
AL IMPEDENCE ICD: 589 Ohm
ATRIAL PACING ICD: 0 pct
BATTERY VOLTAGE: 2.6097 V
CHARGE TIME: 13.733 s
PACEART VT: 0
TOT-0002: 0
TOT-0006: 20100723000000
TZAT-0002ATACH: NEGATIVE
TZAT-0002ATACH: NEGATIVE
TZAT-0004SLOWVT: 8
TZAT-0004SLOWVT: 8
TZAT-0005SLOWVT: 88 pct
TZAT-0005SLOWVT: 91 pct
TZAT-0011SLOWVT: 10 ms
TZAT-0011SLOWVT: 10 ms
TZAT-0012ATACH: 150 ms
TZAT-0012FASTVT: 200 ms
TZAT-0018ATACH: NEGATIVE
TZAT-0019ATACH: 6 V
TZAT-0019ATACH: 6 V
TZAT-0019ATACH: 6 V
TZAT-0019FASTVT: 8 V
TZAT-0019SLOWVT: 8 V
TZAT-0020ATACH: 1.5 ms
TZAT-0020ATACH: 1.5 ms
TZAT-0020FASTVT: 1.5 ms
TZON-0003SLOWVT: 350 ms
TZST-0001FASTVT: 2
TZST-0001FASTVT: 5
TZST-0001SLOWVT: 4
TZST-0002FASTVT: NEGATIVE
TZST-0002FASTVT: NEGATIVE
TZST-0003SLOWVT: 20 J
TZST-0003SLOWVT: 35 J
VENTRICULAR PACING ICD: 99.95 pct

## 2013-04-17 LAB — CBC WITH DIFFERENTIAL/PLATELET
Basophils Relative: 0.3 % (ref 0.0–3.0)
Eosinophils Relative: 2.5 % (ref 0.0–5.0)
Lymphocytes Relative: 15.5 % (ref 12.0–46.0)
MCV: 99.2 fl (ref 78.0–100.0)
Monocytes Absolute: 0.4 10*3/uL (ref 0.1–1.0)
Monocytes Relative: 7.6 % (ref 3.0–12.0)
Neutrophils Relative %: 74.1 % (ref 43.0–77.0)
Platelets: 235 10*3/uL (ref 150.0–400.0)
RBC: 3.99 Mil/uL — ABNORMAL LOW (ref 4.22–5.81)
WBC: 4.7 10*3/uL (ref 4.5–10.5)

## 2013-04-17 LAB — BASIC METABOLIC PANEL
BUN: 20 mg/dL (ref 6–23)
CO2: 34 mEq/L — ABNORMAL HIGH (ref 19–32)
Chloride: 101 mEq/L (ref 96–112)
GFR: 66.35 mL/min (ref >60.00)
Glucose, Bld: 92 mg/dL (ref 70–99)
Potassium: 4.1 mEq/L (ref 3.5–5.1)
Sodium: 139 mEq/L (ref 135–145)

## 2013-04-17 NOTE — Progress Notes (Signed)
Pt was started on Eliquis 5mg  bid  for Atrial Fib  on March 13, 2013 .    Reviewed patients medication list.  Pt is not  currently on any combined P-gp and strong CYP3A4 inhibitors/inducers (ketoconazole, traconazole, ritonavir, carbamazepine, phenytoin, rifampin, St. John's wort).  Reviewed labs.  SCr 1.2 Weight 118.66kg,  Dose appropriate based on labs.   Hgb 13.6 and HCT 39.6  A full discussion of the nature of anticoagulants has been carried out.  A benefit/risk analysis has been presented to the patient, so that they understand the justification for choosing anticoagulation with Eliquis at this time.  The need for compliance is stressed.  Pt is aware to take the medication twice daily.  Side effects of potential bleeding are discussed, including unusual colored urine or stools, coughing up blood or coffee ground emesis, nose bleeds or serious fall or head trauma.  Discussed signs and symptoms of stroke. The patient should avoid any OTC items containing aspirin or ibuprofen.  Avoid alcohol consumption.   Call if any signs of abnormal bleeding.  Discussed financial obligations having no problems in obtaining Eliquis.  Next lab test test in 6 months.  Scheduled for pacemaker  generator replacement  change CRT-P on July 17th and instructed to hold Eliquis night before and morning of procedure and Dr Johney Frame will instruct when to restart Eliquis

## 2013-04-17 NOTE — Progress Notes (Signed)
I gave results of CBC and BMET being WNL.

## 2013-04-17 NOTE — Patient Instructions (Signed)
Your physician has recommended you make the following change in your medication: DECREASE  CARVEDILOL  TO  25 MG  1 TAB TWICE DAILY AND FUROSEMIDE TO 20 MG  EVER DAY Your physician recommends that you return for lab work in:  TODAY  BMET  AND CBC WITH DIFF

## 2013-04-17 NOTE — Progress Notes (Signed)
PCP:  SHAW,W DOUGLAS, MD Primary Cardiologist:  Dr McLean  The patient presents today for routine electrophysiology followup.  Since last being seen in our clinic, the patient reports doing very well. He has daytime fatigue with coreg.  Recently, he has noticed increased postural dizziness with increase in lasix from 20mg daily to 40mg daily. Today, he denies symptoms of palpitations, chest pain, shortness of breath, orthopnea, PND, lower extremity edema, presyncope, syncope, or neurologic sequela.  The patient feels that he is tolerating medications without difficulties and is otherwise without complaint today.   Past Medical History  Diagnosis Date  . Atrial fibrillation     permanent  . Hyperlipidemia   . Sleep apnea   . Arthritis   . GERD (gastroesophageal reflux disease)   . Chronic systolic dysfunction of left ventricle     s/p BiV ICD  . CAD (coronary artery disease)   . Osteoarthritis   . Elevated LFTs   . IFG (impaired fasting glucose)   . Fatigue   . Dysphagia   . Complete heart block     s/p AV nodal ablation for afib   Past Surgical History  Procedure Laterality Date  . Hernia repair    . Replacement total knee  2000, 2011 x2    bilateral  . Hydrocele excision    . Av node ablation    . Pacemaker insertion  2006  . Cardiac defibrillator placement  2007, 2010    MDT BiV ICD,  6949 RV lead, though pace/ sense is a Medtronic model 4076 - 58  lead  . Hand surgery      left    Current Outpatient Prescriptions  Medication Sig Dispense Refill  . apixaban (ELIQUIS) 5 MG TABS tablet Take by mouth 2 (two) times daily.      . carvedilol (COREG) 25 MG tablet Take 1 and 1/2 tablets twice a day  270 tablet  3  . clonazePAM (KLONOPIN) 0.5 MG tablet 1-2 for sleep as needed  30 tablet  5  . COLCRYS 0.6 MG tablet Take 1 tablet by mouth every 8 (eight) hours.      . furosemide (LASIX) 40 MG tablet Take 1 tablet (40 mg total) by mouth daily.  90 tablet  1  . simvastatin (ZOCOR)  40 MG tablet TAKE 1 TABLET BY MOUTH EVERY EVENING  90 tablet  1  . valsartan (DIOVAN) 80 MG tablet Take 1 tablet (80 mg total) by mouth daily.  30 tablet  11   No current facility-administered medications for this visit.    Allergies  Allergen Reactions  . Lisinopril   . Niacin     History   Social History  . Marital Status: Married    Spouse Name: N/A    Number of Children: 2  . Years of Education: N/A   Occupational History  . sales    Social History Main Topics  . Smoking status: Former Smoker -- 1.00 packs/day for 8 years    Types: Cigarettes    Quit date: 10/10/1997  . Smokeless tobacco: Never Used  . Alcohol Use: Yes     Comment: a 6 pack of beer a week, three glasses of wine a week  . Drug Use: No  . Sexually Active: Not on file   Other Topics Concern  . Not on file   Social History Narrative  . No narrative on file    Family History  Problem Relation Age of Onset  . Colon cancer Neg Hx   .   Brain cancer Brother     step-brother  . Emphysema Father     smoker  . Arthritis Father   . Bone cancer Maternal Uncle   . Throat cancer Maternal Aunt   . Breast cancer Cousin     maternal cousin    ROS-  All systems are reviewed and are negative except as outlined in the HPI above   Physical Exam: Filed Vitals:   04/17/13 0903  BP: 116/70  Pulse: 83  Height: 6' 5" (1.956 m)  Weight: 261 lb 9.6 oz (118.661 kg)    GEN- The patient is well appearing, alert and oriented x 3 today.   Head- normocephalic, atraumatic Eyes-  Sclera clear, conjunctiva pink Ears- hearing intact Oropharynx- clear Neck- supple, no JVP Lymph- no cervical lymphadenopathy Lungs- Clear to ausculation bilaterally, normal work of breathing Chest- ICD pocket is well healed Heart- Regular rate and rhythm (paced),   GI- soft, NT, ND, + BS Extremities- no clubbing, cyanosis, or edema MS- no significant deformity or atrophy Skin- no rash or lesion Psych- euthymic mood, full  affect Neuro- strength and sensation are intact  ICD interrogation- reviewed in detail today,  See PACEART report Echo 6/14 is reviewed and reveals normal EF  Assessment and Plan:  1. 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  His EF has normalized for more than 5 years with resynchronization.  He has reached ERI battery status on his BiV ICD. I had a long conversation with the patient today about pulse generator replacement with either a CRT-P or CRT-D device.  At this time, he would favor CRT-P at time of pulse generator replacement.  Risks, benefits, and alternatives to generator change were discussed with the patient who wishes to proceed.  We will therefore proceed with generator change at the next available time. Decrease lasix to 20mg daily Given fatigue and normalized EF, will decrease coreg to 25mg BID  2. afib Continue eliquis He will hold for 24 hours prior to generator change  3. AV block As above 

## 2013-04-19 ENCOUNTER — Encounter (HOSPITAL_COMMUNITY): Payer: Self-pay | Admitting: Pharmacy Technician

## 2013-04-24 ENCOUNTER — Encounter: Payer: Self-pay | Admitting: Internal Medicine

## 2013-04-24 MED ORDER — GENTAMICIN SULFATE 40 MG/ML IJ SOLN
80.0000 mg | INTRAMUSCULAR | Status: AC
  Filled 2013-04-24: qty 2

## 2013-04-24 MED ORDER — CEFAZOLIN SODIUM-DEXTROSE 2-3 GM-% IV SOLR
2.0000 g | INTRAVENOUS | Status: AC
  Filled 2013-04-24: qty 50

## 2013-04-25 ENCOUNTER — Encounter (HOSPITAL_COMMUNITY)
Admission: RE | Disposition: A | Discharge status: Home / Self Care | Payer: Self-pay | Source: Ambulatory Visit | Attending: Internal Medicine

## 2013-04-25 ENCOUNTER — Encounter (HOSPITAL_COMMUNITY): Payer: Self-pay | Admitting: *Deleted

## 2013-04-25 ENCOUNTER — Ambulatory Visit (HOSPITAL_COMMUNITY)
Admission: RE | Admit: 2013-04-25 | Discharge: 2013-04-25 | Disposition: A | Discharge status: Home / Self Care | Payer: Medicare Other | Source: Ambulatory Visit | Attending: Internal Medicine | Admitting: Internal Medicine

## 2013-04-25 DIAGNOSIS — Z888 Allergy status to other drugs, medicaments and biological substances status: Secondary | ICD-10-CM | POA: Insufficient documentation

## 2013-04-25 DIAGNOSIS — I509 Heart failure, unspecified: Secondary | ICD-10-CM | POA: Insufficient documentation

## 2013-04-25 DIAGNOSIS — E785 Hyperlipidemia, unspecified: Secondary | ICD-10-CM | POA: Insufficient documentation

## 2013-04-25 DIAGNOSIS — I4891 Unspecified atrial fibrillation: Secondary | ICD-10-CM | POA: Diagnosis present

## 2013-04-25 DIAGNOSIS — Z79899 Other long term (current) drug therapy: Secondary | ICD-10-CM | POA: Insufficient documentation

## 2013-04-25 DIAGNOSIS — M129 Arthropathy, unspecified: Secondary | ICD-10-CM | POA: Insufficient documentation

## 2013-04-25 DIAGNOSIS — G4730 Sleep apnea, unspecified: Secondary | ICD-10-CM | POA: Insufficient documentation

## 2013-04-25 DIAGNOSIS — M1990 Unspecified osteoarthritis, unspecified site: Secondary | ICD-10-CM | POA: Insufficient documentation

## 2013-04-25 DIAGNOSIS — I5022 Chronic systolic (congestive) heart failure: Secondary | ICD-10-CM | POA: Insufficient documentation

## 2013-04-25 DIAGNOSIS — Z87891 Personal history of nicotine dependence: Secondary | ICD-10-CM | POA: Insufficient documentation

## 2013-04-25 DIAGNOSIS — Z45018 Encounter for adjustment and management of other part of cardiac pacemaker: Secondary | ICD-10-CM | POA: Insufficient documentation

## 2013-04-25 DIAGNOSIS — Z7901 Long term (current) use of anticoagulants: Secondary | ICD-10-CM | POA: Insufficient documentation

## 2013-04-25 DIAGNOSIS — I442 Atrioventricular block, complete: Secondary | ICD-10-CM | POA: Diagnosis present

## 2013-04-25 DIAGNOSIS — K219 Gastro-esophageal reflux disease without esophagitis: Secondary | ICD-10-CM | POA: Insufficient documentation

## 2013-04-25 DIAGNOSIS — I2510 Atherosclerotic heart disease of native coronary artery without angina pectoris: Secondary | ICD-10-CM | POA: Insufficient documentation

## 2013-04-25 DIAGNOSIS — R7309 Other abnormal glucose: Secondary | ICD-10-CM | POA: Insufficient documentation

## 2013-04-25 DIAGNOSIS — I428 Other cardiomyopathies: Secondary | ICD-10-CM | POA: Diagnosis present

## 2013-04-25 HISTORY — PX: IMPLANTABLE CARDIOVERTER DEFIBRILLATOR GENERATOR CHANGE: SHX5474

## 2013-04-25 HISTORY — PX: BIV PACEMAKER GENERATOR CHANGE OUT: SHX5746

## 2013-04-25 LAB — SURGICAL PCR SCREEN
MRSA, PCR: NEGATIVE
Staphylococcus aureus: POSITIVE — AB

## 2013-04-25 SURGERY — IMPLANTABLE CARDIOVERTER DEFIBRILLATOR GENERATOR CHANGE
Anesthesia: LOCAL

## 2013-04-25 MED ORDER — ONDANSETRON HCL 4 MG/2ML IJ SOLN: 4.0000 mg | Freq: Four times a day (QID) | INTRAMUSCULAR | Status: DC | PRN

## 2013-04-25 MED ORDER — ACETAMINOPHEN 325 MG PO TABS: 325.0000 mg | ORAL_TABLET | ORAL | Status: DC | PRN

## 2013-04-25 MED ORDER — SODIUM CHLORIDE 0.9 % IV SOLN: 250.0000 mL | INTRAVENOUS | Status: DC | PRN

## 2013-04-25 MED ORDER — LIDOCAINE HCL (PF) 1 % IJ SOLN
INTRAMUSCULAR | Status: AC
  Filled 2013-04-25: qty 60

## 2013-04-25 MED ORDER — MUPIROCIN 2 % EX OINT
TOPICAL_OINTMENT | CUTANEOUS | Status: AC
  Filled 2013-04-25: qty 22

## 2013-04-25 MED ORDER — SODIUM CHLORIDE 0.9 % IJ SOLN: 3.0000 mL | Freq: Two times a day (BID) | INTRAMUSCULAR | Status: DC

## 2013-04-25 MED ORDER — SODIUM CHLORIDE 0.9 % IV SOLN
INTRAVENOUS | Status: DC
  Administered 2013-04-25: 08:00:00 via INTRAVENOUS

## 2013-04-25 MED ORDER — MUPIROCIN 2 % EX OINT
TOPICAL_OINTMENT | Freq: Two times a day (BID) | CUTANEOUS | Status: DC
  Administered 2013-04-25: 1 via NASAL
  Filled 2013-04-25: qty 22

## 2013-04-25 MED ORDER — CHLORHEXIDINE GLUCONATE 4 % EX LIQD
60.0000 mL | Freq: Once | CUTANEOUS | Status: DC
  Filled 2013-04-25: qty 60

## 2013-04-25 MED ORDER — MIDAZOLAM HCL 5 MG/5ML IJ SOLN
INTRAMUSCULAR | Status: AC
  Filled 2013-04-25: qty 5

## 2013-04-25 MED ORDER — SODIUM CHLORIDE 0.9 % IJ SOLN: 3.0000 mL | INTRAMUSCULAR | Status: DC | PRN

## 2013-04-25 MED ORDER — HYDROCODONE-ACETAMINOPHEN 5-325 MG PO TABS: 1.0000 | ORAL_TABLET | ORAL | Status: DC | PRN

## 2013-04-25 NOTE — Interval H&P Note (Signed)
History and Physical Interval Note:  04/25/2013 8:32 AM  [name]  has presented today for surgery, with the diagnosis of End of life  The various methods of treatment have been discussed with the patient and family. After consideration of risks, benefits and other options for treatment, the patient has consented to  Generator change as a surgical intervention .  The patient's history has been reviewed, patient examined, no change in status, stable for surgery.  I have reviewed the patient's chart and labs.  Questions were answered to the patient's satisfaction.    After a long discussion with the patient and his spouse, he remains clear that he would like change to CRT-P rather than CRT-D.  We will proceed accordingly.  Hillis Range

## 2013-04-25 NOTE — H&P (View-Only) (Signed)
PCP:  Kari Baars, MD Primary Cardiologist:  Dr Shirlee Latch  The patient presents today for routine electrophysiology followup.  Since last being seen in our clinic, the patient reports doing very well. He has daytime fatigue with coreg.  Recently, he has noticed increased postural dizziness with increase in lasix from 20mg  daily to 40mg  daily. Today, he denies symptoms of palpitations, chest pain, shortness of breath, orthopnea, PND, lower extremity edema, presyncope, syncope, or neurologic sequela.  The patient feels that he is tolerating medications without difficulties and is otherwise without complaint today.   Past Medical History  Diagnosis Date  . Atrial fibrillation     permanent  . Hyperlipidemia   . Sleep apnea   . Arthritis   . GERD (gastroesophageal reflux disease)   . Chronic systolic dysfunction of left ventricle     s/p BiV ICD  . CAD (coronary artery disease)   . Osteoarthritis   . Elevated LFTs   . IFG (impaired fasting glucose)   . Fatigue   . Dysphagia   . Complete heart block     s/p AV nodal ablation for afib   Past Surgical History  Procedure Laterality Date  . Hernia repair    . Replacement total knee  2000, 2011 x2    bilateral  . Hydrocele excision    . Av node ablation    . Pacemaker insertion  2006  . Cardiac defibrillator placement  2007, 2010    MDT BiV ICD,  6949 RV lead, though pace/ sense is a Medtronic model 4076 - 58  lead  . Hand surgery      left    Current Outpatient Prescriptions  Medication Sig Dispense Refill  . apixaban (ELIQUIS) 5 MG TABS tablet Take by mouth 2 (two) times daily.      . carvedilol (COREG) 25 MG tablet Take 1 and 1/2 tablets twice a day  270 tablet  3  . clonazePAM (KLONOPIN) 0.5 MG tablet 1-2 for sleep as needed  30 tablet  5  . COLCRYS 0.6 MG tablet Take 1 tablet by mouth every 8 (eight) hours.      . furosemide (LASIX) 40 MG tablet Take 1 tablet (40 mg total) by mouth daily.  90 tablet  1  . simvastatin (ZOCOR)  40 MG tablet TAKE 1 TABLET BY MOUTH EVERY EVENING  90 tablet  1  . valsartan (DIOVAN) 80 MG tablet Take 1 tablet (80 mg total) by mouth daily.  30 tablet  11   No current facility-administered medications for this visit.    Allergies  Allergen Reactions  . Lisinopril   . Niacin     History   Social History  . Marital Status: Married    Spouse Name: N/A    Number of Children: 2  . Years of Education: N/A   Occupational History  . sales    Social History Main Topics  . Smoking status: Former Smoker -- 1.00 packs/day for 8 years    Types: Cigarettes    Quit date: 10/10/1997  . Smokeless tobacco: Never Used  . Alcohol Use: Yes     Comment: a 6 pack of beer a week, three glasses of wine a week  . Drug Use: No  . Sexually Active: Not on file   Other Topics Concern  . Not on file   Social History Narrative  . No narrative on file    Family History  Problem Relation Age of Onset  . Colon cancer Neg Hx   .  Brain cancer Brother     step-brother  . Emphysema Father     smoker  . Arthritis Father   . Bone cancer Maternal Uncle   . Throat cancer Maternal Aunt   . Breast cancer Cousin     maternal cousin    ROS-  All systems are reviewed and are negative except as outlined in the HPI above   Physical Exam: Filed Vitals:   04/17/13 0903  BP: 116/70  Pulse: 83  Height: 6\' 5"  (1.956 m)  Weight: 261 lb 9.6 oz (118.661 kg)    GEN- The patient is well appearing, alert and oriented x 3 today.   Head- normocephalic, atraumatic Eyes-  Sclera clear, conjunctiva pink Ears- hearing intact Oropharynx- clear Neck- supple, no JVP Lymph- no cervical lymphadenopathy Lungs- Clear to ausculation bilaterally, normal work of breathing Chest- ICD pocket is well healed Heart- Regular rate and rhythm (paced),   GI- soft, NT, ND, + BS Extremities- no clubbing, cyanosis, or edema MS- no significant deformity or atrophy Skin- no rash or lesion Psych- euthymic mood, full  affect Neuro- strength and sensation are intact  ICD interrogation- reviewed in detail today,  See PACEART report Echo 6/14 is reviewed and reveals normal EF  Assessment and Plan:  1. 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  His EF has normalized for more than 5 years with resynchronization.  He has reached ERI battery status on his BiV ICD. I had a long conversation with the patient today about pulse generator replacement with either a CRT-P or CRT-D device.  At this time, he would favor CRT-P at time of pulse generator replacement.  Risks, benefits, and alternatives to generator change were discussed with the patient who wishes to proceed.  We will therefore proceed with generator change at the next available time. Decrease lasix to 20mg  daily Given fatigue and normalized EF, will decrease coreg to 25mg  BID  2. afib Continue eliquis He will hold for 24 hours prior to generator change  3. AV block As above

## 2013-04-25 NOTE — Brief Op Note (Signed)
BIV ICD generator change with downgrade to a BiV pacemaker due to recovery of ejection fraction and patient preference.  See dictation# [id number]

## 2013-04-25 NOTE — Discharge Instructions (Signed)
Pacemaker Battery Change A pacemaker battery usually lasts 4 to 12 years. Once or twice per year, you will be asked to visit your caregiver to have a full evaluation of your pacemaker. When a battery needs to be replaced, the entire pacemaker is actually replaced so that you can benefit from new circuitry and any new features that have recently been added to pacemakers. Most often, this procedure is very simple because the leads are already in place. After giving medicine to numb the skin, your health care provider makes a cut to reopen the pocket holding the pacemaker and disconnects the old device from its leads. The leads are routinely tested at this time. If they are working okay, the new pacemaker may simply be connected to the existing leads. If there is any problem with the old lead system, it may be wise to replace the lead system while inserting the new pacemaker. There are many things that affect how long a pacemaker battery will last:  Age of the pacemaker.  Number of leads (1, 2 or 3).  Pacemaker work load. If the pacemaker is helping the heart more often, then the battery will not last as long as if the pacemaker does not need to help the heart.  Resistance of the leads. The greater the resistance, the greater the drain on the battery. This can increase as the leads get older or if one or more of the leads does not have the best contact with the heart.  Power (voltage) settings.  The health of the person's heart. If the health of the heart gets worse, then the pacemaker may have to work more often and the setting changed to accommodate these changes. Your health care provider will be alerted to the fact that it is time to replace the battery during follow-up exams. He or she will check your pacemaker using a small table-top computer, called a programmer, and a wand. The wand is about the same size as a remote control. Your provider puts the wand on your body in the area where the  pacemaker is located. Information from the pacemaker is received about how well your heart is working and the status of the battery. It is not painful, and it usually takes just a few minutes. You will have plenty of time before the battery is fully used up to plan for replacement.  LET YOUR CAREGIVER KNOW ABOUT:   Symptoms of chest pain, trouble breathing, palpitations, lightheadedness, or feelings of an abnormal or irregular heart beat.  Allergies.  Medications taken including herbs, eye drops, over the counter medications, and creams  Use of steroids (by mouth or creams).  Possible pregnancy, if applicable.  Previous problems with anesthetics or Novocaine.  History of blood clots (thrombophlebitis).  History of bleeding or blood problems.  Surgery since your last pacemaker placement.  Other health problems. RISKS AND COMPLICATIONS These are very uncommon but include:  Bleeding.  Bruising of the skin around where the incision was made.  Pain at the site of the incision.  Pulling apart of the skin at the incision site.  Infection.  Allergic reaction to anesthetics or medicines used during the procedure. Diabetics may have a temporary increase in their blood sugar after any surgical procedure.  BEFORE THE PROCEDURE  Wash all of the skin around the area of the chest where the pacemaker is located. Try to remove any loose, scaling skin. Unless advised otherwise, avoid using aspirin, ibuprofen, or naproxen for 3-4 days before the procedure.  Ask your caregiver for help with any other medication adjustments before the pacemaker is replaced. Unless advised otherwise, do not eat or drink after midnight on the night before the procedure EXCEPT for drinking water and taking your medications as you normally would. AFTER THE PROCEDURE   A heart monitor and the pacemaker programmer will be used to make sure that the new pacemaker is working properly.  You can go home after the  procedure.  Your caregiver will advise you if you need to have any stitches. They will be removed 5-7 days after the procedure. HOME CARE INSTRUCTIONS   Keep the incision clean and dry.  Unless advised otherwise, you may shower beginning 48 hours after your procedure.  For the first week after the replacement, avoid stretching motions that pull at the incision site and avoid heavy exercise with the arm on the same side as the incision.  Only take over-the-counter or prescription medicines for pain, discomfort, or fever as directed by your caregiver.  Your caregiver will tell you when you will need to next test your pacemaker by telephone or when to return to the office for re-exam and/or removal of stitches, if necessary. SEEK MEDICAL CARE IF:   You have unusual pain at the incision site that is not adequately helped by over-the-counter or prescription medicine.  There is drainage or pus from the incision site.  You develop red streaking that extends above or below the incision site.  You feel brief intermittent palpitations, lightheadedness or any symptoms that you feel might be related to your heart. SEEK IMMEDIATE MEDICAL CARE IF:   You experience chest pain that is different than the pain at the incision site.  You experience:  Shortness of breath.  Palpitations.  Irregular heart beat.  Lightheadedness that does not go away quickly.  Fainting.  You develop a fever.  You have pain that gets worse even though you are taking pain medicine. MAKE SURE YOU:   Understand these instructions.  Will watch your condition.  Will get help right away if you are not doing well or get worse. Document Released: 01/04/2007 Document Revised: 06/20/2012 Document Reviewed: 04/09/2007 [facility] Patient Information 2014 [place], [place].

## 2013-04-26 ENCOUNTER — Telehealth: Payer: Self-pay | Admitting: Internal Medicine

## 2013-04-26 NOTE — Telephone Encounter (Signed)
Spoke w/pt in regards to bandage over wound site. Instructed to take big bandage off and leave on steri strips on to wound check. Pt understands.

## 2013-04-26 NOTE — Telephone Encounter (Signed)
Pt has questions about implant , when I called to confirm appt, pls call

## 2013-04-26 NOTE — Op Note (Signed)
NAME:  [name], [name] NO.:  [id number]  MEDICAL RECORD NO.:  [id number]  LOCATION:  [facility]                         FACILITY:  [facility]  PHYSICIAN:  Hillis Range, MD       DATE OF BIRTH:  01-12-1946  DATE OF PROCEDURE:  04/25/2013 DATE OF DISCHARGE:  04/25/2013                              OPERATIVE REPORT   SURGEON:  Hillis Range, MD.  PREPROCEDURE DIAGNOSES: 1. Complete heart block. 2. Permanent atrial fibrillation. 3. Nonischemic cardiomyopathy with chronic systolic dysfunction with     recovery of his left ventricular ejection fraction with     biventricular pacing.  POSTPROCEDURE DIAGNOSES: 1. Complete heart block. 2. Permanent atrial fibrillation. 3. Nonischemic cardiomyopathy with chronic systolic dysfunction with  PROCEDURES: 1. Biventricular ICD downgrade to a biventricular pacemaker. 2. Pacemaker pocket revision.  INTRODUCTION:  Mr. [name] is a pleasant 67 year old gentleman with a history of a nonischemic cardiomyopathy.  He previously underwent AV nodal ablation and pacemaker implantation.  He had difficulties with congestive heart failure and subsequently underwent upgrade to a biventricular ICD by Dr. Amil Amen in 2007.  He has done very well since his ICD was implanted with complete recovery of his ejection fraction. He has no symptoms of heart failure at this time.  I had a long discussion with the patient regarding options and he was very clear that he wished to proceed with downgrade of his device to a biventricular pacemaker.  DESCRIPTION OF PROCEDURE:  Informed written consent was obtained and the patient was brought to the electrophysiology lab in the fasting state. He was adequately sedated with intravenous Versed as outlined in the nursing report.  The patient's defibrillator was interrogated and confirmed to be at elective replacement indicator.  Tachycardia therapies were programed off.  The patient's left chest was prepped and draped  in the usual sterile fashion by the EP lab staff.  The skin overlying the existing device was infiltrated with lidocaine for local analgesia.  A 4-cm incision was made over the existing defibrillator pocket.  Using a combination of sharp and blunt dissection, the defibrillator was exposed and removed from the body.  The defibrillator was disconnected from the leads.  The patient's atrial lead was confirmed to be a Medtronic model [id number] (serial number [id number]) implanted on September 08, 2005.  The patient was observed to be in atrial fibrillation and therefore, this lead was not tested today.  The right ventricular pace-sense lead was a Medtronic, model [id number] (serial number [id number]) lead implanted on October 15, 2003.  R-waves were paced.  The impedance was 875 ohms and the threshold was 0.7 V at 0.5 milliseconds.  The left ventricular lead was a Medtronic model [id number] (serial number [id number]) lead implanted on September 08, 2005.  The impedance was 619 ohms with a threshold of 0.5 V at 0.4 milliseconds. All 3 leads were connected to a Medtronic Consulta model [id number] (serial number [id number]) biventricular pacemaker.  The previous SVC and RV shock coils were from a Medtronic model [id number] lead (serial number [id number]), which was implanted on November 08, 2004.  These were capped off.  The previous pace-sense portion of this lead was already capped off.  These were returned to the pocket.  The pocket was revised to accommodate the new device.  Hemostasis was assured with electrocautery. The pocket was irrigated with copious gentamicin solution.  The pacemaker was then placed into the pocket.  The pocket was then closed in 2 layers with 2-0 Vicryl suture for the subcutaneous and subcuticular layers.  Steri-Strips and a sterile dressing were then applied.  There were no early apparent complications.  CONCLUSIONS: 1. Successful pulse generator replacement with downgrade to a      Medtronic biventricular pacemaker. 2. The previous Medtronic model [id number] lead was capped and returned to     the pocket. 3. Early current complications.     Hillis Range, MD     JA/MEDQ  D:  04/25/2013  T:  04/26/2013  Job:  [id number]  cc:   Marca Ancona, MD

## 2013-04-29 ENCOUNTER — Telehealth: Payer: Self-pay | Admitting: Nurse Practitioner

## 2013-04-29 NOTE — Telephone Encounter (Signed)
Spoke with patient regarding his incision site.  Patient states there is "some" blood on the clear strips but not more than there was earlier today.  I advised patient to call us if he sees active bleeding, oozing, inflammation or opening of the wound.  Patient verbalized understanding

## 2013-05-06 ENCOUNTER — Encounter: Payer: Self-pay | Admitting: Internal Medicine

## 2013-05-06 ENCOUNTER — Ambulatory Visit (INDEPENDENT_AMBULATORY_CARE_PROVIDER_SITE_OTHER): Payer: Medicare Other | Admitting: *Deleted

## 2013-05-06 ENCOUNTER — Telehealth: Payer: Self-pay | Admitting: *Deleted

## 2013-05-06 DIAGNOSIS — I442 Atrioventricular block, complete: Secondary | ICD-10-CM

## 2013-05-06 DIAGNOSIS — I5032 Chronic diastolic (congestive) heart failure: Secondary | ICD-10-CM

## 2013-05-06 LAB — PACEMAKER DEVICE OBSERVATION
AL AMPLITUDE: 0.625 mv
AL IMPEDENCE PM: 494 Ohm
ATRIAL PACING PM: 0
LV LEAD THRESHOLD: 1 V
RV LEAD IMPEDENCE PM: 589 Ohm
RV LEAD THRESHOLD: 0.5 V

## 2013-05-06 NOTE — Telephone Encounter (Signed)
[name] had a question about changing his eliquis to xarelto because of his [employer] benefits.   I will forward this to Katina Dung, RN.

## 2013-05-06 NOTE — Telephone Encounter (Signed)
LMTCB

## 2013-05-06 NOTE — Addendum Note (Signed)
Addended by: Jacqlyn Krauss on: 05/06/2013 05:14 PM   Modules accepted: Orders, Medications

## 2013-05-06 NOTE — Telephone Encounter (Signed)
Pt states VA requesting to change from Eliquis to Xarelto. I will forward to Dr Shirlee Latch for review.

## 2013-05-06 NOTE — Telephone Encounter (Signed)
Pt notfied.

## 2013-05-06 NOTE — Progress Notes (Signed)
Wound check-BiV PPM in office.

## 2013-05-06 NOTE — Telephone Encounter (Signed)
Dr Glyn Ade wants to

## 2013-05-06 NOTE — Telephone Encounter (Signed)
OK to stop Eliquis, start Xarelto 20 mg daily.

## 2013-05-12 ENCOUNTER — Other Ambulatory Visit: Payer: Self-pay | Admitting: Cardiology

## 2013-05-13 ENCOUNTER — Other Ambulatory Visit: Payer: Self-pay | Admitting: Cardiology

## 2013-05-13 ENCOUNTER — Encounter: Payer: Self-pay | Admitting: Internal Medicine

## 2013-05-13 MED ORDER — RIVAROXABAN 20 MG PO TABS: 20.0000 mg | ORAL_TABLET | Freq: Every day | ORAL | Status: DC

## 2013-05-13 NOTE — Telephone Encounter (Signed)
Pt is going to take Xarelto instead of Eliquis at [facility] request. Pt requesting prescription to local pharmacy until he can get Xarelto from [place].

## 2013-05-14 ENCOUNTER — Encounter: Payer: Self-pay | Admitting: Cardiology

## 2013-05-14 ENCOUNTER — Ambulatory Visit (INDEPENDENT_AMBULATORY_CARE_PROVIDER_SITE_OTHER): Payer: Medicare Other | Admitting: Cardiology

## 2013-05-14 VITALS — BP 102/66 | HR 66 | Ht 77.0 in | Wt 264.8 lb

## 2013-05-14 DIAGNOSIS — Z5189 Encounter for other specified aftercare: Secondary | ICD-10-CM

## 2013-05-14 DIAGNOSIS — Z950 Presence of cardiac pacemaker: Secondary | ICD-10-CM

## 2013-05-14 NOTE — Progress Notes (Signed)
Follow-up wound check  s/p BiV ICD downgrade to BiV PPM with [name] on 04/25/2013  Mr. [name] reports clear fluid draining from his BiV PPM implant site after showering. He states it seems to dry up after his shower and form a scab. He denies CP or SOB. He denies purulent drainage, redness, warmth or swelling. He denies fever or chills.   On exam, the left upper chest implant site is intact and well healing without erythema, edema, warmth or drainage. There are 2 small areas of eschar over incision at the lateral edge. No evidence of active bleeding or hematoma. No purulent drainage. Non-tender to palpation of pocket.  Implant site appears to be well healing. Dr. Ladona Ibarra was in to interview and examine the patient with me.

## 2013-05-14 NOTE — Patient Instructions (Signed)
Your physician recommends that you continue on your current medications as directed. Please refer to the Current Medication list given to you today.     

## 2013-05-22 ENCOUNTER — Ambulatory Visit (INDEPENDENT_AMBULATORY_CARE_PROVIDER_SITE_OTHER): Payer: Medicare Other | Admitting: Internal Medicine

## 2013-05-22 DIAGNOSIS — Z9889 Other specified postprocedural states: Secondary | ICD-10-CM

## 2013-05-22 NOTE — Progress Notes (Signed)
Pt seen today for recheck of his wound Wound is healing nicely.  No drainage for fluctuance. Denies fevers or chills.  Routine follow-up advised.

## 2013-05-23 ENCOUNTER — Encounter: Payer: Self-pay | Admitting: Internal Medicine

## 2013-05-24 ENCOUNTER — Encounter: Payer: Medicare Other | Admitting: Internal Medicine

## 2013-07-05 ENCOUNTER — Encounter: Payer: Self-pay | Admitting: Internal Medicine

## 2013-07-18 ENCOUNTER — Encounter: Payer: Self-pay | Admitting: Cardiology

## 2013-07-18 NOTE — Telephone Encounter (Signed)
Pt advised rivaroxaban 20mg  OK --this was his first prescription from [place] for rivaroxaban.

## 2013-08-15 ENCOUNTER — Other Ambulatory Visit: Payer: Self-pay

## 2013-08-23 ENCOUNTER — Encounter: Payer: Medicare Other | Admitting: Internal Medicine

## 2013-08-28 ENCOUNTER — Encounter: Payer: Self-pay | Admitting: Internal Medicine

## 2013-08-28 ENCOUNTER — Ambulatory Visit (INDEPENDENT_AMBULATORY_CARE_PROVIDER_SITE_OTHER): Payer: Medicare Other | Admitting: Internal Medicine

## 2013-08-28 VITALS — BP 126/79 | HR 83 | Ht 77.0 in | Wt 272.0 lb

## 2013-08-28 DIAGNOSIS — Z950 Presence of cardiac pacemaker: Secondary | ICD-10-CM

## 2013-08-28 DIAGNOSIS — I442 Atrioventricular block, complete: Secondary | ICD-10-CM

## 2013-08-28 DIAGNOSIS — I4891 Unspecified atrial fibrillation: Secondary | ICD-10-CM

## 2013-08-28 DIAGNOSIS — I428 Other cardiomyopathies: Secondary | ICD-10-CM

## 2013-08-28 LAB — MDC_IDC_ENUM_SESS_TYPE_INCLINIC
Lead Channel Impedance Value: 627 Ohm
Lead Channel Pacing Threshold Amplitude: 0.375 V
Lead Channel Pacing Threshold Pulse Width: 0.4 ms
Lead Channel Setting Pacing Amplitude: 2 V
Lead Channel Setting Pacing Amplitude: 2.5 V
Lead Channel Setting Pacing Pulse Width: 0.4 ms

## 2013-08-28 NOTE — Patient Instructions (Signed)
Remote monitoring is used to monitor your Pacemaker of ICD from home. This monitoring reduces the number of office visits required to check your device to one time per year. It allows [place] to keep an eye on the functioning of your device to ensure it is working properly. You are scheduled for a device check from home on 11/29/2013. You may send your transmission at any time that day. If you have a wireless device, the transmission will be sent automatically. After your physician reviews your transmission, you will receive a postcard with your next transmission date.  Your physician wants you to follow-up in: 6 months with Norma Fredrickson. You will receive a reminder letter in the mail two months in advance. If you don't receive a letter, please call our office to schedule the follow-up appointment.  Your physician wants you to follow-up in: one year with Dr. Johney Frame.  You will receive a reminder letter in the mail two months in advance. If you don't receive a letter, please call our office to schedule the follow-up appointment.

## 2013-08-28 NOTE — Progress Notes (Signed)
PCP: Kari Baars, MD  [name] is a 67 y.o. male who presents today for routine electrophysiology followup.  He underwent downgrade of his CRTD to CRTP in July of this year.  His wound is well healed today and he has had no symptoms of infection.   Since last being seen in our clinic, the patient reports doing very well.  Today, he denies symptoms of palpitations, chest pain, shortness of breath,  lower extremity edema, dizziness, presyncope, or syncope.  The patient is otherwise without complaint today.   Past Medical History  Diagnosis Date  . Atrial fibrillation     permanent  . Hyperlipidemia   . Sleep apnea   . Arthritis   . GERD (gastroesophageal reflux disease)   . Chronic systolic dysfunction of left ventricle     s/p BiV ICD//downgrade to CRT-P (medtronic) on 04-25-2013 by Dr Johney Frame  . CAD (coronary artery disease)   . Osteoarthritis   . Elevated LFTs   . IFG (impaired fasting glucose)   . Fatigue   . Dysphagia   . Complete heart block     s/p AV nodal ablation for afib   Past Surgical History  Procedure Laterality Date  . Hernia repair    . Replacement total knee  2000, 2011 x2    bilateral  . Hydrocele excision    . Av node ablation    . Pacemaker insertion  2006  . Cardiac defibrillator placement  2007, 2010    MDT BiV ICD,  6949 RV lead, though pace/ sense is a Medtronic model 4076 - 58  lead; downgraded to CRTP on 04-25-2013 by Dr Johney Frame  . Hand surgery      left  . Biv pacemaker generator change out  04-25-2013    downgrade of previously implanted CRTD to Medtronic CRTP by Dr Johney Frame.  Previously implanted LV and 4076 RV leads were used    Current Outpatient Prescriptions  Medication Sig Dispense Refill  . carvedilol (COREG) 25 MG tablet Take 25 mg by mouth 2 (two) times daily with a meal.      . clonazePAM (KLONOPIN) 0.5 MG tablet Take 0.5-1 mg by mouth at bedtime as needed (sleep).      . furosemide (LASIX) 20 MG tablet Take 20 mg by mouth daily.       . Rivaroxaban (XARELTO) 20 MG TABS tablet Take 1 tablet (20 mg total) by mouth daily.  30 tablet  3  . simvastatin (ZOCOR) 40 MG tablet Take 40 mg by mouth every evening.      . valsartan (DIOVAN) 80 MG tablet Take 80 mg by mouth daily.       No current facility-administered medications for this visit.    Physical Exam: Filed Vitals:   08/28/13 1024  BP: 126/79  Pulse: 83  Height: 6\' 5"  (1.956 m)  Weight: 272 lb (123.378 kg)    GEN- The patient is well appearing, alert and oriented x 3 today.   Head- normocephalic, atraumatic Eyes-  Sclera clear, conjunctiva pink Ears- hearing intact Oropharynx- clear Lungs- Clear to ausculation bilaterally, normal work of breathing Chest- pacemaker pocket is well healed Heart- Regular rate and rhythm, no murmurs, rubs or gallops, PMI not laterally displaced GI- soft, NT, ND, + BS Extremities- no clubbing, cyanosis, or edema  Pacemaker interrogation- reviewed in detail today,  See PACEART report  Assessment and Plan:  1. 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 His EF has normalized for more than  5 years with resynchronization.  Normal CRTP device function. See PaceArt for details.   2. afib  Continue Xarelto   3. AV block  As above  Follow-up with Lawson Fiscal in 6 months [facility] I will see again in a year

## 2013-09-17 ENCOUNTER — Other Ambulatory Visit: Payer: Self-pay | Admitting: Dermatology

## 2013-09-30 ENCOUNTER — Other Ambulatory Visit: Payer: Self-pay | Admitting: Internal Medicine

## 2013-10-01 NOTE — Telephone Encounter (Signed)
Please advise if okay to refill. Thanks.  

## 2013-10-01 NOTE — Telephone Encounter (Signed)
Ok to refill now and x 5 

## 2013-10-02 ENCOUNTER — Encounter: Payer: Self-pay | Admitting: Internal Medicine

## 2013-10-02 NOTE — Telephone Encounter (Signed)
Pt aware. [name], CMA  

## 2013-10-02 NOTE — Telephone Encounter (Signed)
Med refilled.  LMTCB X1, cell phone is cut off.

## 2013-10-17 ENCOUNTER — Encounter: Payer: Self-pay | Admitting: Cardiology

## 2013-10-17 ENCOUNTER — Ambulatory Visit (INDEPENDENT_AMBULATORY_CARE_PROVIDER_SITE_OTHER): Payer: Medicare Other | Admitting: Cardiology

## 2013-10-17 VITALS — BP 110/74 | HR 69 | Ht 77.0 in | Wt 271.1 lb

## 2013-10-17 DIAGNOSIS — E7800 Pure hypercholesterolemia, unspecified: Secondary | ICD-10-CM

## 2013-10-17 DIAGNOSIS — I5032 Chronic diastolic (congestive) heart failure: Secondary | ICD-10-CM

## 2013-10-17 DIAGNOSIS — I4891 Unspecified atrial fibrillation: Secondary | ICD-10-CM

## 2013-10-17 LAB — LIPID PANEL
Cholesterol: 112 mg/dL (ref 0–200)
HDL: 38.8 mg/dL — ABNORMAL LOW (ref >39.00)
LDL Cholesterol: 62 mg/dL (ref 0–99)
Total CHOL/HDL Ratio: 3
Triglycerides: 58 mg/dL (ref 0.0–149.0)
VLDL: 11.6 mg/dL (ref 0.0–40.0)

## 2013-10-17 LAB — BASIC METABOLIC PANEL
BUN: 16 mg/dL (ref 6–23)
CALCIUM: 9.1 mg/dL (ref 8.4–10.5)
CO2: 31 mEq/L (ref 19–32)
CREATININE: 1.2 mg/dL (ref 0.4–1.5)
Chloride: 103 mEq/L (ref 96–112)
GFR: 66.24 mL/min (ref >60.00)
GLUCOSE: 100 mg/dL — AB (ref 70–99)
Potassium: 4.3 mEq/L (ref 3.5–5.1)
Sodium: 140 mEq/L (ref 135–145)

## 2013-10-17 NOTE — Progress Notes (Signed)
Patient ID: [name], male   DOB: 1946-07-06, 68 y.o.   MRN: [id number] PCP: Dr. Brigitte Pulse  68 yo with history of chronic atrial fibrillation and nonischemic cardiomyopathy presents for cardiology followup.  On last echo in 6/14, LV EF remained improved at 60-65%.  He has had an AV nodal ablation and has a Medtronic CRT-P system in place (after generator change in 7/14, he had CRT-P instead of CRT-D).  He has OSA and uses CPAP.    [name] is doing well overall.  He is still working.  He is mildly short of breath climbing a flight of steps, otherwise no significant dyspnea.  No chest pain.  No orthopnea/PND.  He is on Xarelto with no bleeding problems.  He and his wife are going to be moving back to [place].  Labs (4/13): BNP 89, K 4.3, creatinine 1.0 Labs (5/13): K 3.9, creatinine 1.1 Labs (12/13): K 4, creatinine 1.3 Labs (7/14): K 4.1, creatinine 1.2, BNP 63  ECG: Atrial fibrillation with BiV-pacing  PMH: 1. OSA on CPAP 2. Chronic atrial fibrillation: AV nodal ablation on 1/05.  3. Nonischemic cardiomyopathy: Normal cath in 2004.  Stress test in 2009 at [facility] normal.  Echo in 8/11 showed recovery of LV systolic function: EF 56-43%.  Echo in 4/13 with EF 50-55%, decreased RV systolic function, mild Thomas, PASP 32 mmHg.   Patient has a Medtronic CRT-P device.  Echo (6/14): EF 60-65%, mild RV enlargement with normal RV systolic function.  4. Dysphagia 5. Hyperlipidemia 6. HTN 7. GERD 8. Basal cell carcinoma s/p excision 9. Bilateral inguinal hernia repairs 10. Left TKR in 2007, redo right TKR in 11/10.   SH: Lives with wife in [place], sells heavy equipment, no smoking since 2002.    FH: Mother with Alzheimers  Current Outpatient Prescriptions  Medication Sig Dispense Refill  . carvedilol (COREG) 25 MG tablet Take 25 mg by mouth 2 (two) times daily with a meal.      . clonazePAM (KLONOPIN) 0.5 MG tablet TAKE 1 TO 2 TABLETS BY MOUTH AS NEEDED FOR SLEEP  30 tablet  5   . furosemide (LASIX) 20 MG tablet Take 20 mg by mouth daily.      . Rivaroxaban (XARELTO) 20 MG TABS tablet Take 1 tablet (20 mg total) by mouth daily.  30 tablet  3  . simvastatin (ZOCOR) 40 MG tablet Take 40 mg by mouth every evening.      . valsartan (DIOVAN) 80 MG tablet Take 80 mg by mouth daily.       No current facility-administered medications for this visit.    BP 110/74  Pulse 69  Ht 6\' 5"  (1.956 m)  Wt 122.979 kg (271 lb 1.9 oz)  BMI 32.14 kg/m2 General: NAD Neck: No JVD, no thyromegaly or thyroid nodule.  Lungs: Clear to auscultation bilaterally with normal respiratory effort. CV: Nondisplaced PMI.  Heart regular S1/S2, no S3/S4, no murmur.  No peripheral edema.  No carotid bruit.  Normal pedal pulses.  Abdomen: Soft, nontender, no hepatosplenomegaly, no distention.  Neurologic: Alert and oriented x 3.  Psych: Normal affect. Extremities: No clubbing or cyanosis.   Assessment/Plan:  ATRIAL FIBRILLATION  Continue Xarelto.  Stable s/p AV nodal ablation and CRT.  Nonischemic cardiomyopathy NYHA class II symptoms, euvolemic on exam.  EF was back to normal on 6/14 echo.  He now has a CRT-P system (had generator change recently, no longer has defibrillator function with normalization of EF). - Continue current doses  of Coreg, Lasix, and valsartan  - BMET today. Hyperlipidemia On simvastatin, check lipids today.  OSA Has CPAP.  Followup in 6 months.  [name] 10/17/2013

## 2013-10-17 NOTE — Patient Instructions (Signed)
Your physician recommends that you have lab work today--Lipid profile/BMET.  Your physician wants you to follow-up in: 6 months with Dr Aundra Dubin. (July 2015). You will receive a reminder letter in the mail two months in advance. If you don't receive a letter, please call our office to schedule the follow-up appointment.

## 2013-10-21 ENCOUNTER — Encounter: Payer: Self-pay | Admitting: Internal Medicine

## 2013-11-29 ENCOUNTER — Ambulatory Visit (INDEPENDENT_AMBULATORY_CARE_PROVIDER_SITE_OTHER): Payer: Medicare Other | Admitting: *Deleted

## 2013-11-29 ENCOUNTER — Encounter: Payer: Self-pay | Admitting: Internal Medicine

## 2013-11-29 DIAGNOSIS — I442 Atrioventricular block, complete: Secondary | ICD-10-CM

## 2013-11-29 LAB — MDC_IDC_ENUM_SESS_TYPE_REMOTE
Brady Statistic AP VP Percent: 0 %
Brady Statistic AP VS Percent: 0 %
Brady Statistic AS VP Percent: 99.9 %
Brady Statistic AS VS Percent: 0.1 %
Brady Statistic RA Percent Paced: 0 %
Brady Statistic RV Percent Paced: 99.9 %
Lead Channel Impedance Value: 399 Ohm
Lead Channel Impedance Value: 437 Ohm
Lead Channel Impedance Value: 741 Ohm
Lead Channel Impedance Value: 779 Ohm
Lead Channel Pacing Threshold Amplitude: 0.375 V
Lead Channel Sensing Intrinsic Amplitude: 1 mV
Lead Channel Sensing Intrinsic Amplitude: 4 mV
Lead Channel Setting Pacing Amplitude: 2 V
Lead Channel Setting Pacing Amplitude: 2 V
Lead Channel Setting Pacing Pulse Width: 0.4 ms
Lead Channel Setting Sensing Sensitivity: 4 mV
MDC IDC MSMT BATTERY REMAINING LONGEVITY: 64 mo
MDC IDC MSMT BATTERY VOLTAGE: 3.01 V
MDC IDC MSMT LEADCHNL LV IMPEDANCE VALUE: 494 Ohm
MDC IDC MSMT LEADCHNL LV IMPEDANCE VALUE: 608 Ohm
MDC IDC MSMT LEADCHNL RA IMPEDANCE VALUE: 532 Ohm
MDC IDC MSMT LEADCHNL RV IMPEDANCE VALUE: 570 Ohm
MDC IDC MSMT LEADCHNL RV IMPEDANCE VALUE: 627 Ohm
MDC IDC MSMT LEADCHNL RV PACING THRESHOLD PULSEWIDTH: 0.4 ms
MDC IDC SESS DTM: 20150220082123
MDC IDC SET LEADCHNL LV PACING PULSEWIDTH: 0.4 ms
MDC IDC SET ZONE DETECTION INTERVAL: 400 ms
Zone Setting Detection Interval: 400 ms

## 2013-12-09 ENCOUNTER — Encounter: Payer: Self-pay | Admitting: *Deleted

## 2014-02-24 ENCOUNTER — Ambulatory Visit (INDEPENDENT_AMBULATORY_CARE_PROVIDER_SITE_OTHER): Payer: Medicare Other | Admitting: Nurse Practitioner

## 2014-02-24 ENCOUNTER — Encounter: Payer: Self-pay | Admitting: Nurse Practitioner

## 2014-02-24 VITALS — BP 100/70 | HR 66 | Ht 77.0 in | Wt 270.1 lb

## 2014-02-24 DIAGNOSIS — I10 Essential (primary) hypertension: Secondary | ICD-10-CM

## 2014-02-24 DIAGNOSIS — I4891 Unspecified atrial fibrillation: Secondary | ICD-10-CM

## 2014-02-24 DIAGNOSIS — I428 Other cardiomyopathies: Secondary | ICD-10-CM

## 2014-02-24 MED ORDER — CARVEDILOL 25 MG PO TABS: 12.5000 mg | ORAL_TABLET | Freq: Two times a day (BID) | ORAL | Status: DC

## 2014-02-24 NOTE — Progress Notes (Signed)
[name] Date of Birth: 1946-01-11 Medical Record #[id number]  History of Present Illness: [name] is seen back today for a 6 month check. Seen for Dr. Zada Finders. Former patient of Dr. Susa Simmonds. He has OSA on CPAP, chronic atrial fib with past AV nodal ablation in 2005, NICM with normal cath in 2004 and had recovery of LV systolic function on echo in 2011, has CRT-P in place (no longer has defib function with normalization of his EF), dysphagia, HLD, HTN, GERD, and on chronic anticoagulation - on Xarelto. Last seen here in January - was doing ok.   Comes in today. Here alone. Has moved to the [facility] area just as of a couple of weeks ago. Doing well but notes more fatigue, more dizziness. No chest pain. Remains short of breath which is chronic - does not feel like it is worse. No regular exercise. Wants to get off some of his medicines but realizes he needs "to lose 20 pounds". No problems with bleeding or bruising. Labs checked by Dr. Brigitte Pulse.   Current Outpatient Prescriptions  Medication Sig Dispense Refill  . carvedilol (COREG) 25 MG tablet Take 25 mg by mouth 2 (two) times daily with a meal.      . clonazePAM (KLONOPIN) 0.5 MG tablet TAKE 1 TO 2 TABLETS BY MOUTH AS NEEDED FOR SLEEP  30 tablet  5  . colchicine 0.6 MG tablet Take 0.6 mg by mouth as needed.      . furosemide (LASIX) 20 MG tablet Take 20 mg by mouth daily.      . Rivaroxaban (XARELTO) 20 MG TABS tablet Take 1 tablet (20 mg total) by mouth daily.  30 tablet  3  . simvastatin (ZOCOR) 40 MG tablet Take 40 mg by mouth every evening.      . valsartan (DIOVAN) 80 MG tablet Take 80 mg by mouth daily.       No current facility-administered medications for this visit.    Allergies  Allergen Reactions  . Lisinopril     unk  . Niacin     unk    Past Medical History  Diagnosis Date  . Atrial fibrillation     permanent  . Hyperlipidemia   . Sleep apnea   . Arthritis   . GERD (gastroesophageal reflux disease)   .  Chronic systolic dysfunction of left ventricle     s/p BiV ICD//downgrade to CRT-P (medtronic) on 04-25-2013 by Dr Rayann Heman  . CAD (coronary artery disease)   . Osteoarthritis   . Elevated LFTs   . IFG (impaired fasting glucose)   . Fatigue   . Dysphagia   . Complete heart block     s/p AV nodal ablation for afib    Past Surgical History  Procedure Laterality Date  . Hernia repair    . Replacement total knee  2000, 2011 x2    bilateral  . Hydrocele excision    . Av node ablation    . Pacemaker insertion  2006  . Cardiac defibrillator placement  2007, 2010    MDT BiV ICD,  6949 RV lead, though pace/ sense is a Medtronic model 4076 - 58  lead; downgraded to CRTP on 04-25-2013 by Dr Rayann Heman  . Hand surgery      left  . Biv pacemaker generator change out  04-25-2013    downgrade of previously implanted CRTD to Medtronic CRTP by Dr Rayann Heman.  Previously implanted LV and 4076 RV leads were used    History  Smoking status  . Former Smoker -- 1.00 packs/day for 8 years  . Types: Cigarettes  . Quit date: 10/10/1997  Smokeless tobacco  . Never Used    History  Alcohol Use  . Yes    Comment: a 6 pack of beer a week, three glasses of wine a week    Family History  Problem Relation Age of Onset  . Colon cancer Neg Hx   . Brain cancer Brother     step-brother  . Emphysema Father     smoker  . Arthritis Father   . Bone cancer Maternal Uncle   . Throat cancer Maternal Aunt   . Breast cancer Cousin     maternal cousin    Review of Systems: The review of systems is per the HPI.  All other systems were reviewed and are negative.  Physical Exam: BP 100/70  Pulse 66  Ht 6\' 5"  (1.956 m)  Wt 270 lb 1.9 oz (122.526 kg)  BMI 32.03 kg/m2  SpO2 99% Repeat BP by me is 90/70. Patient is very pleasant and in no acute distress. He is of tall stature. Skin is warm and dry. Color is normal.  HEENT is unremarkable. Normocephalic/atraumatic. PERRL. Sclera are nonicteric. Neck is supple. No  masses. No JVD. Lungs are clear. Cardiac exam shows a fairly regular rhythm. Rate ok. Abdomen is soft. Extremities are without edema. Gait and ROM are intact. No gross neurologic deficits noted.  Wt Readings from Last 3 Encounters:  02/24/14 270 lb 1.9 oz (122.526 kg)  10/17/13 271 lb 1.9 oz (122.979 kg)  08/28/13 272 lb (123.378 kg)    LABORATORY DATA:  Lab Results  Component Value Date   WBC 4.7 04/17/2013   HGB 13.6 04/17/2013   HCT 39.6 04/17/2013   PLT 235.0 04/17/2013   GLUCOSE 100* 10/17/2013   CHOL 112 10/17/2013   TRIG 58.0 10/17/2013   HDL 38.80* 10/17/2013   LDLCALC 62 10/17/2013   ALT 20 08/24/2009   AST 20 08/24/2009   NA 140 10/17/2013   K 4.3 10/17/2013   CL 103 10/17/2013   CREATININE 1.2 10/17/2013   BUN 16 10/17/2013   CO2 31 10/17/2013   INR 2.5* 03/13/2013     Echo Study Conclusions from June 2014  - Left ventricle: The cavity size was normal. Wall thickness was normal. Systolic function was normal. The estimated ejection fraction was in the range of 60% to 65%. Indeterminant diastolic function. Wall motion was normal; there were no regional wall motion abnormalities. - Aortic valve: There was no stenosis. - Mitral valve: Trivial regurgitation. - Left atrium: The atrium was moderately dilated. - Right ventricle: The cavity size was mildly dilated. Pacer wire or catheter noted in right ventricle. Systolic function was normal. - Right atrium: The atrium was moderately dilated. - Tricuspid valve: Peak RV-RV gradient 28 mmHg. - Pulmonary arteries: PA peak pressure: 61mm Hg (S). - Inferior vena cava: The vessel was normal in size; the respirophasic diameter changes were in the normal range (= 50%); findings are consistent with normal central venous pressure. Impressions:  - Normal LV size and systolic function, EF 29-56%. Mildly dilated RV with normal systolic function. Moderate biatrial enlargement. No significant valvular abnormality.    Assessment / Plan: 1. Chronic  atrial fib - prior AV nodal ablation - remains on Xarelto.  2. Chronic anticoagulation - no problems noted.   3. NICM - LV function has recovered - has CRT-P in place - no symptoms.   4.  HTN - BP low - he is symptomatic - will try to cut the Coreg in half. I have asked him to try and monitor his BP at home for [place] as well.   5. HLD - on statin therapy.  See back in August as planned. Coreg cut back today. Will be due for follow up labs on return.   Patient is agreeable to this plan and will call if any problems develop in the interim.   Burtis Junes, RN, [facility] [address] [place], [place]  [id number] [phone]

## 2014-02-24 NOTE — Patient Instructions (Addendum)
Stay on your current medicines but cut the Coreg in half - to 12.5 mg two times a day  See Dr. Aundra Dubin in August as planned  Try to check some blood pressures for [place]  Work on your weight  Call the [place] office at [phone] if you have any questions, problems or concerns.

## 2014-03-04 ENCOUNTER — Telehealth: Payer: Self-pay | Admitting: Cardiology

## 2014-03-04 ENCOUNTER — Encounter: Payer: Medicare Other | Admitting: *Deleted

## 2014-03-04 NOTE — Telephone Encounter (Signed)
LMOVM reminding pt to send remote transmission.   

## 2014-03-07 ENCOUNTER — Encounter: Payer: Self-pay | Admitting: Cardiology

## 2014-03-12 ENCOUNTER — Encounter: Payer: Self-pay | Admitting: Internal Medicine

## 2014-03-12 ENCOUNTER — Ambulatory Visit: Payer: Medicare Other | Admitting: Internal Medicine

## 2014-03-12 VITALS — BP 114/66 | HR 67 | Ht 77.0 in | Wt 266.8 lb

## 2014-03-12 DIAGNOSIS — G4733 Obstructive sleep apnea (adult) (pediatric): Secondary | ICD-10-CM

## 2014-03-12 DIAGNOSIS — I5032 Chronic diastolic (congestive) heart failure: Secondary | ICD-10-CM

## 2014-03-12 NOTE — Progress Notes (Signed)
Patient ID: [name], male    DOB: 1946-07-10, 68 y.o.   MRN: [id number]  HPI 05/13/11- 68 year old man, former smoker, referred courtesy of Dr. Lang Snow for sleep medicine evaluation because of sleep apnea. An NPSG 01/08/09 documented  Severe sleep apnea, AHI 38.3/ hr. He tried CPAP briefly, but was unable to tolerate it because of claustrophobia. Since then he has been bothered by persistent tiredness and decided to look into the issue again. His friends have told him that he should be on CPAP. His wife has never complained of his snoring or described apnea. He now feels "worn out". 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 He admits to daytime sleepiness while driving. A recent CPAP titration study on 04/12/2011 showed good control on CPAP at 10 CWP with an AHI of 0 per hour. Caffeine is no help. Usual bedtime 11 PM estimating sleep latency 30-45 minutes. He will wake briefly 2 or 3 times during the night with nocturia x1. Finally up between 5:30 and 6:30 AM. Past history of atrial fibrillation with defibrillator/pacemaker. He denies history of myocardial infarction, lung or thyroid disease.  06/24/11- 68 yoM followed for OSA/ hypersomnia complicated by AFib/ pacemaker/ MI He is retrying CPAP 10/ [facility] and finds it a lot better this time around. He is averaging 51/2 hours use/ night. He notes much less daytime sleepiness. Used temazepam only 2-3 times, mainly if wound up at bedtime. He made up his mind he would make this work. Compliance and control are good.   01/31/12- 68 yoM followed for OSA/ hypersomnia complicated by AFib/ pacemaker/ MI Using CPAP 10/[facility]. Uncomfortable with mask. Full facemask is too hot. Chin strap doesn't keep his mouth closed. Had some questions about how to operate the machine. I reviewed how to discuss these issues with the home care company.  03/14/13- 68 yoM followed for OSA/ hypersomnia complicated by  AFib/ pacemaker/ MI FOLLOWS FOR: staying tired all the time; wears CPAP every night -unsure about how many hours-wakes up with it off.  Per Dr Rennis Petty as well. OSA- CPAP 10/ [facility]- prevents snore. Mask makes face hot. Humidifier caused nasal congestion. Insomnia- sleeping 4 hours w/ temazepam, not enough. We discussed alternatives and can refer to learn about oral appliances. Dyspnea- Seeing Dr Aundra Dubin for AFib/ CHB/ AICD/ diastolic CHF. Asks eval of DOE. Lasix was increased yesterday. Denies cough or wheeze.  03/12/14- 68 yoM former smoker followed for OSA/ hypersomnia complicated by AFib/ AICD/ MI /dCHF/CM, GERD FOLLOWS FOR: wears CPAP 10 every night for about 5 hours; DME is [facility] but buys supplies online. Tried oral appliance-didn't work well and hurt his jaw.  CXR- 03/14/13 Findings: Left AICD remains in place, unchanged. Heart and  mediastinal contours are within normal limits. No focal opacities  or effusions. No acute bony abnormality.  IMPRESSION:  No active cardiopulmonary disease.  Original Report Authenticated By: Rolm Baptise, M.D.  ROS-see HPI Constitutional:   No-   weight loss, night sweats, fevers, chills, +fatigue, lassitude. HEENT:   No-  headaches, difficulty swallowing, tooth/dental problems, sore throat,       No-  sneezing, itching, ear ache, nasal congestion, post nasal drip,  CV:  No-   chest pain, orthopnea, PND, +swelling in lower extremities, anasarca, dizziness, palpitations Resp: + shortness of breath with exertion or at rest.              No-  productive cough,  No non-productive cough,  No- coughing up of blood.              No-   change in color of mucus.  No- wheezing.   Skin: No-   rash or lesions. GI:  No-   heartburn, indigestion, abdominal pain, nausea, vomiting,  GU: MS:  No-   joint pain or swelling.   Neuro-     nothing unusual Psych:  No- change in mood or affect. No depression or anxiety.  No memory loss.   OBJ- Physical Exam General-  Alert, Oriented, Affect-appropriate, Distress- none acute, tall, trim Skin- rash-none, lesions- none, excoriation- none Lymphadenopathy- none Head- atraumatic            Eyes- Gross vision intact, PERRLA, conjunctivae and secretions clear            Ears- Hearing, canals-normal            Nose- Clear, no-Septal dev, mucus, polyps, erosion, perforation             Throat- Mallampati III , mucosa clear , drainage- none, tonsils- atrophic Neck- flexible , trachea midline, no stridor , thyroid nl, carotid no bruit Chest - symmetrical excursion , unlabored           Heart/CV- RRR , no murmur , no gallop  , no rub, nl s1 s2                           - JVD- none , edema- none, stasis changes- none, varices- none           Lung- clear to P&A, wheeze- none, cough- none , dullness-none, rub- none           Chest wall- AICD L Abd-  Br/ Gen/ Rectal- Not done, not indicated Extrem- cyanosis- none, clubbing, none, atrophy- none, strength- nl. Heavy legs, old knee surgery Neuro- grossly intact to observation

## 2014-03-12 NOTE — Patient Instructions (Signed)
Order- DME [facility]- download CPAP for pressure compliance      Dx OSA  Please call as needed

## 2014-03-21 ENCOUNTER — Encounter: Payer: Self-pay | Admitting: Nurse Practitioner

## 2014-03-21 NOTE — Telephone Encounter (Signed)
Called patient in response to this Email. He has complaints of pain in the right shoulder/arm/hand on and off since last night. Denies any injury or excessive use to the area. No complaints of SOB or palpitations. BP is 115/76 and HR is 65. Advised him to call his PCP to be seen. He agreed to above.

## 2014-05-12 ENCOUNTER — Encounter: Payer: Self-pay | Admitting: Cardiology

## 2014-05-12 ENCOUNTER — Ambulatory Visit (INDEPENDENT_AMBULATORY_CARE_PROVIDER_SITE_OTHER): Payer: Medicare Other | Admitting: Cardiology

## 2014-05-12 VITALS — BP 110/74 | HR 86 | Ht 77.0 in | Wt 264.0 lb

## 2014-05-12 DIAGNOSIS — I428 Other cardiomyopathies: Secondary | ICD-10-CM

## 2014-05-12 DIAGNOSIS — I482 Chronic atrial fibrillation, unspecified: Secondary | ICD-10-CM

## 2014-05-12 DIAGNOSIS — I4891 Unspecified atrial fibrillation: Secondary | ICD-10-CM

## 2014-05-12 LAB — BASIC METABOLIC PANEL
BUN: 18 mg/dL (ref 6–23)
CALCIUM: 8.8 mg/dL (ref 8.4–10.5)
CHLORIDE: 102 meq/L (ref 96–112)
CO2: 27 meq/L (ref 19–32)
CREATININE: 1.1 mg/dL (ref 0.4–1.5)
GFR: 71.01 mL/min (ref >60.00)
GLUCOSE: 105 mg/dL — AB (ref 70–99)
Potassium: 4.1 mEq/L (ref 3.5–5.1)
Sodium: 134 mEq/L — ABNORMAL LOW (ref 135–145)

## 2014-05-12 LAB — CBC WITH DIFFERENTIAL/PLATELET
Basophils Absolute: 0 10*3/uL (ref 0.0–0.1)
Basophils Relative: 0.4 % (ref 0.0–3.0)
EOS PCT: 4.3 % (ref 0.0–5.0)
Eosinophils Absolute: 0.2 10*3/uL (ref 0.0–0.7)
HEMATOCRIT: 39.7 % (ref 39.0–52.0)
HEMOGLOBIN: 13.5 g/dL (ref 13.0–17.0)
LYMPHS ABS: 0.7 10*3/uL (ref 0.7–4.0)
Lymphocytes Relative: 15.6 % (ref 12.0–46.0)
MCHC: 33.9 g/dL (ref 30.0–36.0)
MCV: 98.3 fl (ref 78.0–100.0)
MONOS PCT: 7.8 % (ref 3.0–12.0)
Monocytes Absolute: 0.3 10*3/uL (ref 0.1–1.0)
NEUTROS ABS: 3.2 10*3/uL (ref 1.4–7.7)
Neutrophils Relative %: 71.9 % (ref 43.0–77.0)
Platelets: 202 10*3/uL (ref 150.0–400.0)
RBC: 4.04 Mil/uL — AB (ref 4.22–5.81)
RDW: 13.3 % (ref 11.5–15.5)
WBC: 4.4 10*3/uL (ref 4.0–10.5)

## 2014-05-12 NOTE — Progress Notes (Signed)
Patient ID: [name], male   DOB: Jul 20, 1946, 68 y.o.   MRN: [id number] PCP: Dr. Brigitte Pulse  68 yo with history of chronic atrial fibrillation and nonischemic cardiomyopathy presents for cardiology followup.  On last echo in 6/14, LV EF remained improved at 60-65%.  He has had an AV nodal ablation and has a Medtronic CRT-P system in place (after generator change in 7/14, he had CRT-P instead of CRT-D).  He has OSA and uses CPAP.    [name] is doing well overall.  He is still working.  He is mildly short of breath climbing a flight of steps, otherwise no significant dyspnea.  No chest pain.  No orthopnea/PND.  He is on Xarelto with no bleeding problems.  At last appointment, BP was on the lower side and Coreg was decreased.  BP is ok today, no lightheadedness.  He and his wife now live in [place].  Weight is down 6 lbs.   Labs (4/13): BNP 89, K 4.3, creatinine 1.0 Labs (5/13): K 3.9, creatinine 1.1 Labs (12/13): K 4, creatinine 1.3 Labs (7/14): K 4.1, creatinine 1.2, BNP 63  ECG: Atrial fibrillation with BiV-pacing  PMH: 1. OSA on CPAP 2. Chronic atrial fibrillation: AV nodal ablation on 1/05.  3. Nonischemic cardiomyopathy: Normal cath in 2004.  Stress test in 2009 at [facility] normal.  Echo in 8/11 showed recovery of LV systolic function: EF 09-38%.  Echo in 4/13 with EF 50-55%, decreased RV systolic function, mild Thomas, PASP 32 mmHg.   Patient has a Medtronic CRT-P device.  Echo (6/14): EF 60-65%, mild RV enlargement with normal RV systolic function.  4. Dysphagia 5. Hyperlipidemia 6. HTN 7. GERD 8. Basal cell carcinoma s/p excision 9. Bilateral inguinal hernia repairs 10. Left TKR in 2007, redo right TKR in 11/10.   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    FH: Mother with Alzheimers  Current Outpatient Prescriptions  Medication Sig Dispense Refill  . carvedilol (COREG) 25 MG tablet Take 0.5 tablets (12.5 mg total) by mouth 2  (two) times daily with a meal.      . clonazePAM (KLONOPIN) 0.5 MG tablet TAKE 1 TO 2 TABLETS BY MOUTH AS NEEDED FOR SLEEP  30 tablet  5  . colchicine 0.6 MG tablet Take 0.6 mg by mouth as needed.      . furosemide (LASIX) 20 MG tablet Take 20 mg by mouth daily.      . Rivaroxaban (XARELTO) 20 MG TABS tablet Take 1 tablet (20 mg total) by mouth daily.  30 tablet  3  . simvastatin (ZOCOR) 40 MG tablet Take 40 mg by mouth every evening.      . valsartan (DIOVAN) 80 MG tablet Take 80 mg by mouth daily.       No current facility-administered medications for this visit.    BP 110/74  Pulse 86  Ht 6\' 5"  (1.956 m)  Wt 264 lb (119.75 kg)  BMI 31.30 kg/m2 General: NAD Neck: No JVD, no thyromegaly or thyroid nodule.  Lungs: Clear to auscultation bilaterally with normal respiratory effort. CV: Nondisplaced PMI.  Heart regular S1/S2, no S3/S4, no murmur.  No peripheral edema.  No carotid bruit.  Normal pedal pulses.  Abdomen: Soft, nontender, no hepatosplenomegaly, no distention.  Neurologic: Alert and oriented x 3.  Psych: Normal affect. Extremities: No clubbing or cyanosis.   Assessment/Plan:  ATRIAL FIBRILLATION  Continue Xarelto.  Stable s/p AV nodal ablation and  CRT.  CBC today.  Nonischemic cardiomyopathy NYHA class II symptoms, euvolemic on exam.  EF was back to normal on 6/14 echo.  He now has a CRT-P system (had generator change recently, no longer has defibrillator function with normalization of EF). - Continue current doses of Coreg, Lasix, and valsartan  - BMET today. - Echo in 1 year.  Hyperlipidemia On simvastatin OSA Has CPAP.  Followup in 6 months.  Loralie Champagne 05/12/2014

## 2014-05-12 NOTE — Patient Instructions (Signed)
Your physician recommends that you have lab work today--BMET/CBCd.  Your physician wants you to follow-up in: 6 months with Dr Aundra Dubin. (February 2016). You will receive a reminder letter in the mail two months in advance. If you don't receive a letter, please call our office to schedule the follow-up appointment.

## 2014-05-15 NOTE — Assessment & Plan Note (Signed)
Good compliance and control with CPAP 10/Advanced Plan-download for pressure compliance

## 2014-05-15 NOTE — Assessment & Plan Note (Signed)
Managed by cardiology 

## 2014-07-24 ENCOUNTER — Encounter: Payer: Self-pay | Admitting: *Deleted

## 2014-08-27 ENCOUNTER — Encounter: Payer: Self-pay | Admitting: Internal Medicine

## 2014-08-27 ENCOUNTER — Ambulatory Visit (INDEPENDENT_AMBULATORY_CARE_PROVIDER_SITE_OTHER): Payer: Medicare Other | Admitting: Internal Medicine

## 2014-08-27 VITALS — BP 110/70 | HR 63 | Ht 77.0 in | Wt 260.0 lb

## 2014-08-27 DIAGNOSIS — I5032 Chronic diastolic (congestive) heart failure: Secondary | ICD-10-CM

## 2014-08-27 DIAGNOSIS — I4891 Unspecified atrial fibrillation: Secondary | ICD-10-CM

## 2014-08-27 DIAGNOSIS — I428 Other cardiomyopathies: Secondary | ICD-10-CM

## 2014-08-27 DIAGNOSIS — I429 Cardiomyopathy, unspecified: Secondary | ICD-10-CM

## 2014-08-27 DIAGNOSIS — I442 Atrioventricular block, complete: Secondary | ICD-10-CM

## 2014-08-27 DIAGNOSIS — G4733 Obstructive sleep apnea (adult) (pediatric): Secondary | ICD-10-CM

## 2014-08-27 LAB — MDC_IDC_ENUM_SESS_TYPE_INCLINIC
Battery Remaining Longevity: 51 mo
Brady Statistic AS VS Percent: 0.16 %
Date Time Interrogation Session: 20151118120251
Lead Channel Impedance Value: 475 Ohm
Lead Channel Impedance Value: 532 Ohm
Lead Channel Impedance Value: 703 Ohm
Lead Channel Pacing Threshold Amplitude: 0.375 V
Lead Channel Sensing Intrinsic Amplitude: 1 mV
Lead Channel Sensing Intrinsic Amplitude: 1.5 mV
Lead Channel Setting Pacing Amplitude: 2 V
MDC IDC MSMT BATTERY VOLTAGE: 3 V
MDC IDC MSMT LEADCHNL LV IMPEDANCE VALUE: 361 Ohm
MDC IDC MSMT LEADCHNL LV IMPEDANCE VALUE: 589 Ohm
MDC IDC MSMT LEADCHNL LV IMPEDANCE VALUE: 741 Ohm
MDC IDC MSMT LEADCHNL RA IMPEDANCE VALUE: 437 Ohm
MDC IDC MSMT LEADCHNL RV IMPEDANCE VALUE: 551 Ohm
MDC IDC MSMT LEADCHNL RV IMPEDANCE VALUE: 608 Ohm
MDC IDC MSMT LEADCHNL RV PACING THRESHOLD PULSEWIDTH: 0.4 ms
MDC IDC SET LEADCHNL LV PACING AMPLITUDE: 2 V
MDC IDC SET LEADCHNL LV PACING PULSEWIDTH: 0.4 ms
MDC IDC SET LEADCHNL RV PACING PULSEWIDTH: 0.4 ms
MDC IDC SET LEADCHNL RV SENSING SENSITIVITY: 4 mV
MDC IDC SET ZONE DETECTION INTERVAL: 400 ms
MDC IDC STAT BRADY AP VP PERCENT: 0 %
MDC IDC STAT BRADY AP VS PERCENT: 0 %
MDC IDC STAT BRADY AS VP PERCENT: 99.84 %
MDC IDC STAT BRADY RA PERCENT PACED: 0 %
MDC IDC STAT BRADY RV PERCENT PACED: 99.84 %
Zone Setting Detection Interval: 400 ms

## 2014-08-27 NOTE — Patient Instructions (Addendum)
Your physician wants you to follow-up in: 12 months with Dr. Vallery Ridge will receive a reminder letter in the mail two months in advance. If you don't receive a letter, please call our office to schedule the follow-up appointment.   Remote monitoring is used to monitor your Pacemaker or ICD from home. This monitoring reduces the number of office visits required to check your device to one time per year. It allows [place] to keep an eye on the functioning of your device to ensure it is working properly. You are scheduled for a device check from home on 11/27/14. You may send your transmission at any time that day. If you have a wireless device, the transmission will be sent automatically. After your physician reviews your transmission, you will receive a postcard with your next transmission date.   You have been referred to Dr. Carlena Sax for sleep apnea

## 2014-08-27 NOTE — Progress Notes (Signed)
PCP: Thomas Redwood, MD Primary Cardiologist:  Dr Sherrilyn Rist is a 68 y.o. male who presents today for routine electrophysiology followup.  Since last being seen in our clinic, the patient reports doing very well.  Today, he denies symptoms of palpitations, chest pain, shortness of breath,  lower extremity edema, dizziness, presyncope, or syncope.  The patient is otherwise without complaint today.   Past Medical History  Diagnosis Date  . Atrial fibrillation     permanent  . Hyperlipidemia   . Sleep apnea   . Arthritis   . GERD (gastroesophageal reflux disease)   . Chronic systolic dysfunction of left ventricle     s/p BiV ICD//downgrade to CRT-P (medtronic) on 04-25-2013 by Dr Rayann Heman  . CAD (coronary artery disease)   . Osteoarthritis   . Elevated LFTs   . IFG (impaired fasting glucose)   . Fatigue   . Dysphagia   . Complete heart block     s/p AV nodal ablation for afib   Past Surgical History  Procedure Laterality Date  . Hernia repair    . Replacement total knee  2000, 2011 x2    bilateral  . Hydrocele excision    . Av node ablation    . Pacemaker insertion  2006  . Cardiac defibrillator placement  2007, 2010    MDT BiV ICD,  6949 RV lead, though pace/ sense is a Medtronic model 4076 - 58  lead; downgraded to CRTP on 04-25-2013 by Dr Rayann Heman  . Hand surgery      left  . Biv pacemaker generator change out  04-25-2013    downgrade of previously implanted CRTD to Medtronic CRTP by Dr Rayann Heman.  Previously implanted LV and 4076 RV leads were used    ROS- all systems are reviewed and negative except as per HPI above  Current Outpatient Prescriptions  Medication Sig Dispense Refill  . carvedilol (COREG) 25 MG tablet Take 0.5 tablets (12.5 mg total) by mouth 2 (two) times daily with a meal.    . clonazePAM (KLONOPIN) 0.5 MG tablet TAKE 1 TO 2 TABLETS BY MOUTH AS NEEDED FOR SLEEP (Patient taking differently: TAKE 1 TO 2 TABLETS BY MOUTH AT BEDTIME  AS NEEDED FOR SLEEP) 30  tablet 5  . colchicine 0.6 MG tablet Take 0.6 mg by mouth daily as needed (gout).     . furosemide (LASIX) 20 MG tablet Take 20 mg by mouth daily.    . Rivaroxaban (XARELTO) 20 MG TABS tablet Take 1 tablet (20 mg total) by mouth daily. 30 tablet 3  . simvastatin (ZOCOR) 40 MG tablet Take 40 mg by mouth every evening.    . valsartan (DIOVAN) 80 MG tablet Take 80 mg by mouth daily.     No current facility-administered medications for this visit.    Physical Exam: Filed Vitals:   08/27/14 1129  BP: 110/70  Pulse: 63  Height: 6\' 5"  (1.956 m)  Weight: 260 lb (117.935 kg)    GEN- The patient is well appearing, alert and oriented x 3 today.   Head- normocephalic, atraumatic Eyes-  Sclera clear, conjunctiva pink Ears- hearing intact Oropharynx- clear Lungs- Clear to ausculation bilaterally, normal work of breathing Chest- pacemaker pocket is well healed Heart- Regular rate and rhythm, no murmurs, rubs or gallops, PMI not laterally displaced GI- soft, NT, ND, + BS Extremities- no clubbing, cyanosis, or edema  Pacemaker interrogation- reviewed in detail today,  See PACEART report  Assessment and Plan:  1. Complete  heart block Normal pacemaker function See Pace Art report No changes today  2. Permanent afib Continue long term anticoagulation  3. OSA He is having difficulty sleeping and would like to consider additional sleep apnea management options Per his request, I will refer to Carlena Sax for further evaluation.  carelink Return to the EP clinic in 1 year

## 2014-08-28 ENCOUNTER — Telehealth: Payer: Self-pay | Admitting: Internal Medicine

## 2014-08-28 NOTE — Telephone Encounter (Signed)
08-28-14 pt has appt for sleep apnea with Dr. Nehemiah Settle [phone] on 09-29-14 at 900am/mt

## 2014-09-18 ENCOUNTER — Encounter (HOSPITAL_COMMUNITY): Payer: Self-pay | Admitting: Internal Medicine

## 2014-11-27 ENCOUNTER — Ambulatory Visit (INDEPENDENT_AMBULATORY_CARE_PROVIDER_SITE_OTHER): Payer: Medicare Other | Admitting: *Deleted

## 2014-11-27 ENCOUNTER — Telehealth: Payer: Self-pay | Admitting: Cardiology

## 2014-11-27 DIAGNOSIS — I442 Atrioventricular block, complete: Secondary | ICD-10-CM

## 2014-11-27 NOTE — Telephone Encounter (Signed)
Spoke with pt and reminded pt of remote transmission that is due today. Pt verbalized understanding.   

## 2014-11-27 NOTE — Progress Notes (Signed)
Remote pacemaker transmission.   

## 2014-11-29 LAB — MDC_IDC_ENUM_SESS_TYPE_REMOTE
Battery Voltage: 2.99 V
Brady Statistic AP VP Percent: 0 %
Brady Statistic AS VP Percent: 99.89 %
Brady Statistic AS VS Percent: 0.11 %
Brady Statistic RA Percent Paced: 0 %
Date Time Interrogation Session: 20160219004021
Lead Channel Impedance Value: 380 Ohm
Lead Channel Impedance Value: 513 Ohm
Lead Channel Impedance Value: 551 Ohm
Lead Channel Impedance Value: 608 Ohm
Lead Channel Impedance Value: 627 Ohm
Lead Channel Impedance Value: 779 Ohm
Lead Channel Pacing Threshold Amplitude: 0.375 V
Lead Channel Pacing Threshold Pulse Width: 0.4 ms
Lead Channel Sensing Intrinsic Amplitude: 0.75 mV
Lead Channel Setting Pacing Amplitude: 2 V
Lead Channel Setting Pacing Pulse Width: 0.4 ms
MDC IDC MSMT BATTERY REMAINING LONGEVITY: 51 mo
MDC IDC MSMT LEADCHNL LV IMPEDANCE VALUE: 475 Ohm
MDC IDC MSMT LEADCHNL LV IMPEDANCE VALUE: 703 Ohm
MDC IDC MSMT LEADCHNL RA IMPEDANCE VALUE: 418 Ohm
MDC IDC MSMT LEADCHNL RA SENSING INTR AMPL: 0.75 mV
MDC IDC SET LEADCHNL LV PACING AMPLITUDE: 2 V
MDC IDC SET LEADCHNL RV PACING PULSEWIDTH: 0.4 ms
MDC IDC SET LEADCHNL RV SENSING SENSITIVITY: 4 mV
MDC IDC SET ZONE DETECTION INTERVAL: 400 ms
MDC IDC STAT BRADY AP VS PERCENT: 0 %
MDC IDC STAT BRADY RV PERCENT PACED: 99.89 %
Zone Setting Detection Interval: 400 ms

## 2014-12-08 ENCOUNTER — Encounter: Payer: Self-pay | Admitting: Cardiology

## 2014-12-15 ENCOUNTER — Telehealth: Payer: Self-pay | Admitting: Internal Medicine

## 2014-12-15 ENCOUNTER — Encounter: Payer: Self-pay | Admitting: *Deleted

## 2014-12-15 ENCOUNTER — Encounter: Payer: Self-pay | Admitting: Internal Medicine

## 2014-12-15 NOTE — Telephone Encounter (Signed)
Last remote and last in office check printed out and given to [facility]. Routing to [employer]

## 2014-12-15 NOTE — Telephone Encounter (Signed)
Letter done and all faxed to [place]

## 2014-12-15 NOTE — Telephone Encounter (Signed)
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  Also, need copy of last pacemaker transmission for DOT.  Call and pt will pick up both.

## 2015-01-08 ENCOUNTER — Encounter: Payer: Self-pay | Admitting: *Deleted

## 2015-01-08 ENCOUNTER — Ambulatory Visit (INDEPENDENT_AMBULATORY_CARE_PROVIDER_SITE_OTHER): Payer: Medicare Other | Admitting: Cardiology

## 2015-01-08 ENCOUNTER — Encounter: Payer: Self-pay | Admitting: Cardiology

## 2015-01-08 VITALS — BP 118/78 | HR 65 | Ht 77.0 in | Wt 261.0 lb

## 2015-01-08 DIAGNOSIS — I482 Chronic atrial fibrillation, unspecified: Secondary | ICD-10-CM

## 2015-01-08 DIAGNOSIS — I5032 Chronic diastolic (congestive) heart failure: Secondary | ICD-10-CM

## 2015-01-08 LAB — BASIC METABOLIC PANEL
BUN: 18 mg/dL (ref 6–23)
CHLORIDE: 102 meq/L (ref 96–112)
CO2: 31 meq/L (ref 19–32)
Calcium: 9.8 mg/dL (ref 8.4–10.5)
Creatinine, Ser: 1.15 mg/dL (ref 0.40–1.50)
GFR: 67.32 mL/min (ref >60.00)
GLUCOSE: 108 mg/dL — AB (ref 70–99)
POTASSIUM: 3.9 meq/L (ref 3.5–5.1)
SODIUM: 138 meq/L (ref 135–145)

## 2015-01-08 LAB — LIPID PANEL
CHOL/HDL RATIO: 2
CHOLESTEROL: 120 mg/dL (ref 0–200)
HDL: 50.7 mg/dL (ref >39.00)
LDL Cholesterol: 58 mg/dL (ref 0–99)
NonHDL: 69.3
TRIGLYCERIDES: 55 mg/dL (ref 0.0–149.0)
VLDL: 11 mg/dL (ref 0.0–40.0)

## 2015-01-08 LAB — CBC WITH DIFFERENTIAL/PLATELET
BASOS ABS: 0 10*3/uL (ref 0.0–0.1)
Basophils Relative: 0.2 % (ref 0.0–3.0)
EOS ABS: 0.1 10*3/uL (ref 0.0–0.7)
Eosinophils Relative: 2.6 % (ref 0.0–5.0)
HEMATOCRIT: 42.3 % (ref 39.0–52.0)
Hemoglobin: 14.5 g/dL (ref 13.0–17.0)
LYMPHS ABS: 0.8 10*3/uL (ref 0.7–4.0)
Lymphocytes Relative: 13.1 % (ref 12.0–46.0)
MCHC: 34.3 g/dL (ref 30.0–36.0)
MCV: 97.3 fl (ref 78.0–100.0)
MONO ABS: 0.4 10*3/uL (ref 0.1–1.0)
Monocytes Relative: 6.1 % (ref 3.0–12.0)
NEUTROS ABS: 4.5 10*3/uL (ref 1.4–7.7)
Neutrophils Relative %: 78 % — ABNORMAL HIGH (ref 43.0–77.0)
Platelets: 254 10*3/uL (ref 150.0–400.0)
RBC: 4.35 Mil/uL (ref 4.22–5.81)
RDW: 13.1 % (ref 11.5–15.5)
WBC: 5.8 10*3/uL (ref 4.0–10.5)

## 2015-01-08 NOTE — Progress Notes (Signed)
Patient ID: [name], male   DOB: 1945-12-10, 69 y.o.   MRN: [id number] PCP: Dr. Brigitte Pulse  69 yo with history of chronic atrial fibrillation and nonischemic cardiomyopathy presents for cardiology followup.  On last echo in 6/14, LV EF remained improved at 60-65%.  He has had an AV nodal ablation and has a Medtronic CRT-P system in place (after generator change in 68/14, he had CRT-P instead of CRT-D).  He has OSA and uses CPAP.    [name] is doing well overall.  He is mildly short of breath climbing a flight of steps, otherwise no significant dyspnea.  No chest pain.  No orthopnea/PND.  He is on Xarelto with no bleeding problems.  Weight is down 3 lbs.  He goes to the gym and uses the treadmill, stationary bike, and light weights.    Labs (4/13): BNP 89, K 4.3, creatinine 1.0 Labs (5/13): K 3.9, creatinine 1.1 Labs (12/13): K 4, creatinine 1.3 Labs (7/14): K 4.1, creatinine 1.2, BNP 63 Labs (8/15): K 4.1, creatinine 1.1  ECG: Atrial fibrillation with BiV-pacing  PMH: 1. OSA on CPAP 2. Chronic atrial fibrillation: AV nodal ablation on 1/05.  3. Nonischemic cardiomyopathy: Normal cath in 2004.  Stress test in 2009 at [facility] normal.  Echo in 8/11 showed recovery of LV systolic function: EF 69-62%.  Echo in 4/13 with EF 50-55%, decreased RV systolic function, mild Thomas, PASP 32 mmHg.   Patient has a Medtronic CRT-P device.  Echo (6/14): EF 60-65%, mild RV enlargement with normal RV systolic function.  4. Dysphagia 5. Hyperlipidemia 6. HTN 7. GERD 8. Basal cell carcinoma s/p excision 9. Bilateral inguinal hernia repairs 10. Left TKR in 2007, redo right TKR in 11/10.   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    FH: Mother with Alzheimers  ROS: All systems reviewed and negative except as per HPI.   Current Outpatient Prescriptions  Medication Sig Dispense Refill  . carvedilol (COREG) 25 MG tablet Take 0.5 tablets (12.5 mg total) by  mouth 2 (two) times daily with a meal.    . clonazePAM (KLONOPIN) 0.5 MG tablet TAKE 1 TO 2 TABLETS BY MOUTH AS NEEDED FOR SLEEP 30 tablet 5  . colchicine 0.6 MG tablet Take 0.6 mg by mouth daily as needed (gout).     . furosemide (LASIX) 20 MG tablet Take 20 mg by mouth daily.    . Rivaroxaban (XARELTO) 20 MG TABS tablet Take 1 tablet (20 mg total) by mouth daily. 30 tablet 3  . simvastatin (ZOCOR) 40 MG tablet Take 40 mg by mouth every evening.    . valsartan (DIOVAN) 80 MG tablet Take 80 mg by mouth daily.     No current facility-administered medications for this visit.    BP 118/78 mmHg  Pulse 65  Ht 6\' 5"  (1.956 m)  Wt 261 lb (118.389 kg)  BMI 30.94 kg/m2 General: NAD Neck: No JVD, no thyromegaly or thyroid nodule.  Lungs: Clear to auscultation bilaterally with normal respiratory effort. CV: Nondisplaced PMI.  Heart regular S1/S2, no S3/S4, no murmur.  No peripheral edema.  No carotid bruit.  Normal pedal pulses.  Abdomen: Soft, nontender, no hepatosplenomegaly, no distention.  Neurologic: Alert and oriented x 3.  Psych: Normal affect. Extremities: No clubbing or cyanosis.   Assessment/Plan:  ATRIAL FIBRILLATION  Continue Xarelto.  Stable s/p AV nodal ablation and CRT.  CBC today.  Nonischemic cardiomyopathy NYHA class II symptoms, euvolemic  on exam.  EF was back to normal on 6/14 echo.  He now has a CRT-P system (had generator change recently, no longer has defibrillator function with normalization of EF). - Continue current doses of Coreg, Lasix, and valsartan  - BMET today. - I will arrange for echocardiogram in 8/16 (2 years post prior echo) to make sure EF remains preserved.  Hyperlipidemia On simvastatin, check lipids today.  OSA Has CPAP.  Followup in 6 months.  Loralie Champagne 01/08/2015

## 2015-01-08 NOTE — Patient Instructions (Signed)
Your physician recommends that you have  lab work today--Lipid profile/BMET/CBCd  Your physician has requested that you have an echocardiogram. Echocardiography is a painless test that uses sound waves to create images of your heart. It provides your doctor with information about the size and shape of your heart and how well your heart's chambers and valves are working. This procedure takes approximately one hour. There are no restrictions for this procedure. August 2016  Your physician wants you to follow-up in: 6 months with Dr Aundra Dubin.  You will receive a reminder letter in the mail two months in advance. If you don't receive a letter, please call our office to schedule the follow-up appointment.

## 2015-02-26 ENCOUNTER — Ambulatory Visit (INDEPENDENT_AMBULATORY_CARE_PROVIDER_SITE_OTHER): Payer: Medicare Other | Admitting: *Deleted

## 2015-02-26 DIAGNOSIS — I5032 Chronic diastolic (congestive) heart failure: Secondary | ICD-10-CM | POA: Diagnosis not present

## 2015-02-26 DIAGNOSIS — I442 Atrioventricular block, complete: Secondary | ICD-10-CM

## 2015-02-26 DIAGNOSIS — I428 Other cardiomyopathies: Secondary | ICD-10-CM

## 2015-02-26 DIAGNOSIS — I429 Cardiomyopathy, unspecified: Secondary | ICD-10-CM | POA: Diagnosis not present

## 2015-02-26 NOTE — Progress Notes (Signed)
Remote pacemaker transmission.   

## 2015-02-28 LAB — CUP PACEART REMOTE DEVICE CHECK
Battery Remaining Longevity: 46 mo
Brady Statistic AP VP Percent: 0 %
Brady Statistic AP VS Percent: 0 %
Brady Statistic AS VP Percent: 99.91 %
Brady Statistic AS VS Percent: 0.09 %
Brady Statistic RV Percent Paced: 99.91 %
Lead Channel Impedance Value: 418 Ohm
Lead Channel Impedance Value: 494 Ohm
Lead Channel Impedance Value: 513 Ohm
Lead Channel Impedance Value: 589 Ohm
Lead Channel Impedance Value: 703 Ohm
Lead Channel Impedance Value: 760 Ohm
Lead Channel Pacing Threshold Amplitude: 0.375 V
Lead Channel Sensing Intrinsic Amplitude: 0.875 mV
Lead Channel Setting Pacing Amplitude: 2 V
Lead Channel Setting Pacing Amplitude: 2 V
Lead Channel Setting Pacing Pulse Width: 0.4 ms
MDC IDC MSMT BATTERY VOLTAGE: 2.99 V
MDC IDC MSMT LEADCHNL LV IMPEDANCE VALUE: 380 Ohm
MDC IDC MSMT LEADCHNL LV IMPEDANCE VALUE: 589 Ohm
MDC IDC MSMT LEADCHNL RA SENSING INTR AMPL: 0.875 mV
MDC IDC MSMT LEADCHNL RV IMPEDANCE VALUE: 646 Ohm
MDC IDC MSMT LEADCHNL RV PACING THRESHOLD PULSEWIDTH: 0.4 ms
MDC IDC SESS DTM: 20160519184723
MDC IDC SET LEADCHNL LV PACING PULSEWIDTH: 0.4 ms
MDC IDC SET LEADCHNL RV SENSING SENSITIVITY: 4 mV
MDC IDC STAT BRADY RA PERCENT PACED: 0 %
Zone Setting Detection Interval: 400 ms
Zone Setting Detection Interval: 400 ms

## 2015-03-02 ENCOUNTER — Other Ambulatory Visit: Payer: Self-pay | Admitting: Dermatology

## 2015-03-13 ENCOUNTER — Ambulatory Visit: Payer: Medicare Other | Admitting: Internal Medicine

## 2015-03-18 ENCOUNTER — Encounter: Payer: Self-pay | Admitting: Cardiology

## 2015-03-25 ENCOUNTER — Encounter: Payer: Self-pay | Admitting: Internal Medicine

## 2015-05-12 ENCOUNTER — Other Ambulatory Visit (HOSPITAL_COMMUNITY): Payer: Medicare Other

## 2015-05-12 ENCOUNTER — Other Ambulatory Visit: Payer: Self-pay | Admitting: Cardiology

## 2015-05-12 ENCOUNTER — Other Ambulatory Visit: Payer: Self-pay

## 2015-05-12 ENCOUNTER — Ambulatory Visit (HOSPITAL_COMMUNITY): Payer: Medicare Other | Attending: Cardiology

## 2015-05-12 DIAGNOSIS — I482 Chronic atrial fibrillation, unspecified: Secondary | ICD-10-CM

## 2015-05-12 DIAGNOSIS — I5032 Chronic diastolic (congestive) heart failure: Secondary | ICD-10-CM

## 2015-05-12 DIAGNOSIS — I4891 Unspecified atrial fibrillation: Secondary | ICD-10-CM | POA: Diagnosis not present

## 2015-06-01 ENCOUNTER — Telehealth: Payer: Self-pay | Admitting: Cardiology

## 2015-06-01 ENCOUNTER — Ambulatory Visit (INDEPENDENT_AMBULATORY_CARE_PROVIDER_SITE_OTHER): Payer: Medicare Other | Admitting: *Deleted

## 2015-06-01 DIAGNOSIS — I428 Other cardiomyopathies: Secondary | ICD-10-CM

## 2015-06-01 DIAGNOSIS — I5032 Chronic diastolic (congestive) heart failure: Secondary | ICD-10-CM | POA: Diagnosis not present

## 2015-06-01 DIAGNOSIS — I429 Cardiomyopathy, unspecified: Secondary | ICD-10-CM

## 2015-06-01 NOTE — Telephone Encounter (Signed)
Spoke with pt and reminded pt of remote transmission that is due today. Pt verbalized understanding.   

## 2015-06-02 ENCOUNTER — Encounter: Payer: Self-pay | Admitting: Internal Medicine

## 2015-06-02 NOTE — Progress Notes (Signed)
Remote pacemaker transmission.   

## 2015-06-05 LAB — CUP PACEART REMOTE DEVICE CHECK
Brady Statistic AP VP Percent: 0 %
Brady Statistic AP VS Percent: 0 %
Brady Statistic AS VP Percent: 99.95 %
Brady Statistic AS VS Percent: 0.05 %
Brady Statistic RV Percent Paced: 99.95 %
Lead Channel Impedance Value: 361 Ohm
Lead Channel Impedance Value: 399 Ohm
Lead Channel Impedance Value: 475 Ohm
Lead Channel Impedance Value: 494 Ohm
Lead Channel Impedance Value: 608 Ohm
Lead Channel Impedance Value: 684 Ohm
Lead Channel Impedance Value: 741 Ohm
Lead Channel Setting Pacing Amplitude: 2 V
Lead Channel Setting Pacing Amplitude: 2 V
Lead Channel Setting Pacing Pulse Width: 0.4 ms
MDC IDC MSMT BATTERY REMAINING LONGEVITY: 45 mo
MDC IDC MSMT BATTERY VOLTAGE: 2.99 V
MDC IDC MSMT LEADCHNL LV IMPEDANCE VALUE: 589 Ohm
MDC IDC MSMT LEADCHNL RA SENSING INTR AMPL: 1.125 mV
MDC IDC MSMT LEADCHNL RA SENSING INTR AMPL: 1.125 mV
MDC IDC MSMT LEADCHNL RV IMPEDANCE VALUE: 551 Ohm
MDC IDC MSMT LEADCHNL RV PACING THRESHOLD AMPLITUDE: 0.375 V
MDC IDC MSMT LEADCHNL RV PACING THRESHOLD PULSEWIDTH: 0.4 ms
MDC IDC SESS DTM: 20160823163008
MDC IDC SET LEADCHNL RV PACING PULSEWIDTH: 0.4 ms
MDC IDC SET LEADCHNL RV SENSING SENSITIVITY: 4 mV
MDC IDC SET ZONE DETECTION INTERVAL: 400 ms
MDC IDC STAT BRADY RA PERCENT PACED: 0 %
Zone Setting Detection Interval: 400 ms

## 2015-06-12 ENCOUNTER — Encounter: Payer: Self-pay | Admitting: Cardiology

## 2015-09-16 NOTE — Progress Notes (Signed)
Electrophysiology Office Note Date: 09/17/2015  ID:  Thomas, Ibarra 10-Dec-1945, MRN [id number]  PCP: Marton Redwood, MD Primary Cardiologist: Aundra Dubin Electrophysiologist: Allred  CC: Pacemaker follow-up  [name] is a 69 y.o. male seen today for Dr Rayann Heman.  He presents today for routine electrophysiology followup.  Since last being seen in our clinic, the patient reports doing reasonably well.  He has been started on CPAP but has not noticed a significant improvement in fatigue.  He denies chest pain, palpitations, dyspnea, PND, orthopnea, nausea, vomiting, dizziness, syncope, edema, weight gain, or early satiety.  Echo 05/2015 demonstrated EF 50-55%, no RWMA, trivial AR, LA moderately dilated  Device History: MDT CRTD implanted 2007, gen change 2010, downgrade to MDT CRTP 2014    Past Medical History  Diagnosis Date  . Atrial fibrillation ([place])     permanent  . Hyperlipidemia   . Sleep apnea   . Arthritis   . GERD (gastroesophageal reflux disease)   . Chronic systolic dysfunction of left ventricle     s/p BiV ICD//downgrade to CRT-P (medtronic) on 04-25-2013 by Dr Rayann Heman  . CAD (coronary artery disease)   . Osteoarthritis   . Elevated LFTs   . IFG (impaired fasting glucose)   . Fatigue   . Dysphagia   . Complete heart block ([facility])     s/p AV nodal ablation for afib   Past Surgical History  Procedure Laterality Date  . Hernia repair    . Replacement total knee  2000, 2011 x2    bilateral  . Hydrocele excision    . Av node ablation    . Pacemaker insertion  2006  . Cardiac defibrillator placement  2007, 2010    MDT BiV ICD,  6949 RV lead, though pace/ sense is a Medtronic model 4076 - 58  lead; downgraded to CRTP on 04-25-2013 by Dr Rayann Heman  . Hand surgery      left  . Biv pacemaker generator change out  04-25-2013    downgrade of previously implanted CRTD to Medtronic CRTP by Dr Rayann Heman.  Previously implanted LV and 4076 RV leads were used  . Implantable  cardioverter defibrillator generator change N/A 04/25/2013    Procedure: IMPLANTABLE CARDIOVERTER DEFIBRILLATOR GENERATOR CHANGE;  Surgeon: Thompson Grayer, MD;  Location: [facility] CATH LAB;  Service: Cardiovascular;  Laterality: N/A;    Current Outpatient Prescriptions  Medication Sig Dispense Refill  . carvedilol (COREG) 25 MG tablet Take 0.5 tablets (12.5 mg total) by mouth 2 (two) times daily with a meal.    . clonazePAM (KLONOPIN) 0.5 MG tablet Take by mouth at bedtime as needed for anxiety (Take 1-2 tablets by mouth daily at bedtime as needed for sleeep).    . colchicine 0.6 MG tablet Take 0.6 mg by mouth daily as needed (gout).     . furosemide (LASIX) 20 MG tablet Take 20 mg by mouth daily.    . Rivaroxaban (XARELTO) 20 MG TABS tablet Take 1 tablet (20 mg total) by mouth daily. 30 tablet 3  . simvastatin (ZOCOR) 40 MG tablet Take 20 mg by mouth every evening.     . valsartan (DIOVAN) 80 MG tablet Take 80 mg by mouth daily.     No current facility-administered medications for this visit.    Allergies:   Lisinopril and Niacin   Social History: Social History   Social History  . Marital Status: Married    Spouse Name: N/A  . Number of Children: 2  . Years  of Education: N/A   Occupational History  . sales    Social History Main Topics  . Smoking status: Former Smoker -- 1.00 packs/day for 8 years    Types: Cigarettes    Quit date: 10/10/1997  . Smokeless tobacco: Never Used  . Alcohol Use: Yes     Comment: a 6 pack of beer a week, three glasses of wine a week  . Drug Use: No  . Sexual Activity: Yes   Other Topics Concern  . Not on file   Social History Narrative    Family History: Family History  Problem Relation Age of Onset  . Colon cancer Neg Hx   . Brain cancer Brother     step-brother  . Emphysema Father     smoker  . Arthritis Father   . Bone cancer Maternal Uncle   . Breast cancer Cousin     maternal cousin     Review of Systems: All other systems  reviewed and are otherwise negative except as noted above.   Physical Exam: VS:  BP 114/80 mmHg  Pulse 84  Ht 6\' 5"  (1.956 m)  Wt 265 lb 6.4 oz (120.385 kg)  BMI 31.47 kg/m2  SpO2 97% , BMI Body mass index is 31.47 kg/(m^2).  GEN- The patient is well appearing, alert and oriented x 3 today.   HEENT: normocephalic, atraumatic; sclera clear, conjunctiva pink; hearing intact; oropharynx clear; neck supple Lungs- Clear to ausculation bilaterally, normal work of breathing.  No wheezes, rales, rhonchi Heart- Regular rate and rhythm (paced) GI- soft, non-tender, non-distended, bowel sounds present Extremities- no clubbing, cyanosis, or edema; DP/PT/radial pulses 2+ bilaterally MS- no significant deformity or atrophy Skin- warm and dry, no rash or lesion; PPM pocket well healed Psych- euthymic mood, full affect Neuro- strength and sensation are intact  PPM Interrogation- reviewed in detail today,  See PACEART report  EKG:  EKG is not ordered today.  Recent Labs: 01/08/2015: BUN 18; Creatinine, Ser 1.15; Hemoglobin 14.5; Platelets 254.0; Potassium 3.9; Sodium 138   Wt Readings from Last 3 Encounters:  09/17/15 265 lb 6.4 oz (120.385 kg)  01/08/15 261 lb (118.389 kg)  08/27/14 260 lb (117.935 kg)     Other studies Reviewed: Additional studies/ records that were reviewed today include: Dr Rayann Heman and Dr Claris Gladden office notes  Assessment and Plan:  1.  Complete heart block s/p AVN ablation Normal CRTP function - pt is device dependent today See Pace Art report No changes today  2. Permanent atrial fibrillation Continue Xarelto for CHADS2VASC of 2  3.  OSA Compliant with CPAP  4.  NICM EF normalized post CRT Euvolemic on exam Continue current medications   Current medicines are reviewed at length with the patient today.   The patient does not have concerns regarding his medicines.  The following changes were made today:  none  Labs/ tests ordered today include:  none   Disposition:   Follow up with [facility] transmissions, Dr Rayann Heman in 1 year, Dr Aundra Dubin as scheduled   Signed, Chanetta Marshall, NP 09/17/2015 9:17 AM  [place] [address] [place] [address] [phone] (office) [phone] (fax)

## 2015-09-17 ENCOUNTER — Encounter: Payer: Self-pay | Admitting: Nurse Practitioner

## 2015-09-17 ENCOUNTER — Ambulatory Visit (INDEPENDENT_AMBULATORY_CARE_PROVIDER_SITE_OTHER): Payer: Medicare Other | Admitting: Nurse Practitioner

## 2015-09-17 VITALS — BP 114/80 | HR 84 | Ht 77.0 in | Wt 265.4 lb

## 2015-09-17 DIAGNOSIS — I429 Cardiomyopathy, unspecified: Secondary | ICD-10-CM | POA: Diagnosis not present

## 2015-09-17 DIAGNOSIS — G4733 Obstructive sleep apnea (adult) (pediatric): Secondary | ICD-10-CM | POA: Diagnosis not present

## 2015-09-17 DIAGNOSIS — I5032 Chronic diastolic (congestive) heart failure: Secondary | ICD-10-CM | POA: Diagnosis not present

## 2015-09-17 DIAGNOSIS — I442 Atrioventricular block, complete: Secondary | ICD-10-CM | POA: Diagnosis not present

## 2015-09-17 DIAGNOSIS — I428 Other cardiomyopathies: Secondary | ICD-10-CM

## 2015-09-17 DIAGNOSIS — I4821 Permanent atrial fibrillation: Secondary | ICD-10-CM

## 2015-09-17 DIAGNOSIS — I482 Chronic atrial fibrillation: Secondary | ICD-10-CM

## 2015-09-17 NOTE — Patient Instructions (Signed)
Medication Instructions:  Your physician recommends that you continue on your current medications as directed. Please refer to the Current Medication list given to you today.     If you need a refill on your cardiac medications before your next appointment, please call your pharmacy.  Labwork: NONE ORDER TODAY    Testing/Procedures: NONE ORDER TODAY    Follow-Up:  Your physician wants you to follow-up in: [place] will receive a reminder letter in the mail two months in advance. If you don't receive a letter, please call our office to schedule the follow-up appointment.     Any Other Special Instructions Will Be Listed Below (If Applicable).

## 2015-10-13 ENCOUNTER — Encounter: Payer: Self-pay | Admitting: Internal Medicine

## 2015-10-23 ENCOUNTER — Ambulatory Visit (INDEPENDENT_AMBULATORY_CARE_PROVIDER_SITE_OTHER): Payer: Medicare Other | Admitting: Cardiology

## 2015-10-23 ENCOUNTER — Encounter: Payer: Self-pay | Admitting: Cardiology

## 2015-10-23 VITALS — BP 118/78 | HR 75 | Ht 77.0 in | Wt 266.0 lb

## 2015-10-23 DIAGNOSIS — I481 Persistent atrial fibrillation: Secondary | ICD-10-CM | POA: Diagnosis not present

## 2015-10-23 DIAGNOSIS — I4891 Unspecified atrial fibrillation: Secondary | ICD-10-CM

## 2015-10-23 DIAGNOSIS — I5032 Chronic diastolic (congestive) heart failure: Secondary | ICD-10-CM | POA: Diagnosis not present

## 2015-10-23 DIAGNOSIS — I429 Cardiomyopathy, unspecified: Secondary | ICD-10-CM

## 2015-10-23 DIAGNOSIS — I428 Other cardiomyopathies: Secondary | ICD-10-CM

## 2015-10-23 DIAGNOSIS — I4819 Other persistent atrial fibrillation: Secondary | ICD-10-CM

## 2015-10-23 LAB — CBC WITH DIFFERENTIAL/PLATELET
BASOS PCT: 0 % (ref 0–1)
Basophils Absolute: 0 10*3/uL (ref 0.0–0.1)
EOS ABS: 0.3 10*3/uL (ref 0.0–0.7)
EOS PCT: 3 % (ref 0–5)
HCT: 40.3 % (ref 39.0–52.0)
Hemoglobin: 13.8 g/dL (ref 13.0–17.0)
Lymphocytes Relative: 13 % (ref 12–46)
Lymphs Abs: 1.1 10*3/uL (ref 0.7–4.0)
MCH: 33.2 pg (ref 26.0–34.0)
MCHC: 34.2 g/dL (ref 30.0–36.0)
MCV: 96.9 fL (ref 78.0–100.0)
MONO ABS: 0.7 10*3/uL (ref 0.1–1.0)
MPV: 9.7 fL (ref 8.6–12.4)
Monocytes Relative: 8 % (ref 3–12)
NEUTROS ABS: 6.4 10*3/uL (ref 1.7–7.7)
Neutrophils Relative %: 76 % (ref 43–77)
PLATELETS: 241 10*3/uL (ref 150–400)
RBC: 4.16 MIL/uL — AB (ref 4.22–5.81)
RDW: 13.5 % (ref 11.5–15.5)
WBC: 8.4 10*3/uL (ref 4.0–10.5)

## 2015-10-23 LAB — BASIC METABOLIC PANEL
BUN: 16 mg/dL (ref 7–25)
CALCIUM: 9.8 mg/dL (ref 8.6–10.3)
CO2: 26 mmol/L (ref 20–31)
Chloride: 103 mmol/L (ref 98–110)
Creat: 1.15 mg/dL (ref 0.70–1.25)
GLUCOSE: 67 mg/dL (ref 65–99)
Potassium: 4 mmol/L (ref 3.5–5.3)
SODIUM: 139 mmol/L (ref 135–146)

## 2015-10-23 MED ORDER — COLCHICINE 0.6 MG PO TABS: 0.6000 mg | ORAL_TABLET | Freq: Every day | ORAL | Status: DC | PRN

## 2015-10-23 NOTE — Patient Instructions (Signed)
Medication Instructions:  Dr Aundra Dubin refilled your colchicine 0.6mg  to take daily as needed when you have a gout flare.  Labwork: BMET/CBCd today  Testing/Procedures: None   Follow-Up: Your physician wants you to follow-up in: 6 months with Dr Aundra Dubin. (July 2017). You will receive a reminder letter in the mail two months in advance. If you don't receive a letter, please call our office to schedule the follow-up appointment.        If you need a refill on your cardiac medications before your next appointment, please call your pharmacy.

## 2015-10-24 NOTE — Progress Notes (Signed)
Patient ID: [name], male   DOB: [birth year], 70 y.o.   MRN: [id number] PCP: Dr. Brigitte Pulse  70 yo with history of chronic atrial fibrillation and nonischemic cardiomyopathy presents for cardiology followup.  On last echo in 8/16, LV EF remained improved at 50-55%.  He has had an AV nodal ablation and has a Medtronic CRT-P system in place (after generator change in 7/14, he had CRT-P instead of CRT-D).  He has OSA and uses CPAP.  [name] is doing well overall.  He is mildly short of breath climbing a flight of steps or walking up a hill, otherwise no significant dyspnea.  No chest pain.  No orthopnea/PND.  He is on Xarelto with no bleeding problems.  He has an ongoing gout flare in 1st left MTP.  Labs (4/13): BNP 89, K 4.3, creatinine 1.0 Labs (5/13): K 3.9, creatinine 1.1 Labs (12/13): K 4, creatinine 1.3 Labs (7/14): K 4.1, creatinine 1.2, BNP 63 Labs (8/15): K 4.1, creatinine 1.1 Labs (3/16): K 3.9, creatinine 1.15, LDL 58, HDL 51  ECG: Atrial fibrillation with BiV-pacing  PMH: 1. OSA on CPAP 2. Chronic atrial fibrillation: AV nodal ablation on 1/05.  3. Nonischemic cardiomyopathy: Normal cath in 2004.  Stress test in 2009 at [facility] normal.  Echo in 8/11 showed recovery of LV systolic function: EF 99991111.  Echo in 4/13 with EF 50-55%, decreased RV systolic function, mild MR, PASP 32 mmHg.   Patient has a Medtronic CRT-P device.  Echo (6/14): EF 60-65%, mild RV enlargement with normal RV systolic function.  Echo (8/16) with EF 50-55%, moderate LAE.  4. Dysphagia 5. Hyperlipidemia 6. HTN 7. GERD 8. Basal cell carcinoma s/p excision 9. Bilateral inguinal hernia repairs 10. Left TKR in 2007, redo right TKR in 11/10.  11. 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    FH: Mother with Alzheimers  ROS: All systems reviewed and negative except as per HPI.   Current Outpatient Prescriptions  Medication Sig Dispense Refill  .  carvedilol (COREG) 25 MG tablet Take 0.5 tablets (12.5 mg total) by mouth 2 (two) times daily with a meal.    . clonazePAM (KLONOPIN) 0.5 MG tablet Take by mouth at bedtime as needed for anxiety (Take 1-2 tablets by mouth daily at bedtime as needed for sleeep).    . colchicine 0.6 MG tablet Take 1 tablet (0.6 mg total) by mouth daily as needed (gout). 30 tablet 3  . furosemide (LASIX) 20 MG tablet Take 20 mg by mouth daily.    . Rivaroxaban (XARELTO) 20 MG TABS tablet Take 1 tablet (20 mg total) by mouth daily. 30 tablet 3  . simvastatin (ZOCOR) 40 MG tablet Take 20 mg by mouth every evening.     . valsartan (DIOVAN) 80 MG tablet Take 80 mg by mouth daily.     No current facility-administered medications for this visit.    BP 118/78 mmHg  Pulse 75  Ht 6\' 5"  (1.956 m)  Wt 266 lb (120.657 kg)  BMI 31.54 kg/m2 General: NAD Neck: No JVD, no thyromegaly or thyroid nodule.  Lungs: Clear to auscultation bilaterally with normal respiratory effort. CV: Nondisplaced PMI.  Heart regular S1/S2, no S3/S4, no murmur.  No peripheral edema.  No carotid bruit.  Normal pedal pulses.  Abdomen: Soft, nontender, no hepatosplenomegaly, no distention.  Neurologic: Alert and oriented x 3.  Psych: Normal affect. Extremities: No clubbing or cyanosis.   Assessment/Plan:  ATRIAL FIBRILLATION  Continue Xarelto.  Stable s/p AV nodal ablation and CRT.  CBC today.  Nonischemic cardiomyopathy NYHA class II symptoms, euvolemic on exam.  EF 50-55% on 8/16 echo.  He now has a CRT-P system (had generator change recently, no longer has defibrillator function with near-normalization of EF). - Continue current doses of Coreg, Lasix, and valsartan  - BMET today. Hyperlipidemia Good lipids in 3/16.   OSA Has CPAP. Gout Acute flare left 1st MTP.  I will give him colchicine.   Followup in 6 months.  Loralie Champagne 10/24/2015

## 2015-12-17 ENCOUNTER — Ambulatory Visit (INDEPENDENT_AMBULATORY_CARE_PROVIDER_SITE_OTHER): Payer: Medicare Other | Admitting: *Deleted

## 2015-12-17 DIAGNOSIS — I442 Atrioventricular block, complete: Secondary | ICD-10-CM

## 2015-12-17 DIAGNOSIS — I428 Other cardiomyopathies: Secondary | ICD-10-CM

## 2015-12-17 DIAGNOSIS — I429 Cardiomyopathy, unspecified: Secondary | ICD-10-CM | POA: Diagnosis not present

## 2015-12-18 NOTE — Progress Notes (Signed)
Remote pacemaker transmission.   

## 2015-12-19 NOTE — Progress Notes (Signed)
Normal remote reviewed. Optivol elevated, will enroll in [facility] clinic   Next Carelink 03/17/16

## 2015-12-25 ENCOUNTER — Encounter: Payer: Self-pay | Admitting: Internal Medicine

## 2015-12-30 ENCOUNTER — Telehealth: Payer: Self-pay

## 2015-12-30 NOTE — Telephone Encounter (Signed)
Patient referred to [facility] clinic by Chanetta Marshall, NP.  Spoke with patient and explained ICM program and patient declined.  He stated he did not think he needed monthly monitoring and was unsure how much copay it would be.  Explained that monitoring it normally covered and if any copay it is usually very small amount.  Explained remote transmissions are done every 3 months and asked if he has a copay with that monitoring and he stated he was unsure.   Advised if he changes his mind he can always be followed at a later date.  No enrollment today.

## 2016-02-11 ENCOUNTER — Encounter: Payer: Self-pay | Admitting: Internal Medicine

## 2016-02-16 ENCOUNTER — Encounter: Payer: Self-pay | Admitting: Cardiology

## 2016-02-19 ENCOUNTER — Telehealth: Payer: Self-pay | Admitting: Cardiology

## 2016-02-19 NOTE — Telephone Encounter (Signed)
I spoke with the pt and made him aware of lab results from [facility].

## 2016-02-19 NOTE — Telephone Encounter (Signed)
New message ° ° °Patient returning call back to nurse  - lab results   °

## 2016-03-16 LAB — CUP PACEART REMOTE DEVICE CHECK
Brady Statistic AS VP Percent: 99.97 %
Brady Statistic RA Percent Paced: 0 %
Implantable Lead Implant Date: 20061130
Implantable Lead Implant Date: 20061130
Implantable Lead Location: 753860
Implantable Lead Model: 4194
Implantable Lead Model: 5076
Lead Channel Impedance Value: 399 Ohm
Lead Channel Pacing Threshold Amplitude: 0.375 V
Lead Channel Sensing Intrinsic Amplitude: 1.125 mV
Lead Channel Setting Pacing Amplitude: 2 V
Lead Channel Setting Pacing Pulse Width: 0.4 ms
Lead Channel Setting Pacing Pulse Width: 0.4 ms
Lead Channel Setting Sensing Sensitivity: 4 mV
MDC IDC LEAD IMPLANT DT: 20061130
MDC IDC LEAD IMPLANT DT: 20061130
MDC IDC LEAD LOCATION: 753858
MDC IDC LEAD LOCATION: 753859
MDC IDC LEAD LOCATION: 753860
MDC IDC LEAD MODEL: 6949
MDC IDC MSMT BATTERY REMAINING LONGEVITY: 40 mo
MDC IDC MSMT BATTERY VOLTAGE: 2.99 V
MDC IDC MSMT LEADCHNL LV IMPEDANCE VALUE: 513 Ohm
MDC IDC MSMT LEADCHNL LV IMPEDANCE VALUE: 589 Ohm
MDC IDC MSMT LEADCHNL LV IMPEDANCE VALUE: 703 Ohm
MDC IDC MSMT LEADCHNL LV IMPEDANCE VALUE: 760 Ohm
MDC IDC MSMT LEADCHNL RA IMPEDANCE VALUE: 418 Ohm
MDC IDC MSMT LEADCHNL RA IMPEDANCE VALUE: 532 Ohm
MDC IDC MSMT LEADCHNL RA SENSING INTR AMPL: 1.125 mV
MDC IDC MSMT LEADCHNL RV IMPEDANCE VALUE: 608 Ohm
MDC IDC MSMT LEADCHNL RV IMPEDANCE VALUE: 665 Ohm
MDC IDC MSMT LEADCHNL RV PACING THRESHOLD PULSEWIDTH: 0.4 ms
MDC IDC SESS DTM: 20170308100443
MDC IDC SET LEADCHNL RV PACING AMPLITUDE: 2 V
MDC IDC STAT BRADY AP VP PERCENT: 0 %
MDC IDC STAT BRADY AP VS PERCENT: 0 %
MDC IDC STAT BRADY AS VS PERCENT: 0.03 %
MDC IDC STAT BRADY RV PERCENT PACED: 99.97 %

## 2016-03-16 LAB — CUP PACEART INCLINIC DEVICE CHECK
Battery Voltage: 2.99 V
Date Time Interrogation Session: 20170607193339
Implantable Lead Location: 753860
Implantable Lead Location: 753860
Implantable Lead Model: 4194
Implantable Lead Model: 5076
Lead Channel Impedance Value: 741 Ohm
Lead Channel Pacing Threshold Amplitude: 1 V
Lead Channel Pacing Threshold Pulse Width: 0.4 ms
Lead Channel Setting Pacing Amplitude: 2 V
Lead Channel Setting Pacing Pulse Width: 0.4 ms
Lead Channel Setting Sensing Sensitivity: 4 mV
MDC IDC LEAD IMPLANT DT: 20061130
MDC IDC LEAD IMPLANT DT: 20061130
MDC IDC LEAD IMPLANT DT: 20061130
MDC IDC LEAD IMPLANT DT: 20061130
MDC IDC LEAD LOCATION: 753858
MDC IDC LEAD LOCATION: 753859
MDC IDC LEAD MODEL: 6949
MDC IDC MSMT LEADCHNL LV PACING THRESHOLD PULSEWIDTH: 0.4 ms
MDC IDC MSMT LEADCHNL RV IMPEDANCE VALUE: 627 Ohm
MDC IDC MSMT LEADCHNL RV PACING THRESHOLD AMPLITUDE: 0.75 V
MDC IDC SET LEADCHNL LV PACING AMPLITUDE: 2 V
MDC IDC SET LEADCHNL LV PACING PULSEWIDTH: 0.4 ms
MDC IDC STAT BRADY AS VP PERCENT: 99.9 %

## 2016-03-17 ENCOUNTER — Ambulatory Visit (INDEPENDENT_AMBULATORY_CARE_PROVIDER_SITE_OTHER): Payer: Medicare Other | Admitting: *Deleted

## 2016-03-17 DIAGNOSIS — I442 Atrioventricular block, complete: Secondary | ICD-10-CM

## 2016-03-18 NOTE — Progress Notes (Signed)
Remote pacemaker transmission.   

## 2016-03-25 LAB — CUP PACEART REMOTE DEVICE CHECK
Battery Remaining Longevity: 41 mo
Brady Statistic AP VP Percent: 0 %
Brady Statistic AP VS Percent: 0 %
Brady Statistic AS VP Percent: 99.95 %
Brady Statistic AS VS Percent: 0.05 %
Implantable Lead Implant Date: 20061130
Implantable Lead Location: 753860
Implantable Lead Location: 753860
Implantable Lead Model: 4076
Implantable Lead Model: 6949
Lead Channel Impedance Value: 380 Ohm
Lead Channel Impedance Value: 380 Ohm
Lead Channel Impedance Value: 475 Ohm
Lead Channel Impedance Value: 475 Ohm
Lead Channel Impedance Value: 589 Ohm
Lead Channel Impedance Value: 703 Ohm
Lead Channel Pacing Threshold Pulse Width: 0.4 ms
Lead Channel Sensing Intrinsic Amplitude: 1 mV
Lead Channel Setting Sensing Sensitivity: 4 mV
MDC IDC LEAD IMPLANT DT: 20061130
MDC IDC LEAD IMPLANT DT: 20061130
MDC IDC LEAD IMPLANT DT: 20061130
MDC IDC LEAD LOCATION: 753858
MDC IDC LEAD LOCATION: 753859
MDC IDC MSMT BATTERY VOLTAGE: 2.99 V
MDC IDC MSMT LEADCHNL LV IMPEDANCE VALUE: 589 Ohm
MDC IDC MSMT LEADCHNL LV IMPEDANCE VALUE: 760 Ohm
MDC IDC MSMT LEADCHNL RA SENSING INTR AMPL: 1 mV
MDC IDC MSMT LEADCHNL RV IMPEDANCE VALUE: 532 Ohm
MDC IDC MSMT LEADCHNL RV PACING THRESHOLD AMPLITUDE: 0.375 V
MDC IDC SESS DTM: 20170609174927
MDC IDC SET LEADCHNL LV PACING AMPLITUDE: 2 V
MDC IDC SET LEADCHNL LV PACING PULSEWIDTH: 0.4 ms
MDC IDC SET LEADCHNL RV PACING AMPLITUDE: 2 V
MDC IDC SET LEADCHNL RV PACING PULSEWIDTH: 0.4 ms
MDC IDC STAT BRADY RA PERCENT PACED: 0 %
MDC IDC STAT BRADY RV PERCENT PACED: 99.95 %

## 2016-03-30 ENCOUNTER — Encounter: Payer: Self-pay | Admitting: Cardiology

## 2016-04-18 ENCOUNTER — Encounter: Payer: Self-pay | Admitting: Internal Medicine

## 2016-04-18 ENCOUNTER — Ambulatory Visit (INDEPENDENT_AMBULATORY_CARE_PROVIDER_SITE_OTHER): Payer: Medicare Other | Admitting: Internal Medicine

## 2016-04-18 VITALS — BP 110/76 | HR 68 | Ht 77.0 in | Wt 258.2 lb

## 2016-04-18 DIAGNOSIS — Z7901 Long term (current) use of anticoagulants: Secondary | ICD-10-CM | POA: Diagnosis not present

## 2016-04-18 DIAGNOSIS — R1310 Dysphagia, unspecified: Secondary | ICD-10-CM

## 2016-04-18 DIAGNOSIS — Z86010 Personal history of colonic polyps: Secondary | ICD-10-CM

## 2016-04-18 DIAGNOSIS — K219 Gastro-esophageal reflux disease without esophagitis: Secondary | ICD-10-CM | POA: Diagnosis not present

## 2016-04-18 MED ORDER — NA SULFATE-K SULFATE-MG SULF 17.5-3.13-1.6 GM/177ML PO SOLN: 1.0000 | Freq: Once | ORAL | Status: DC

## 2016-04-18 NOTE — Telephone Encounter (Signed)
  04/18/2016   RE: LINDA SEPER DOB: [birth year] MRN: [id number]   Dear Dr. Rayann Heman,    We have scheduled the above patient for an endoscopic procedure. Our records show that he is on anticoagulation therapy.   Please advise as to how long the patient may come off his therapy of Xarelto prior to the procedure, which is scheduled for 05/06/2016.  Please fax back/ or route the completed form to [place] at [phone].   Sincerely,    Phillis Haggis

## 2016-04-18 NOTE — Patient Instructions (Signed)
You have been scheduled for an endoscopy and colonoscopy. Please follow the written instructions given to you at your visit today. Please pick up your prep supplies at the pharmacy within the next 1-3 days. If you use inhalers (even only as needed), please bring them with you on the day of your procedure.  

## 2016-04-18 NOTE — Progress Notes (Signed)
HISTORY OF PRESENT ILLNESS:  [name] is a 70 y.o. male who is referred by his primary care provider, Dr. Brigitte Pulse, with a chief complaint of dysphagia. He has a history of congestive heart failure (ejection fraction 50-55%) with implantable defibrillator, atrial fibrillation status post pacemaker on Xarelto, and sleep apnea. Last seen here April 2012 when he underwent surveillance colonoscopy for history of tubular adenomas. Examination was normal. Follow-up in 5 years recommended. Patient denies lower GI complaints. No prior upper endoscopy. He reports [place] solid food dysphagia for years. This has worsened over time. He describes intermittent transient food impaction which either resolve spontaneously or require self-induced emesis for relief. He does have heartburn symptoms at least twice per week. Not on PPI. No other complaints  REVIEW OF SYSTEMS:  All non-GI ROS negative except for arthritis  Past Medical History  Diagnosis Date  . Atrial fibrillation ([place])     permanent  . Hyperlipidemia   . Sleep apnea   . Arthritis   . GERD (gastroesophageal reflux disease)   . Chronic systolic dysfunction of left ventricle     s/p BiV ICD//downgrade to CRT-P (medtronic) on 04-25-2013 by Dr Rayann Heman  . CAD (coronary artery disease)   . Osteoarthritis   . Elevated LFTs   . IFG (impaired fasting glucose)   . Fatigue   . Dysphagia   . Complete heart block ([facility])     s/p AV nodal ablation for afib  . Hemochromatosis   . Colon polyps   . AAA (abdominal aortic aneurysm) ([place])   . Gout     Past Surgical History  Procedure Laterality Date  . Hernia repair    . Replacement total knee  2000, 2011 x2    bilateral  . Hydrocele excision    . Av node ablation    . Pacemaker insertion  2006  . Cardiac defibrillator placement  2007, 2010    MDT BiV ICD,  6949 RV lead, though pace/ sense is a Medtronic model 4076 - 58  lead; downgraded to CRTP on 04-25-2013 by Dr Rayann Heman  . Hand surgery      left   . Biv pacemaker generator change out  04-25-2013    downgrade of previously implanted CRTD to Medtronic CRTP by Dr Rayann Heman.  Previously implanted LV and 4076 RV leads were used  . Implantable cardioverter defibrillator generator change N/A 04/25/2013    Procedure: IMPLANTABLE CARDIOVERTER DEFIBRILLATOR GENERATOR CHANGE;  Surgeon: Thompson Grayer, MD;  Location: [facility] CATH LAB;  Service: Cardiovascular;  Laterality: N/A;    Social History [name]  reports that he quit smoking about 18 years ago. His smoking use included Cigarettes. He has a 8 pack-year smoking history. He has never used smokeless tobacco. He reports that he drinks alcohol. He reports that he does not use illicit drugs.  family history includes Arthritis in his father; Bone cancer in his maternal uncle; Brain cancer in his brother; Breast cancer in his cousin; Emphysema in his father. There is no history of Colon cancer.  Allergies  Allergen Reactions  . Lisinopril     Hot flashes  . Niacin     Hot flashes       PHYSICAL EXAMINATION: Vital signs: BP 110/76 mmHg  Pulse 68  Ht 6\' 5"  (1.956 m)  Wt 258 lb 3.2 oz (117.119 kg)  BMI 30.61 kg/m2  Constitutional: generally well-appearing, no acute distress Psychiatric: alert and oriented x3, cooperative Eyes: extraocular movements intact, anicteric, conjunctiva pink Mouth: oral pharynx  moist, no lesions Neck: supple Without thyromegaly Lymph: no lymphadenopathy Cardiovascular: heart regular rate and rhythm, no murmur Lungs: clear to auscultation bilaterally Abdomen: soft, nontender, nondistended, no obvious ascites, no peritoneal signs, normal bowel sounds, no organomegaly Rectal: Deferred until colonoscopy Extremities: no clubbing, cyanosis, or lower extremity edema bilaterally Skin: no lesions on visible extremities Neuro: No focal deficits. Cranial nerves intact  ASSESSMENT:  #1. Intermittent solid food dysphagia with transient food impaction. Most likely peptic  stricture #2. GERD. Symptoms several times per week. Untreated #3. History of adenomatous colon polyps. Due for surveillance #4. Multiple medical problems as described. On chronic anticoagulation   PLAN:  #1. Reflux precautions #2. Will likely need PPI. Discussed #3. Schedule upper endoscopy with esophageal dilation. The patient is high risk in his comorbidities and the need to address anticoagulation.The nature of the procedure, as well as the risks, benefits, and alternatives were carefully and thoroughly reviewed with the patient. Ample time for discussion and questions allowed. The patient understood, was satisfied, and agreed to proceed. #4. Schedule surveillance colonoscopy. The patient is high-risk given his comorbidities and the need to address anticoagulation.The nature of the procedure, as well as the risks, benefits, and alternatives were carefully and thoroughly reviewed with the patient. Ample time for discussion and questions allowed. The patient understood, was satisfied, and agreed to proceed. #5. Hold Xarelto 3 days prior to the procedure. We will check with his cardiologist Dr. Thompson Grayer to see if this is acceptable. We will resume anticoagulation therapy, most likely, immediately post procedure  A copy of this consultation note has been sent to Dr. Brigitte Pulse and Dr. Rayann Heman

## 2016-04-18 NOTE — Telephone Encounter (Signed)
Pt takes Xarelto for afib with CHADS2 score of 2 (HTN, CHF). Ok to hold Xarelto up to 2 days prior to procedure. Clearance routed to [facility].

## 2016-04-28 ENCOUNTER — Telehealth: Payer: Self-pay | Admitting: Internal Medicine

## 2016-04-28 NOTE — Telephone Encounter (Signed)
   RE: [name] DOB: [birth year] MRN: [id number]   Dear Rayann Heman,    We have scheduled the above patient for an endoscopic procedure. Our records show that he is on anticoagulation therapy.   Please advise as to how long the patient may come off his therapy of Xarelto prior to the procedure, which is scheduled for 05/06/2016.  Please fax back/ or route the completed form to [place] at [phone].   Sincerely,

## 2016-04-29 ENCOUNTER — Telehealth: Payer: Self-pay

## 2016-04-29 NOTE — Telephone Encounter (Signed)
Spoke with [employer] to make sure Dr. Rayann Heman received the anti-coagulation letter I routed.   The person to whom I spoke is going to send his staff a reminder that he needs to answer quickly so patient can hold his Xarelto before procedure.

## 2016-04-29 NOTE — Telephone Encounter (Signed)
Follow-up     The office was calling and making sure the doctor knows to fill out the letter and fax the letter back the pt is having their procedure on July 28th

## 2016-05-01 NOTE — Telephone Encounter (Signed)
Will forward to anticoagulation clinic for management per protocol 

## 2016-05-02 NOTE — Telephone Encounter (Signed)
Ok to hold Xarelto x2 days prior to procedure.

## 2016-05-06 ENCOUNTER — Ambulatory Visit (AMBULATORY_SURGERY_CENTER): Payer: Medicare Other | Admitting: Internal Medicine

## 2016-05-06 ENCOUNTER — Encounter: Payer: Self-pay | Admitting: Internal Medicine

## 2016-05-06 VITALS — BP 111/68 | HR 60 | Temp 97.8°F | Resp 10 | Ht 77.0 in | Wt 258.0 lb

## 2016-05-06 DIAGNOSIS — Z86010 Personal history of colonic polyps: Secondary | ICD-10-CM

## 2016-05-06 DIAGNOSIS — K222 Esophageal obstruction: Secondary | ICD-10-CM | POA: Diagnosis not present

## 2016-05-06 DIAGNOSIS — K219 Gastro-esophageal reflux disease without esophagitis: Secondary | ICD-10-CM

## 2016-05-06 DIAGNOSIS — R1310 Dysphagia, unspecified: Secondary | ICD-10-CM | POA: Diagnosis not present

## 2016-05-06 DIAGNOSIS — D122 Benign neoplasm of ascending colon: Secondary | ICD-10-CM

## 2016-05-06 DIAGNOSIS — Z1211 Encounter for screening for malignant neoplasm of colon: Secondary | ICD-10-CM

## 2016-05-06 DIAGNOSIS — K635 Polyp of colon: Secondary | ICD-10-CM

## 2016-05-06 MED ORDER — SODIUM CHLORIDE 0.9 % IV SOLN: 500.0000 mL | INTRAVENOUS | Status: AC

## 2016-05-06 MED ORDER — OMEPRAZOLE 20 MG PO CPDR: 20.0000 mg | DELAYED_RELEASE_CAPSULE | Freq: Every day | ORAL | 11 refills | Status: DC

## 2016-05-06 NOTE — Op Note (Signed)
[place] Patient Name: [name] Procedure Date: 05/06/2016 3:01 PM MRN: [id number] Endoscopist: Docia Chuck. Henrene Pastor , MD Age: 70 Referring MD:  Date of Birth: 02/22/1946 Gender: Male Account #: [id number] Procedure:                Colonoscopy, with snare polypectomy x 1 Indications:              Surveillance: Personal history of adenomatous                            polyps on last colonoscopy 5 years ago, High risk                            colon cancer surveillance: Personal history of                            non-advanced adenoma. Previous examinations 2007                            and 2012 Medicines:                Monitored Anesthesia Care Procedure:                Pre-Anesthesia Assessment:                           - Prior to the procedure, a History and Physical                            was performed, and patient medications and                            allergies were reviewed. The patient's tolerance of                            previous anesthesia was also reviewed. The risks                            and benefits of the procedure and the sedation                            options and risks were discussed with the patient.                            All questions were answered, and informed consent                            was obtained. Prior Anticoagulants: The patient has                            taken Xarelto (rivaroxaban), last dose was 3 days                            prior to procedure. ASA Grade Assessment: III - A  patient with severe systemic disease. After                            reviewing the risks and benefits, the patient was                            deemed in satisfactory condition to undergo the                            procedure.                           After obtaining informed consent, the colonoscope                            was passed under direct vision. Throughout the         procedure, the patient's blood pressure, pulse, and                            oxygen saturations were monitored continuously. The                            [id number]) was introduced through the anus                            and advanced to the the cecum, identified by                            appendiceal orifice and ileocecal valve. The                            ileocecal valve, appendiceal orifice, and rectum                            were photographed. The quality of the bowel                            preparation was excellent. The colonoscopy was                            performed without difficulty. The patient tolerated                            the procedure well. The bowel preparation used was                            SUPREP. Scope In: 3:14:33 PM Scope Out: 3:28:25 PM Scope Withdrawal Time: 0 hours 10 minutes 31 seconds  Total Procedure Duration: 0 hours 13 minutes 52 seconds  Findings:                 A 1 mm polyp was found in the ascending colon. The                            polyp was  removed with a cold snare. Resection and                            retrieval were complete.                           The exam was otherwise without abnormality on                            direct and retroflexion views. Complications:            No immediate complications. Estimated blood loss:                            None. Estimated Blood Loss:     Estimated blood loss: none. Impression:               - One 1 mm polyp in the ascending colon, removed                            with a cold snare. Resected and retrieved.                           - The examination was otherwise normal on direct                            and retroflexion views. Recommendation:           - Repeat colonoscopy in 5 years for surveillance.                           - Resume Xarelto (rivaroxaban) today at prior dose.                           - Please see EGD report.                            - Await pathology results. Docia Chuck. Henrene Pastor, MD 05/06/2016 3:36:27 PM This report has been signed electronically.

## 2016-05-06 NOTE — Op Note (Signed)
[facility] Patient Name: [name] Procedure Date: 05/06/2016 3:30 PM MRN: [id number] Endoscopist: Docia Chuck. Henrene Pastor , MD Age: 70 Referring MD:  Date of Birth: 1946-09-07 Gender: Male Account #: [id number] Procedure:                Upper GI endoscopy, with Venia Minks dilation-54 F Indications:              Dysphagia Medicines:                Monitored Anesthesia Care Procedure:                Pre-Anesthesia Assessment:                           - Prior to the procedure, a History and Physical                            was performed, and patient medications and                            allergies were reviewed. The patient's tolerance of                            previous anesthesia was also reviewed. The risks                            and benefits of the procedure and the sedation                            options and risks were discussed with the patient.                            All questions were answered, and informed consent                            was obtained. Prior Anticoagulants: The patient has                            taken Xarelto (rivaroxaban), last dose was 3 days                            prior to procedure. ASA Grade Assessment: III - A                            patient with severe systemic disease. After                            reviewing the risks and benefits, the patient was                            deemed in satisfactory condition to undergo the                            procedure.  After obtaining informed consent, the endoscope was                            passed under direct vision. Throughout the                            procedure, the patient's blood pressure, pulse, and                            oxygen saturations were monitored continuously. The                            Model [id number]-[id number]) scope was introduced                            through the mouth, and advanced to the second part                            of duodenum. The upper GI endoscopy was                            accomplished without difficulty. The patient                            tolerated the procedure well. Scope In: Scope Out: Findings:                 One mild benign-appearing, intrinsic stenosis was                            found. This measured 1.5 cm (inner diameter). There                            was associated mild esophagitis.. The scope was                            withdrawn after the endoscopic survey. Dilation was                            performed with a Maloney dilator with no resistance                            at 19 Fr.                           The exam of the esophagus was otherwise normal.                           The stomach was normal.                           The examined duodenum was normal.                           The cardia and gastric fundus were normal on  retroflexion. Complications:            No immediate complications. Estimated Blood Loss:     Estimated blood loss: none. Impression:               - Benign-appearing esophageal stenosis. Dilated.                           - Reflux esophagitis.                           - EGD otherwise normal. Recommendation:           - Prescribe omeprazole 20 mg daily; #30; 11                            refills. Begin today.                           - Reflux precautions.                           - Resume Xarelto (rivaroxaban) at prior dose today.                           - Please contact Dr. Henrene Pastor if you have any further                            swallowing difficulties. Otherwise routine                            follow-up in his office in 1 year Rodnesha Elie N. Henrene Pastor, MD 05/06/2016 3:43:05 PM This report has been signed electronically.

## 2016-05-06 NOTE — Patient Instructions (Signed)
YOU HAD AN ENDOSCOPIC PROCEDURE TODAY AT [facility]:   Refer to the procedure report that was given to you for any specific questions about what was found during the examination.  If the procedure report does not answer your questions, please call your gastroenterologist to clarify.  If you requested that your care partner not be given the details of your procedure findings, then the procedure report has been included in a sealed envelope for you to review at your convenience later.  YOU SHOULD EXPECT: Some feelings of bloating in the abdomen. Passage of more gas than usual.  Walking can help get rid of the air that was put into your GI tract during the procedure and reduce the bloating. If you had a lower endoscopy (such as a colonoscopy or flexible sigmoidoscopy) you may notice spotting of blood in your stool or on the toilet paper. If you underwent a bowel prep for your procedure, you may not have a normal bowel movement for a few days.  Please Note:  You might notice some irritation and congestion in your nose or some drainage.  This is from the oxygen used during your procedure.  There is no need for concern and it should clear up in a day or so.  SYMPTOMS TO REPORT IMMEDIATELY:   Following lower endoscopy (colonoscopy or flexible sigmoidoscopy):  Excessive amounts of blood in the stool  Significant tenderness or worsening of abdominal pains  Swelling of the abdomen that is new, acute  Fever of 100F or higher   Following upper endoscopy (EGD)  Vomiting of blood or coffee ground material  New chest pain or pain under the shoulder blades  Painful or persistently difficult swallowing  New shortness of breath  Fever of 100F or higher  Black, tarry-looking stools  For urgent or emergent issues, a gastroenterologist can be reached at any hour by calling [phone].   DIET: Your first meal following the procedure should be a small meal and then it is ok to progress to  your normal diet. Heavy or fried foods are harder to digest and may make you feel nauseous or bloated.  Likewise, meals heavy in dairy and vegetables can increase bloating.  Drink plenty of fluids but you should avoid alcoholic beverages for 24 hours.  Polyp information given.  Esophagitis and stenosis information given.  Post dilation diet information given.  ACTIVITY:  You should plan to take it easy for the rest of today and you should NOT DRIVE or use heavy machinery until tomorrow (because of the sedation medicines used during the test).    FOLLOW UP: Our staff will call the number listed on your records the next business day following your procedure to check on you and address any questions or concerns that you may have regarding the information given to you following your procedure. If we do not reach you, we will leave a message.  However, if you are feeling well and you are not experiencing any problems, there is no need to return our call.  We will assume that you have returned to your regular daily activities without incident.  If any biopsies were taken you will be contacted by phone or by letter within the next 1-3 weeks.  Please call us at [phone] if you have not heard about the biopsies in 3 weeks.    SIGNATURES/CONFIDENTIALITY: You and/or your care partner have signed paperwork which will be entered into your electronic medical record.  These signatures attest  to the fact that that the information above on your After Visit Summary has been reviewed and is understood.  Full responsibility of the confidentiality of this discharge information lies with you and/or your care-partner. 

## 2016-05-06 NOTE — Progress Notes (Signed)
Report to PACU, RN, vss, BBS= Clear.  

## 2016-05-09 ENCOUNTER — Telehealth: Payer: Self-pay | Admitting: *Deleted

## 2016-05-09 NOTE — Telephone Encounter (Signed)
  Follow up Call-  Call back number 05/06/2016  Post procedure Call Back phone  # [phone]  Permission to leave phone message Yes  Some recent data might be hidden     Patient questions:  Do you have a fever, pain , or abdominal swelling? No. Pain Score  0 *  Have you tolerated food without any problems? Yes.    Have you been able to return to your normal activities? Yes.    Do you have any questions about your discharge instructions: Diet   No. Medications  No. Follow up visit  No.  Do you have questions or concerns about your Care? No.  Actions: * If pain score is 4 or above: No action needed, pain <4.

## 2016-05-11 ENCOUNTER — Encounter: Payer: Self-pay | Admitting: Internal Medicine

## 2016-06-16 ENCOUNTER — Ambulatory Visit (INDEPENDENT_AMBULATORY_CARE_PROVIDER_SITE_OTHER): Payer: Medicare Other | Admitting: *Deleted

## 2016-06-16 ENCOUNTER — Telehealth: Payer: Self-pay | Admitting: Cardiology

## 2016-06-16 DIAGNOSIS — I442 Atrioventricular block, complete: Secondary | ICD-10-CM

## 2016-06-16 NOTE — Telephone Encounter (Signed)
Spoke with pt and reminded pt of remote transmission that is due today. Pt verbalized understanding.   

## 2016-06-17 NOTE — Progress Notes (Signed)
Remote pacemaker transmission.   

## 2016-06-20 ENCOUNTER — Encounter: Payer: Self-pay | Admitting: Cardiology

## 2016-06-28 LAB — CUP PACEART REMOTE DEVICE CHECK
Battery Remaining Longevity: 37 mo
Brady Statistic AP VS Percent: 0 %
Brady Statistic RA Percent Paced: 0 %
Implantable Lead Implant Date: 20061130
Implantable Lead Implant Date: 20061130
Implantable Lead Implant Date: 20061130
Implantable Lead Location: 753858
Implantable Lead Location: 753859
Implantable Lead Location: 753860
Implantable Lead Model: 4076
Implantable Lead Model: 4194
Implantable Lead Model: 6949
Lead Channel Impedance Value: 608 Ohm
Lead Channel Impedance Value: 646 Ohm
Lead Channel Impedance Value: 703 Ohm
Lead Channel Pacing Threshold Amplitude: 0.375 V
Lead Channel Sensing Intrinsic Amplitude: 0.75 mV
Lead Channel Setting Pacing Amplitude: 2 V
Lead Channel Setting Sensing Sensitivity: 4 mV
MDC IDC LEAD IMPLANT DT: 20061130
MDC IDC LEAD LOCATION: 753860
MDC IDC MSMT BATTERY VOLTAGE: 2.98 V
MDC IDC MSMT LEADCHNL LV IMPEDANCE VALUE: 380 Ohm
MDC IDC MSMT LEADCHNL LV IMPEDANCE VALUE: 475 Ohm
MDC IDC MSMT LEADCHNL LV IMPEDANCE VALUE: 760 Ohm
MDC IDC MSMT LEADCHNL RA IMPEDANCE VALUE: 418 Ohm
MDC IDC MSMT LEADCHNL RA IMPEDANCE VALUE: 513 Ohm
MDC IDC MSMT LEADCHNL RA SENSING INTR AMPL: 0.75 mV
MDC IDC MSMT LEADCHNL RV IMPEDANCE VALUE: 589 Ohm
MDC IDC MSMT LEADCHNL RV PACING THRESHOLD PULSEWIDTH: 0.4 ms
MDC IDC SESS DTM: 20170908034245
MDC IDC SET LEADCHNL LV PACING AMPLITUDE: 2 V
MDC IDC SET LEADCHNL LV PACING PULSEWIDTH: 0.4 ms
MDC IDC SET LEADCHNL RV PACING PULSEWIDTH: 0.4 ms
MDC IDC STAT BRADY AP VP PERCENT: 0 %
MDC IDC STAT BRADY AS VP PERCENT: 99.96 %
MDC IDC STAT BRADY AS VS PERCENT: 0.04 %
MDC IDC STAT BRADY RV PERCENT PACED: 99.96 %

## 2016-10-11 ENCOUNTER — Ambulatory Visit (INDEPENDENT_AMBULATORY_CARE_PROVIDER_SITE_OTHER): Payer: Medicare Other | Admitting: *Deleted

## 2016-10-11 DIAGNOSIS — I442 Atrioventricular block, complete: Secondary | ICD-10-CM

## 2016-10-11 DIAGNOSIS — I428 Other cardiomyopathies: Secondary | ICD-10-CM

## 2016-10-13 NOTE — Progress Notes (Signed)
Remote pacemaker transmission.   

## 2016-10-14 ENCOUNTER — Encounter: Payer: Self-pay | Admitting: Cardiology

## 2016-10-14 LAB — CUP PACEART REMOTE DEVICE CHECK
Battery Remaining Longevity: 42 mo
Battery Voltage: 2.98 V
Brady Statistic AP VP Percent: 0 %
Brady Statistic AS VP Percent: 99.92 %
Brady Statistic RV Percent Paced: 99.95 %
Date Time Interrogation Session: 20171231082706
Implantable Lead Implant Date: 20061130
Implantable Lead Implant Date: 20061130
Implantable Lead Location: 753860
Implantable Lead Model: 4076
Implantable Lead Model: 4194
Implantable Lead Model: 6949
Lead Channel Impedance Value: 342 Ohm
Lead Channel Impedance Value: 399 Ohm
Lead Channel Impedance Value: 532 Ohm
Lead Channel Impedance Value: 589 Ohm
Lead Channel Impedance Value: 722 Ohm
Lead Channel Pacing Threshold Pulse Width: 0.4 ms
Lead Channel Setting Pacing Amplitude: 2 V
Lead Channel Setting Pacing Pulse Width: 0.4 ms
Lead Channel Setting Pacing Pulse Width: 0.4 ms
Lead Channel Setting Sensing Sensitivity: 4 mV
MDC IDC LEAD IMPLANT DT: 20061130
MDC IDC LEAD IMPLANT DT: 20061130
MDC IDC LEAD LOCATION: 753858
MDC IDC LEAD LOCATION: 753859
MDC IDC LEAD LOCATION: 753860
MDC IDC MSMT LEADCHNL LV IMPEDANCE VALUE: 456 Ohm
MDC IDC MSMT LEADCHNL LV IMPEDANCE VALUE: 551 Ohm
MDC IDC MSMT LEADCHNL LV IMPEDANCE VALUE: 646 Ohm
MDC IDC MSMT LEADCHNL RA IMPEDANCE VALUE: 513 Ohm
MDC IDC MSMT LEADCHNL RA SENSING INTR AMPL: 0.875 mV
MDC IDC MSMT LEADCHNL RA SENSING INTR AMPL: 0.875 mV
MDC IDC MSMT LEADCHNL RV PACING THRESHOLD AMPLITUDE: 0.375 V
MDC IDC PG IMPLANT DT: 20140717
MDC IDC SET LEADCHNL RV PACING AMPLITUDE: 2 V
MDC IDC STAT BRADY AP VS PERCENT: 0 %
MDC IDC STAT BRADY AS VS PERCENT: 0.08 %
MDC IDC STAT BRADY RA PERCENT PACED: 0 %

## 2016-10-27 ENCOUNTER — Encounter: Payer: Medicare Other | Admitting: Internal Medicine

## 2016-11-10 ENCOUNTER — Encounter: Payer: Medicare Other | Admitting: Internal Medicine

## 2016-11-14 ENCOUNTER — Ambulatory Visit (INDEPENDENT_AMBULATORY_CARE_PROVIDER_SITE_OTHER): Payer: Medicare Other | Admitting: Internal Medicine

## 2016-11-14 ENCOUNTER — Encounter: Payer: Self-pay | Admitting: Internal Medicine

## 2016-11-14 VITALS — BP 118/70 | HR 87 | Ht 77.0 in | Wt 255.6 lb

## 2016-11-14 DIAGNOSIS — I5032 Chronic diastolic (congestive) heart failure: Secondary | ICD-10-CM

## 2016-11-14 DIAGNOSIS — I442 Atrioventricular block, complete: Secondary | ICD-10-CM

## 2016-11-14 DIAGNOSIS — I481 Persistent atrial fibrillation: Secondary | ICD-10-CM | POA: Diagnosis not present

## 2016-11-14 DIAGNOSIS — I4819 Other persistent atrial fibrillation: Secondary | ICD-10-CM

## 2016-11-14 LAB — CUP PACEART INCLINIC DEVICE CHECK
Battery Remaining Longevity: 37 mo
Battery Voltage: 2.97 V
Brady Statistic AP VP Percent: 0 %
Brady Statistic RA Percent Paced: 0 %
Date Time Interrogation Session: 20180205173249
Implantable Lead Implant Date: 20061130
Implantable Lead Implant Date: 20061130
Implantable Lead Location: 753858
Implantable Lead Location: 753859
Implantable Lead Model: 4076
Implantable Lead Model: 4194
Implantable Lead Model: 6949
Implantable Pulse Generator Implant Date: 20140717
Lead Channel Impedance Value: 532 Ohm
Lead Channel Impedance Value: 589 Ohm
Lead Channel Impedance Value: 684 Ohm
Lead Channel Pacing Threshold Amplitude: 0.375 V
Lead Channel Setting Pacing Amplitude: 1.25 V
Lead Channel Setting Pacing Amplitude: 2 V
Lead Channel Setting Pacing Pulse Width: 0.4 ms
Lead Channel Setting Pacing Pulse Width: 0.4 ms
Lead Channel Setting Sensing Sensitivity: 4 mV
MDC IDC LEAD IMPLANT DT: 20061130
MDC IDC LEAD IMPLANT DT: 20061130
MDC IDC LEAD LOCATION: 753860
MDC IDC LEAD LOCATION: 753860
MDC IDC MSMT LEADCHNL LV IMPEDANCE VALUE: 342 Ohm
MDC IDC MSMT LEADCHNL LV IMPEDANCE VALUE: 437 Ohm
MDC IDC MSMT LEADCHNL LV IMPEDANCE VALUE: 551 Ohm
MDC IDC MSMT LEADCHNL LV IMPEDANCE VALUE: 646 Ohm
MDC IDC MSMT LEADCHNL RA IMPEDANCE VALUE: 399 Ohm
MDC IDC MSMT LEADCHNL RA IMPEDANCE VALUE: 494 Ohm
MDC IDC MSMT LEADCHNL RA SENSING INTR AMPL: 0.75 mV
MDC IDC MSMT LEADCHNL RA SENSING INTR AMPL: 0.875 mV
MDC IDC MSMT LEADCHNL RV PACING THRESHOLD PULSEWIDTH: 0.4 ms
MDC IDC STAT BRADY AP VS PERCENT: 0 %
MDC IDC STAT BRADY AS VP PERCENT: 99.91 %
MDC IDC STAT BRADY AS VS PERCENT: 0.09 %
MDC IDC STAT BRADY RV PERCENT PACED: 99.94 %

## 2016-11-14 NOTE — Progress Notes (Signed)
PCP: Marton Redwood, MD Primary Cardiologist:  Dr Sherrilyn Rist is a 71 y.o. male who presents today for routine electrophysiology followup.  Since last being seen in our clinic, the patient reports doing very well.  No concerns today. Today, he denies symptoms of palpitations, chest pain, shortness of breath,  lower extremity edema, dizziness, presyncope, or syncope.  The patient is otherwise without complaint today.   Past Medical History:  Diagnosis Date  . AAA (abdominal aortic aneurysm) ([place])   . Arthritis   . Atrial fibrillation ([place])    permanent  . CAD (coronary artery disease)   . Chronic systolic dysfunction of left ventricle    s/p BiV ICD//downgrade to CRT-P (medtronic) on 04-25-2013 by Dr Rayann Heman  . Colon polyps   . Complete heart block (HCC)    s/p AV nodal ablation for afib  . Dysphagia   . Elevated LFTs   . Fatigue   . GERD (gastroesophageal reflux disease)   . Gout   . Hemochromatosis   . Hyperlipidemia   . IFG (impaired fasting glucose)   . Osteoarthritis   . Sleep apnea    Past Surgical History:  Procedure Laterality Date  . AV NODE ABLATION    . BIV PACEMAKER GENERATOR CHANGE OUT  04-25-2013   downgrade of previously implanted CRTD to Medtronic CRTP by Dr Rayann Heman.  Previously implanted LV and 4076 RV leads were used  . CARDIAC DEFIBRILLATOR PLACEMENT  2007, 2010   MDT BiV ICD,  [phone] RV lead, though pace/ sense is a Medtronic model 4076 - 58  lead; downgraded to CRTP on 04-25-2013 by Dr Rayann Heman  . HAND SURGERY     left  . HERNIA REPAIR    . HYDROCELE EXCISION    . IMPLANTABLE CARDIOVERTER DEFIBRILLATOR GENERATOR CHANGE N/A 04/25/2013   Procedure: IMPLANTABLE CARDIOVERTER DEFIBRILLATOR GENERATOR CHANGE;  Surgeon: Thompson Grayer, MD;  Location: [facility] CATH LAB;  Service: Cardiovascular;  Laterality: N/A;  . PACEMAKER INSERTION  2006  . REPLACEMENT TOTAL KNEE  2000, 2011 x2   bilateral    ROS- all systems are reviewed and negative except as per HPI  above  Current Outpatient Prescriptions  Medication Sig Dispense Refill  . allopurinol (ZYLOPRIM) 300 MG tablet Take 1 tablet by mouth daily  11  . carvedilol (COREG) 25 MG tablet Take 0.5 tablets (12.5 mg total) by mouth 2 (two) times daily with a meal.    . clonazePAM (KLONOPIN) 0.5 MG tablet Take 0.25-0.5 mg by mouth at bedtime as needed (sleep).     . furosemide (LASIX) 20 MG tablet Take 20 mg by mouth daily.    Marland Kitchen omeprazole (PRILOSEC) 20 MG capsule Take 1 capsule (20 mg total) by mouth daily. 30 capsule 11  . Rivaroxaban (XARELTO) 20 MG TABS tablet Take 1 tablet (20 mg total) by mouth daily. 30 tablet 3  . simvastatin (ZOCOR) 40 MG tablet Take 20 mg by mouth every evening.     . valsartan (DIOVAN) 80 MG tablet Take 80 mg by mouth daily.     Current Facility-Administered Medications  Medication Dose Route Frequency Provider Last Rate Last Dose  . 0.9 %  sodium chloride infusion  500 mL Intravenous Continuous Irene Shipper, MD        Physical Exam: Vitals:   11/14/16 1603  BP: 118/70  Pulse: 87  Weight: 255 lb 9.6 oz (115.9 kg)  Height: 6\' 5"  (1.956 m)    GEN- The patient is well appearing, alert  and oriented x 3 today.   Head- normocephalic, atraumatic Eyes-  Sclera clear, conjunctiva pink Ears- hearing intact Oropharynx- clear Lungs- Clear to ausculation bilaterally, normal work of breathing Chest- pacemaker pocket is well healed.  There is a seborrheic keratosis over the upper border of the pocket.  He says that he has had this "Scraped" by dermatology several times previously.  I have advised against this given risks of infection. Heart- Regular rate and rhythm (paced) GI- soft, NT, ND, + BS Extremities- no clubbing, cyanosis, or edema  Pacemaker interrogation- reviewed in detail today,  See PACEART report ekg today reveals afib, V paced  Assessment and Plan:  1. Complete heart block Normal pacemaker function See Pace Art report Very good RV and LV thresholds (0.5V  @0 .4 msec in both).  He was previously programmed 2V @0 .4 msec in both.  Today, I have adjusted LV threshold to 1.25V @0 .4 msec to maximize his battery longevity as he has had multiple prior devices  2. Permanent afib Continue long term anticoagulation  3. OSA Doing well with CPAP management by Dr Maxwell Caul  carelink Return to see me in 1 year  Thompson Grayer MD, [facility] 11/14/2016 4:29 PM

## 2016-11-14 NOTE — Patient Instructions (Addendum)
Medication Instructions:  Your physician recommends that you continue on your current medications as directed. Please refer to the Current Medication list given to you today.    Labwork: None ordered   Testing/Procedures: None ordered   Follow-Up: Your physician wants you to follow-up in: 12 months with Dr Rayann Heman Dennis Bast will receive a reminder letter in the mail two months in advance. If you don't receive a letter, please call our office to schedule the follow-up appointment.   Remote monitoring is used to monitor your Pacemaker  from home. This monitoring reduces the number of office visits required to check your device to one time per year. It allows [place] to keep an eye on the functioning of your device to ensure it is working properly. You are scheduled for a device check from home on 02/13/17. You may send your transmission at any time that day. If you have a wireless device, the transmission will be sent automatically. After your physician reviews your transmission, you will receive a postcard with your next transmission date.    Any Other Special Instructions Will Be Listed Below (If Applicable).     If you need a refill on your cardiac medications before your next appointment, please call your pharmacy.

## 2016-11-25 ENCOUNTER — Telehealth: Payer: Self-pay | Admitting: Internal Medicine

## 2016-11-25 NOTE — Telephone Encounter (Signed)
Transmission reviewed. Normal CRTP function. Chronic AF, BiV pacing, capture noted. HF diagnostic stable.

## 2016-11-25 NOTE — Telephone Encounter (Signed)
New Message     Pt states that he is lightheaded and dizziness, this has been happening since he came into office on 11/14/16 for pacemaker check . He states he has minimal SOB no chest pain. Has not check his bp since he was in office.   Spoke w Jeani Hawking she is going to check with device clinic and get back with pt

## 2016-11-25 NOTE — Telephone Encounter (Signed)
Spoke with [name]. Requested remote transmission- he will send it now.   He reports that he is dizzy periodically (mornings and about 2pm) since his appt 11/14/16. Not associated with position change. He says that he has taken his BP several times since calling- readings: 74/58, 93/62, 99/66, 103/68. He reports that his BP is normally 118/70 as at last OV.  I will review transmission once received to verify PPM function.

## 2016-11-25 NOTE — Telephone Encounter (Signed)
I spoke with pt, pt states last BP was 103/71. Pt states he has had intermittent dizziness, no pattern, and was concerned adjustment  may have been made to his pacemaker when he saw dr Rayann Heman 11/14/16 that may be cause of dizziness.  Dr Lovena Le reviewed transmission report from earlier today, Dr Jackalyn Lombard 11/14/16 note, and BP readings reported by pt.  Dr Lovena Le recommended pt stay well hydrated, can eat a little more salt, continue to monitor BP and symptoms, call if they do not improve, medications may need to be adjusted.  Pt advised, verbalized understanding, agreed with plan.

## 2016-12-20 ENCOUNTER — Telehealth: Payer: Self-pay | Admitting: Internal Medicine

## 2016-12-20 NOTE — Telephone Encounter (Signed)
New Message  Pt voiced he's had a nose bleed for 18 hrs and cannot get it stop.  Please f/u

## 2016-12-20 NOTE — Telephone Encounter (Signed)
Reports monthly nose bleeds  - that may avg an hour.  Current nose bleed has lasted for the last 18 hr. States he wears CPAP w/o humidification.  He says humidification is too hot and he can't sleep with it that way. Advised pt to try Afrin nasal spray - one spray in each nostril x 1,  and if this does not help then he needs to proceed to urgent care to have bleed cauterized.  Patient verbalized understanding and agreeable to plan.

## 2017-02-13 ENCOUNTER — Telehealth: Payer: Self-pay | Admitting: Cardiology

## 2017-02-13 ENCOUNTER — Ambulatory Visit (INDEPENDENT_AMBULATORY_CARE_PROVIDER_SITE_OTHER): Payer: Medicare Other | Admitting: *Deleted

## 2017-02-13 DIAGNOSIS — I442 Atrioventricular block, complete: Secondary | ICD-10-CM

## 2017-02-13 NOTE — Telephone Encounter (Signed)
Spoke with pt and reminded pt of remote transmission that is due today. Pt verbalized understanding.   

## 2017-02-14 LAB — CUP PACEART REMOTE DEVICE CHECK
Battery Voltage: 2.97 V
Brady Statistic RA Percent Paced: 0 %
Implantable Lead Implant Date: 20061130
Implantable Lead Implant Date: 20061130
Implantable Lead Location: 753858
Implantable Lead Location: 753859
Implantable Lead Location: 753860
Implantable Lead Model: 4194
Implantable Pulse Generator Implant Date: 20140717
Lead Channel Impedance Value: 570 Ohm
Lead Channel Impedance Value: 627 Ohm
Lead Channel Impedance Value: 627 Ohm
Lead Channel Pacing Threshold Amplitude: 0.5 V
Lead Channel Sensing Intrinsic Amplitude: 1 mV
Lead Channel Sensing Intrinsic Amplitude: 1 mV
Lead Channel Setting Pacing Amplitude: 2 V
Lead Channel Setting Pacing Pulse Width: 0.4 ms
MDC IDC LEAD IMPLANT DT: 20061130
MDC IDC LEAD IMPLANT DT: 20061130
MDC IDC LEAD LOCATION: 753860
MDC IDC MSMT BATTERY REMAINING LONGEVITY: 39 mo
MDC IDC MSMT LEADCHNL LV IMPEDANCE VALUE: 361 Ohm
MDC IDC MSMT LEADCHNL LV IMPEDANCE VALUE: 475 Ohm
MDC IDC MSMT LEADCHNL LV IMPEDANCE VALUE: 722 Ohm
MDC IDC MSMT LEADCHNL LV IMPEDANCE VALUE: 798 Ohm
MDC IDC MSMT LEADCHNL RA IMPEDANCE VALUE: 418 Ohm
MDC IDC MSMT LEADCHNL RA IMPEDANCE VALUE: 513 Ohm
MDC IDC MSMT LEADCHNL RV PACING THRESHOLD PULSEWIDTH: 0.4 ms
MDC IDC SESS DTM: 20180508004459
MDC IDC SET LEADCHNL LV PACING AMPLITUDE: 1.25 V
MDC IDC SET LEADCHNL LV PACING PULSEWIDTH: 0.4 ms
MDC IDC SET LEADCHNL RV SENSING SENSITIVITY: 4 mV
MDC IDC STAT BRADY AP VP PERCENT: 0 %
MDC IDC STAT BRADY AP VS PERCENT: 0 %
MDC IDC STAT BRADY AS VP PERCENT: 97.52 %
MDC IDC STAT BRADY AS VS PERCENT: 2.48 %
MDC IDC STAT BRADY RV PERCENT PACED: 98.34 %

## 2017-02-14 NOTE — Progress Notes (Signed)
Remote pacemaker transmission.   

## 2017-02-17 ENCOUNTER — Encounter: Payer: Self-pay | Admitting: Cardiology

## 2017-04-26 ENCOUNTER — Other Ambulatory Visit: Payer: Self-pay | Admitting: Dermatology

## 2017-05-03 ENCOUNTER — Encounter: Payer: Self-pay | Admitting: Internal Medicine

## 2017-05-15 ENCOUNTER — Encounter: Payer: Medicare Other | Admitting: *Deleted

## 2017-05-17 ENCOUNTER — Encounter: Payer: Self-pay | Admitting: Cardiology

## 2017-05-24 ENCOUNTER — Ambulatory Visit (INDEPENDENT_AMBULATORY_CARE_PROVIDER_SITE_OTHER): Payer: Medicare Other | Admitting: *Deleted

## 2017-05-24 DIAGNOSIS — I428 Other cardiomyopathies: Secondary | ICD-10-CM | POA: Diagnosis not present

## 2017-05-24 DIAGNOSIS — I442 Atrioventricular block, complete: Secondary | ICD-10-CM | POA: Diagnosis not present

## 2017-05-24 DIAGNOSIS — I5032 Chronic diastolic (congestive) heart failure: Secondary | ICD-10-CM | POA: Diagnosis not present

## 2017-05-24 NOTE — Progress Notes (Signed)
Remote pacemaker check. 

## 2017-05-25 LAB — CUP PACEART REMOTE DEVICE CHECK
Brady Statistic AP VP Percent: 0 %
Brady Statistic AP VS Percent: 0 %
Brady Statistic AS VP Percent: 97.04 %
Brady Statistic AS VS Percent: 2.96 %
Brady Statistic RA Percent Paced: 0 %
Date Time Interrogation Session: 20180814092110
Implantable Lead Implant Date: 20061130
Implantable Lead Location: 753858
Implantable Lead Location: 753860
Implantable Lead Model: 4194
Implantable Lead Model: 6949
Lead Channel Impedance Value: 361 Ohm
Lead Channel Impedance Value: 399 Ohm
Lead Channel Impedance Value: 513 Ohm
Lead Channel Impedance Value: 570 Ohm
Lead Channel Impedance Value: 665 Ohm
Lead Channel Impedance Value: 741 Ohm
Lead Channel Sensing Intrinsic Amplitude: 0.875 mV
Lead Channel Setting Pacing Amplitude: 1.25 V
Lead Channel Setting Pacing Pulse Width: 0.4 ms
Lead Channel Setting Sensing Sensitivity: 4 mV
MDC IDC LEAD IMPLANT DT: 20061130
MDC IDC LEAD IMPLANT DT: 20061130
MDC IDC LEAD IMPLANT DT: 20061130
MDC IDC LEAD LOCATION: 753859
MDC IDC LEAD LOCATION: 753860
MDC IDC MSMT BATTERY REMAINING LONGEVITY: 37 mo
MDC IDC MSMT BATTERY VOLTAGE: 2.97 V
MDC IDC MSMT LEADCHNL LV IMPEDANCE VALUE: 475 Ohm
MDC IDC MSMT LEADCHNL RA IMPEDANCE VALUE: 475 Ohm
MDC IDC MSMT LEADCHNL RA SENSING INTR AMPL: 0.875 mV
MDC IDC MSMT LEADCHNL RV IMPEDANCE VALUE: 570 Ohm
MDC IDC MSMT LEADCHNL RV PACING THRESHOLD AMPLITUDE: 0.375 V
MDC IDC MSMT LEADCHNL RV PACING THRESHOLD PULSEWIDTH: 0.4 ms
MDC IDC PG IMPLANT DT: 20140717
MDC IDC SET LEADCHNL LV PACING PULSEWIDTH: 0.4 ms
MDC IDC SET LEADCHNL RV PACING AMPLITUDE: 2 V
MDC IDC STAT BRADY RV PERCENT PACED: 97.89 %

## 2017-06-06 ENCOUNTER — Encounter: Payer: Self-pay | Admitting: Cardiology

## 2017-07-17 ENCOUNTER — Other Ambulatory Visit: Payer: Self-pay | Admitting: Orthopedic Surgery

## 2017-07-17 DIAGNOSIS — M25511 Pain in right shoulder: Principal | ICD-10-CM

## 2017-07-17 DIAGNOSIS — G8929 Other chronic pain: Secondary | ICD-10-CM

## 2017-07-27 ENCOUNTER — Other Ambulatory Visit: Payer: Self-pay | Admitting: Orthopedic Surgery

## 2017-07-27 ENCOUNTER — Other Ambulatory Visit: Payer: Self-pay | Admitting: Cardiology

## 2017-07-27 DIAGNOSIS — M25511 Pain in right shoulder: Secondary | ICD-10-CM

## 2017-08-10 ENCOUNTER — Ambulatory Visit
Admission: RE | Admit: 2017-08-10 | Discharge: 2017-08-10 | Disposition: A | Discharge status: Home / Self Care | Payer: Medicare Other | Source: Ambulatory Visit | Attending: Orthopedic Surgery | Admitting: Orthopedic Surgery

## 2017-08-10 DIAGNOSIS — M25511 Pain in right shoulder: Secondary | ICD-10-CM

## 2017-08-10 MED ORDER — IOPAMIDOL (ISOVUE-M 200) INJECTION 41%
12.0000 mL | Freq: Once | INTRAMUSCULAR | Status: AC
  Administered 2017-08-10: 12 mL via INTRA_ARTICULAR

## 2017-08-23 ENCOUNTER — Telehealth: Payer: Self-pay | Admitting: Cardiology

## 2017-08-23 ENCOUNTER — Encounter: Payer: Medicare Other | Admitting: *Deleted

## 2017-08-23 NOTE — Telephone Encounter (Signed)
LMOVM reminding pt to send remote transmission.   

## 2017-08-24 ENCOUNTER — Encounter: Payer: Self-pay | Admitting: Cardiology

## 2017-10-09 ENCOUNTER — Ambulatory Visit (INDEPENDENT_AMBULATORY_CARE_PROVIDER_SITE_OTHER): Payer: Medicare Other | Admitting: *Deleted

## 2017-10-09 DIAGNOSIS — I442 Atrioventricular block, complete: Secondary | ICD-10-CM | POA: Diagnosis not present

## 2017-10-09 DIAGNOSIS — I5032 Chronic diastolic (congestive) heart failure: Secondary | ICD-10-CM

## 2017-10-09 DIAGNOSIS — I428 Other cardiomyopathies: Secondary | ICD-10-CM

## 2017-10-09 NOTE — Progress Notes (Signed)
Remote pacemaker transmission.   

## 2017-10-11 LAB — CUP PACEART REMOTE DEVICE CHECK
Battery Voltage: 2.95 V
Brady Statistic RA Percent Paced: 0 %
Brady Statistic RV Percent Paced: 99.67 %
Implantable Lead Implant Date: 20061130
Implantable Lead Implant Date: 20061130
Implantable Lead Implant Date: 20061130
Implantable Lead Location: 753858
Implantable Lead Location: 753859
Implantable Lead Model: 4076
Implantable Lead Model: 4194
Implantable Pulse Generator Implant Date: 20140717
Lead Channel Impedance Value: 342 Ohm
Lead Channel Impedance Value: 494 Ohm
Lead Channel Impedance Value: 551 Ohm
Lead Channel Impedance Value: 570 Ohm
Lead Channel Pacing Threshold Amplitude: 0.375 V
Lead Channel Pacing Threshold Pulse Width: 0.4 ms
Lead Channel Sensing Intrinsic Amplitude: 1.125 mV
Lead Channel Sensing Intrinsic Amplitude: 1.125 mV
Lead Channel Setting Pacing Amplitude: 2 V
Lead Channel Setting Pacing Pulse Width: 0.4 ms
Lead Channel Setting Pacing Pulse Width: 0.4 ms
MDC IDC LEAD IMPLANT DT: 20061130
MDC IDC LEAD LOCATION: 753860
MDC IDC LEAD LOCATION: 753860
MDC IDC MSMT BATTERY REMAINING LONGEVITY: 35 mo
MDC IDC MSMT LEADCHNL LV IMPEDANCE VALUE: 456 Ohm
MDC IDC MSMT LEADCHNL LV IMPEDANCE VALUE: 665 Ohm
MDC IDC MSMT LEADCHNL LV IMPEDANCE VALUE: 722 Ohm
MDC IDC MSMT LEADCHNL RA IMPEDANCE VALUE: 399 Ohm
MDC IDC MSMT LEADCHNL RV IMPEDANCE VALUE: 608 Ohm
MDC IDC SESS DTM: 20181230210430
MDC IDC SET LEADCHNL LV PACING AMPLITUDE: 1.25 V
MDC IDC SET LEADCHNL RV SENSING SENSITIVITY: 4 mV
MDC IDC STAT BRADY AP VP PERCENT: 0 %
MDC IDC STAT BRADY AP VS PERCENT: 0 %
MDC IDC STAT BRADY AS VP PERCENT: 99.52 %
MDC IDC STAT BRADY AS VS PERCENT: 0.48 %

## 2017-10-12 ENCOUNTER — Other Ambulatory Visit: Payer: Self-pay | Admitting: Orthopedic Surgery

## 2017-10-12 DIAGNOSIS — M25512 Pain in left shoulder: Secondary | ICD-10-CM

## 2017-10-13 ENCOUNTER — Encounter: Payer: Self-pay | Admitting: Cardiology

## 2017-10-27 ENCOUNTER — Ambulatory Visit: Payer: Medicare Other | Admitting: Cardiology

## 2017-10-27 ENCOUNTER — Telehealth: Payer: Self-pay | Admitting: Internal Medicine

## 2017-10-27 ENCOUNTER — Encounter: Payer: Self-pay | Admitting: *Deleted

## 2017-10-27 NOTE — Telephone Encounter (Addendum)
Discussed with Dr Rayann Heman and will put a new letter for him out front.  Dr Rayann Heman will also refer him to Dr End for general Cardioloy as Dr Aundra Dubin is at hospital now,  He is aware we will get him an appointment with Dr End to establish

## 2017-10-27 NOTE — Telephone Encounter (Signed)
New message  Pt verbalized that he is calling for RN

## 2017-11-14 ENCOUNTER — Encounter: Payer: Self-pay | Admitting: Internal Medicine

## 2017-11-20 ENCOUNTER — Ambulatory Visit (INDEPENDENT_AMBULATORY_CARE_PROVIDER_SITE_OTHER): Payer: Medicare Other | Admitting: Internal Medicine

## 2017-11-20 ENCOUNTER — Encounter (INDEPENDENT_AMBULATORY_CARE_PROVIDER_SITE_OTHER): Payer: Self-pay

## 2017-11-20 ENCOUNTER — Encounter: Payer: Self-pay | Admitting: Internal Medicine

## 2017-11-20 VITALS — BP 110/78 | HR 68 | Ht 77.0 in | Wt 262.0 lb

## 2017-11-20 DIAGNOSIS — I442 Atrioventricular block, complete: Secondary | ICD-10-CM

## 2017-11-20 DIAGNOSIS — I4821 Permanent atrial fibrillation: Secondary | ICD-10-CM

## 2017-11-20 DIAGNOSIS — I519 Heart disease, unspecified: Secondary | ICD-10-CM

## 2017-11-20 DIAGNOSIS — I482 Chronic atrial fibrillation: Secondary | ICD-10-CM | POA: Diagnosis not present

## 2017-11-20 DIAGNOSIS — I428 Other cardiomyopathies: Secondary | ICD-10-CM | POA: Diagnosis not present

## 2017-11-20 LAB — CUP PACEART INCLINIC DEVICE CHECK
Battery Remaining Longevity: 33 mo
Battery Voltage: 2.95 V
Brady Statistic AP VP Percent: 0 %
Brady Statistic AS VP Percent: 98.34 %
Brady Statistic RA Percent Paced: 0 %
Brady Statistic RV Percent Paced: 98.85 %
Date Time Interrogation Session: 20190211170402
Implantable Lead Implant Date: 20061130
Implantable Lead Implant Date: 20061130
Implantable Lead Location: 753859
Implantable Lead Location: 753860
Implantable Lead Model: 4194
Lead Channel Impedance Value: 342 Ohm
Lead Channel Impedance Value: 380 Ohm
Lead Channel Impedance Value: 437 Ohm
Lead Channel Impedance Value: 475 Ohm
Lead Channel Impedance Value: 646 Ohm
Lead Channel Pacing Threshold Amplitude: 0.5 V
Lead Channel Pacing Threshold Pulse Width: 0.4 ms
Lead Channel Sensing Intrinsic Amplitude: 0.75 mV
Lead Channel Setting Pacing Amplitude: 2 V
Lead Channel Setting Pacing Pulse Width: 0.4 ms
MDC IDC LEAD IMPLANT DT: 20061130
MDC IDC LEAD IMPLANT DT: 20061130
MDC IDC LEAD LOCATION: 753858
MDC IDC LEAD LOCATION: 753860
MDC IDC MSMT LEADCHNL LV IMPEDANCE VALUE: 551 Ohm
MDC IDC MSMT LEADCHNL LV IMPEDANCE VALUE: 703 Ohm
MDC IDC MSMT LEADCHNL LV PACING THRESHOLD AMPLITUDE: 0.75 V
MDC IDC MSMT LEADCHNL LV PACING THRESHOLD PULSEWIDTH: 0.4 ms
MDC IDC MSMT LEADCHNL RA SENSING INTR AMPL: 0.75 mV
MDC IDC MSMT LEADCHNL RV IMPEDANCE VALUE: 494 Ohm
MDC IDC MSMT LEADCHNL RV IMPEDANCE VALUE: 551 Ohm
MDC IDC PG IMPLANT DT: 20140717
MDC IDC SET LEADCHNL LV PACING AMPLITUDE: 1.25 V
MDC IDC SET LEADCHNL RV PACING PULSEWIDTH: 0.4 ms
MDC IDC SET LEADCHNL RV SENSING SENSITIVITY: 4 mV
MDC IDC STAT BRADY AP VS PERCENT: 0 %
MDC IDC STAT BRADY AS VS PERCENT: 1.66 %

## 2017-11-20 NOTE — Progress Notes (Signed)
PCP: Marton Redwood, MD Primary Cardiologist:  Previously Dr Aundra Dubin Primary EP:  Dr Rayann Heman  [name] is a 72 y.o. male who presents today for routine electrophysiology followup.  Since last being seen in our clinic, the patient reports doing very well.  + fatigue.  Sleepy at times.  Uses CPAP and recent had it optimized by Dr Maxwell Caul.  Today, he denies symptoms of palpitations, chest pain, shortness of breath,  lower extremity edema, dizziness, presyncope, or syncope.  The patient is otherwise without complaint today.   Past Medical History:  Diagnosis Date  . AAA (abdominal aortic aneurysm) ([place])   . Arthritis   . Atrial fibrillation ([place])    permanent  . CAD (coronary artery disease)   . Chronic systolic dysfunction of left ventricle    s/p BiV ICD//downgrade to CRT-P (medtronic) on 04-25-2013 by Dr Rayann Heman  . Colon polyps   . Complete heart block (HCC)    s/p AV nodal ablation for afib  . Dysphagia   . Elevated LFTs   . Fatigue   . GERD (gastroesophageal reflux disease)   . Gout   . Hemochromatosis   . Hyperlipidemia   . IFG (impaired fasting glucose)   . Osteoarthritis   . Sleep apnea    Past Surgical History:  Procedure Laterality Date  . AV NODE ABLATION    . BIV PACEMAKER GENERATOR CHANGE OUT  04-25-2013   downgrade of previously implanted CRTD to Medtronic CRTP by Dr Rayann Heman.  Previously implanted LV and 4076 RV leads were used  . CARDIAC DEFIBRILLATOR PLACEMENT  2007, 2010   MDT BiV ICD,  [phone] RV lead, though pace/ sense is a Medtronic model 4076 - 58  lead; downgraded to CRTP on 04-25-2013 by Dr Rayann Heman  . HAND SURGERY     left  . HERNIA REPAIR    . HYDROCELE EXCISION    . IMPLANTABLE CARDIOVERTER DEFIBRILLATOR GENERATOR CHANGE N/A 04/25/2013   Procedure: IMPLANTABLE CARDIOVERTER DEFIBRILLATOR GENERATOR CHANGE;  Surgeon: Thompson Grayer, MD;  Location: [facility] CATH LAB;  Service: Cardiovascular;  Laterality: N/A;  . PACEMAKER INSERTION  2006  . REPLACEMENT TOTAL KNEE   2000, 2011 x2   bilateral    ROS- all systems are reviewed and negative except as per HPI above  Current Outpatient Medications  Medication Sig Dispense Refill  . allopurinol (ZYLOPRIM) 300 MG tablet Take 1 tablet by mouth daily  11  . furosemide (LASIX) 20 MG tablet Take 20 mg by mouth daily.    . Rivaroxaban (XARELTO) 20 MG TABS tablet Take 1 tablet (20 mg total) by mouth daily. 30 tablet 3  . simvastatin (ZOCOR) 40 MG tablet Take 20 mg by mouth every evening.     . valsartan (DIOVAN) 80 MG tablet Take 80 mg by mouth daily.     Current Facility-Administered Medications  Medication Dose Route Frequency Provider Last Rate Last Dose  . 0.9 %  sodium chloride infusion  500 mL Intravenous Continuous Irene Shipper, MD        Physical Exam: Vitals:   11/20/17 1631  BP: 110/78  Pulse: 68  Weight: 262 lb (118.8 kg)  Height: 6\' 5"  (1.956 m)    GEN- The patient is well appearing, alert and oriented x 3 today.   Head- normocephalic, atraumatic Eyes-  Sclera clear, conjunctiva pink Ears- hearing intact Oropharynx- clear Lungs- Clear to ausculation bilaterally, normal work of breathing Chest- pacemaker pocket is well healed Heart- Regular rate and rhythm (paced) GI-  soft, NT, ND, + BS Extremities- no clubbing, cyanosis, or edema  Pacemaker interrogation- reviewed in detail today,  See PACEART report  ekg tracing ordered today is personally reviewed and shows afib, biV paced 70 bpm, PVCs  Assessment and Plan:  1. Symptomatic complete heart block (s/p AV nodal ablation) Normal pacemaker function See Pace Art report No changes today Threshold in LV is chronically low and therefore I have left LV output at 1.25V @ 0.4 msec to preserve battery as he has had multiple prior devices and we want to extend battery life as long as able  2. Permanent afib Continue long term anticoagulation  3. OSA Compliant with CPAP Followed by Dr Maxwell Caul  4. Chronic systolic dysfunction EF  normalized with CRT Would consider updating echo when he sees Dr End Consider switching valsartan to entresto on follow-up with Dr End Will enroll [facility] device [facility] with Sharman Cheek Mildly elevated optivol today Sodium restriction advised euvolemic on exam  Carelink Return to see EP NP in a year Follow-up to establish with Dr End as scheduled  Thompson Grayer MD, [facility] 11/20/2017 4:38 PM

## 2017-11-20 NOTE — Patient Instructions (Addendum)
Medication Instructions:  Your physician recommends that you continue on your current medications as directed. Please refer to the Current Medication list given to you today.   Labwork: None ordered  Testing/Procedures: None ordered  Follow-Up: You have been referred to [facility] with Sharman Cheek. Information Sheet Given.   Remote monitoring is used to monitor your Pacemaker from home. This monitoring reduces the number of office visits required to check your device to one time per year. It allows [place] to keep an eye on the functioning of your device to ensure it is working properly. You are scheduled for a device check from home on 01/08/18. You may send your transmission at any time that day. If you have a wireless device, the transmission will be sent automatically. After your physician reviews your transmission, you will receive a postcard with your next transmission date.  Your physician wants you to follow-up in: 1 year with Chanetta Marshall, NP. You will receive a reminder letter in the mail two months in advance. If you don't receive a letter, please call our office to schedule the follow-up appointment.    Any Other Special Instructions Will Be Listed Below (If Applicable).     If you need a refill on your cardiac medications before your next appointment, please call your pharmacy.

## 2017-11-23 ENCOUNTER — Ambulatory Visit (INDEPENDENT_AMBULATORY_CARE_PROVIDER_SITE_OTHER): Payer: Medicare Other | Admitting: Internal Medicine

## 2017-11-23 ENCOUNTER — Encounter: Payer: Self-pay | Admitting: Internal Medicine

## 2017-11-23 ENCOUNTER — Encounter (INDEPENDENT_AMBULATORY_CARE_PROVIDER_SITE_OTHER): Payer: Self-pay

## 2017-11-23 VITALS — BP 124/82 | HR 74 | Ht 77.0 in | Wt 259.8 lb

## 2017-11-23 DIAGNOSIS — G4733 Obstructive sleep apnea (adult) (pediatric): Secondary | ICD-10-CM | POA: Diagnosis not present

## 2017-11-23 DIAGNOSIS — I482 Chronic atrial fibrillation: Secondary | ICD-10-CM | POA: Diagnosis not present

## 2017-11-23 DIAGNOSIS — I428 Other cardiomyopathies: Secondary | ICD-10-CM

## 2017-11-23 DIAGNOSIS — I4821 Permanent atrial fibrillation: Secondary | ICD-10-CM

## 2017-11-23 NOTE — Progress Notes (Signed)
Follow-up Outpatient Visit Date: 11/23/2017  Primary Care Provider: Marton Redwood, MD [place] [address]  Chief Complaint: Fatigue and dizziness  HPI:  [name] is a 72 y.o. year-old male with history of NICM, chronic atrial fibrilltation s/p AV node ablation and biventricular pacemaker, HTN, HLD, OSA on CPAP, and GERD, who presents for follow-up of NICM. He was previously followed by Dr. Aundra Dubin, having last been seen 2 years ago. He also follows with Dr. Rayann Heman for his EP issues.  Today, [name] reports feeling tired and dizzy. These symptoms have been present for years and he is concerned that they are related to the valsartan and carvedilol that he is taking. He has stable exertional dyspnea and denies chest pain, palpitations, orthopnea, PND, and edema. He wears CPAP nightly.  --------------------------------------------------------------------------------------------------  Past Medical History:  Diagnosis Date  . AAA (abdominal aortic aneurysm) ([place])   . Arthritis   . Atrial fibrillation ([place])    permanent  . CAD (coronary artery disease)   . Chronic systolic dysfunction of left ventricle    s/p BiV ICD//downgrade to CRT-P (medtronic) on 04-25-2013 by Dr Rayann Heman  . Colon polyps   . Complete heart block (HCC)    s/p AV nodal ablation for afib  . Dysphagia   . Elevated LFTs   . Fatigue   . GERD (gastroesophageal reflux disease)   . Gout   . Hemochromatosis   . Hyperlipidemia   . IFG (impaired fasting glucose)   . Osteoarthritis   . Sleep apnea    Past Surgical History:  Procedure Laterality Date  . AV NODE ABLATION    . BIV PACEMAKER GENERATOR CHANGE OUT  04-25-2013   downgrade of previously implanted CRTD to Medtronic CRTP by Dr Rayann Heman.  Previously implanted LV and 4076 RV leads were used  . CARDIAC DEFIBRILLATOR PLACEMENT  2007, 2010   MDT BiV ICD,  [phone] RV lead, though pace/ sense is a Medtronic model 4076 - 58  lead; downgraded to CRTP on  04-25-2013 by Dr Rayann Heman  . HAND SURGERY     left  . HERNIA REPAIR    . HYDROCELE EXCISION    . IMPLANTABLE CARDIOVERTER DEFIBRILLATOR GENERATOR CHANGE N/A 04/25/2013   Procedure: IMPLANTABLE CARDIOVERTER DEFIBRILLATOR GENERATOR CHANGE;  Surgeon: Thompson Grayer, MD;  Location: [facility] CATH LAB;  Service: Cardiovascular;  Laterality: N/A;  . PACEMAKER INSERTION  2006  . REPLACEMENT TOTAL KNEE  2000, 2011 x2   bilateral    Current Meds  Medication Sig  . carvedilol (COREG) 25 MG tablet Take 12.5 mg by mouth 2 (two) times daily.  . furosemide (LASIX) 20 MG tablet Take 20 mg by mouth daily.  . Rivaroxaban (XARELTO) 20 MG TABS tablet Take 1 tablet (20 mg total) by mouth daily.  . simvastatin (ZOCOR) 40 MG tablet Take 20 mg by mouth every evening.   . valsartan (DIOVAN) 80 MG tablet Take 80 mg by mouth daily.  . [DISCONTINUED] allopurinol (ZYLOPRIM) 300 MG tablet Take 1 tablet by mouth daily   Current Facility-Administered Medications for the 11/23/17 encounter (Office Visit) with Kaslyn Rich[place], Harrell Gave, MD  Medication  . 0.9 %  sodium chloride infusion    Allergies: Lisinopril and Niacin  Social History   Socioeconomic History  . Marital status: Married    Spouse name: Not on file  . Number of children: 2  . Years of education: Not on file  . Highest education level: Not on file  Social Needs  . Emergency planning/management officer  strain: Not on file  . Food insecurity - worry: Not on file  . Food insecurity - inability: Not on file  . Transportation needs - medical: Not on file  . Transportation needs - non-medical: Not on file  Occupational History  . Occupation: Press photographer  Tobacco Use  . Smoking status: Former Smoker    Packs/day: 1.00    Years: 8.00    Pack years: 8.00    Types: Cigarettes    Last attempt to quit: 10/10/1997    Years since quitting: 20.1  . Smokeless tobacco: Never Used  Substance and Sexual Activity  . Alcohol use: Yes    Comment: a 6 pack of beer a week, three glasses of wine a week    . Drug use: No  . Sexual activity: Yes  Other Topics Concern  . Not on file  Social History Narrative  . Not on file    Family History  Problem Relation Age of Onset  . Emphysema Father        smoker  . Arthritis Father   . Bone cancer Maternal Uncle   . Brain cancer Brother        step-brother  . Breast cancer Cousin        maternal cousin  . Colon cancer Neg Hx     Review of Systems: A 12-system review of systems was performed and was negative except as noted in the HPI.  --------------------------------------------------------------------------------------------------  Physical Exam: BP 124/82   Pulse 74   Ht 6\' 5"  (1.956 m)   Wt 259 lb 12.8 oz (117.8 kg)   SpO2 95%   BMI 30.81 kg/m   General:  Overweight man, seated comfortably in the exam room. HEENT: No conjunctival pallor or scleral icterus. Moist mucous membranes.  OP clear. Neck: Supple without lymphadenopathy, thyromegaly, JVD, or HJR. No carotid bruit. Lungs: Normal work of breathing. Clear to auscultation bilaterally without wheezes or crackles. Heart: Regular rate and rhythm without murmurs, rubs, or gallops. Non-displaced PMI. Abd: Bowel sounds present. Soft, NT/ND without hepatosplenomegaly Ext: No lower extremity edema. Radial, PT, and DP pulses are 2+ bilaterally. Skin: Warm and dry without rash.  EKG (11/20/17):  A-fib with biventricular pacing and PVC.  Lab Results  Component Value Date   WBC 8.4 10/23/2015   HGB 13.8 10/23/2015   HCT 40.3 10/23/2015   MCV 96.9 10/23/2015   PLT 241 10/23/2015    Lab Results  Component Value Date   NA 139 10/23/2015   K 4.0 10/23/2015   CL 103 10/23/2015   CO2 26 10/23/2015   BUN 16 10/23/2015   CREATININE 1.15 10/23/2015   GLUCOSE 67 10/23/2015   ALT 20 08/24/2009    Lab Results  Component Value Date   CHOL 120 01/08/2015   HDL 50.70 01/08/2015   LDLCALC 58 01/08/2015   TRIG 55.0 01/08/2015   CHOLHDL 2 01/08/2015     --------------------------------------------------------------------------------------------------  ASSESSMENT AND PLAN: NICM [name] appears euvolemic and well-compensated. Most recent LVEF by echo in 2016 was 50-55%. [name] is concerned about potential side effects of his medications. Given preserved LV function, we have agreed to discontinue valsartan. I will continue carvedilol, given history of a-fib and PVCs (though rate control should not be an issue, given history of AV node ablation). If [name] remains symptomatic at follow-up, we may need to consider repeating an echo to ensure that his LV function has not worsened and consider deescalation of carvedilol (if Dr. Rayann Heman in in agreement  with this).  Permanent atrial fibrillation Patient is s/p AV node ablation and is chronically BiV-paced. He should continue anticoagulation with rivaroxaban.  OSA Continue CPAP.  Follow-up: Return to clinic in 3 months.  Nelva Beigel, MD 11/23/2017 5:01 PM

## 2017-11-23 NOTE — Patient Instructions (Addendum)
Medication Instructions:  STOP Valsartan   -- If you need a refill on your cardiac medications before your next appointment, please call your pharmacy. --  Labwork: None ordered  Testing/Procedures: None ordered  Follow-Up: Your physician wants you to follow-up in: 3 months with Dr. Saunders Revel.    You will receive a reminder letter in the mail two months in advance. If you don't receive a letter, please call our office to schedule the follow-up appointment.  Thank you for choosing [facility]!!    Any Other Special Instructions Will Be Listed Below (If Applicable).

## 2017-12-19 ENCOUNTER — Telehealth: Payer: Self-pay

## 2017-12-19 NOTE — Telephone Encounter (Signed)
Referred to ICM clinic by Dr Rayann Heman.  Attemped ICM call and left message with direct number to return call.

## 2017-12-22 NOTE — Telephone Encounter (Signed)
Returned patient call and provided ICM intro. He agreed to monthly ICM follow up and discussed costs regarding insurance payment.  He agreed to send remote transmission on 01/08/2018.  He has ICM number and reviewed fluid symptoms to call about if needed.  He weighs daily.  Briefly discussed low salt diet.  He does not salt foods but does not check food labels for salt content.

## 2018-01-08 ENCOUNTER — Telehealth: Payer: Self-pay | Admitting: Cardiology

## 2018-01-08 ENCOUNTER — Ambulatory Visit (INDEPENDENT_AMBULATORY_CARE_PROVIDER_SITE_OTHER): Payer: Medicare Other | Admitting: *Deleted

## 2018-01-08 DIAGNOSIS — I428 Other cardiomyopathies: Secondary | ICD-10-CM | POA: Diagnosis not present

## 2018-01-08 DIAGNOSIS — Z950 Presence of cardiac pacemaker: Secondary | ICD-10-CM | POA: Diagnosis not present

## 2018-01-08 DIAGNOSIS — I5032 Chronic diastolic (congestive) heart failure: Secondary | ICD-10-CM | POA: Diagnosis not present

## 2018-01-08 NOTE — Telephone Encounter (Signed)
Spoke with pt and reminded pt of remote transmission that is due today. Pt verbalized understanding.   

## 2018-01-09 ENCOUNTER — Telehealth: Payer: Self-pay

## 2018-01-09 ENCOUNTER — Encounter: Payer: Self-pay | Admitting: Cardiology

## 2018-01-09 NOTE — Progress Notes (Signed)
EPIC Encounter for ICM Monitoring  Patient Name: [name] is a 72 y.o. male Date: 01/09/2018 Primary Care Physican: Marton Redwood, MD Primary Cardiologist: End Electrophysiologist: Allred Dry Weight: unknown Bi-V Pacing:   98.3%    Clinical Status (20-Nov-2017 to 09-Jan-2018) AT/AF 1 episode  Time in AT/AF 24.0 hr/day (100.0%) Observations (1) (20-Nov-2017 to 09-Jan-2018)  50 days with more than 6 hr AT/AF.     1st ICM Remote Transmission.  Attempted call to patient and unable to reach.  Left detailed message regarding transmission.  Transmission reviewed.    Thoracic impedance slightly abnormal suggesting fluid accumulation for past 2 days.  Prescribed dosage: Furosemide  20 mg 1 tablet daily.   Recommendations: Left voice mail with ICM number and encouraged to call if experiencing any fluid symptoms.  Follow-up plan: ICM clinic phone appointment on 01/15/2018 to recheck fluid levels.    Copy of ICM check sent to Dr. Rayann Heman and Dr. Saunders Revel.   3 month ICM trend: 01/09/2018    1 Year ICM trend:       Rosalene Billings, RN 01/09/2018 4:52 PM

## 2018-01-09 NOTE — Progress Notes (Signed)
Remote pacemaker transmission.   

## 2018-01-09 NOTE — Telephone Encounter (Signed)
Remote ICM transmission received.  Attempted call to patient and left detailed message per DPR to return call regarding transmission. 

## 2018-01-10 LAB — CUP PACEART REMOTE DEVICE CHECK
Brady Statistic AP VS Percent: 0 %
Brady Statistic AS VP Percent: 97.49 %
Brady Statistic AS VS Percent: 2.51 %
Brady Statistic RA Percent Paced: 0 %
Implantable Lead Implant Date: 20061130
Implantable Lead Implant Date: 20061130
Implantable Lead Location: 753858
Implantable Lead Location: 753859
Implantable Lead Location: 753860
Implantable Lead Model: 5076
Implantable Pulse Generator Implant Date: 20140717
Lead Channel Impedance Value: 513 Ohm
Lead Channel Impedance Value: 513 Ohm
Lead Channel Impedance Value: 608 Ohm
Lead Channel Impedance Value: 703 Ohm
Lead Channel Pacing Threshold Amplitude: 0.375 V
Lead Channel Sensing Intrinsic Amplitude: 0.75 mV
Lead Channel Sensing Intrinsic Amplitude: 0.75 mV
Lead Channel Setting Pacing Amplitude: 2 V
Lead Channel Setting Pacing Pulse Width: 0.4 ms
MDC IDC LEAD IMPLANT DT: 20061130
MDC IDC LEAD IMPLANT DT: 20061130
MDC IDC LEAD LOCATION: 753860
MDC IDC MSMT BATTERY REMAINING LONGEVITY: 31 mo
MDC IDC MSMT BATTERY VOLTAGE: 2.95 V
MDC IDC MSMT LEADCHNL LV IMPEDANCE VALUE: 361 Ohm
MDC IDC MSMT LEADCHNL LV IMPEDANCE VALUE: 475 Ohm
MDC IDC MSMT LEADCHNL LV IMPEDANCE VALUE: 760 Ohm
MDC IDC MSMT LEADCHNL RA IMPEDANCE VALUE: 399 Ohm
MDC IDC MSMT LEADCHNL RV IMPEDANCE VALUE: 570 Ohm
MDC IDC MSMT LEADCHNL RV PACING THRESHOLD PULSEWIDTH: 0.4 ms
MDC IDC SESS DTM: 20190402050225
MDC IDC SET LEADCHNL LV PACING AMPLITUDE: 1.25 V
MDC IDC SET LEADCHNL LV PACING PULSEWIDTH: 0.4 ms
MDC IDC SET LEADCHNL RV SENSING SENSITIVITY: 4 mV
MDC IDC STAT BRADY AP VP PERCENT: 0 %
MDC IDC STAT BRADY RV PERCENT PACED: 98.29 %

## 2018-01-15 ENCOUNTER — Telehealth: Payer: Self-pay | Admitting: Cardiology

## 2018-01-15 ENCOUNTER — Ambulatory Visit (INDEPENDENT_AMBULATORY_CARE_PROVIDER_SITE_OTHER): Payer: Self-pay

## 2018-01-15 DIAGNOSIS — I5032 Chronic diastolic (congestive) heart failure: Secondary | ICD-10-CM

## 2018-01-15 DIAGNOSIS — Z950 Presence of cardiac pacemaker: Secondary | ICD-10-CM

## 2018-01-15 NOTE — Telephone Encounter (Signed)
Spoke with pt and reminded pt of remote transmission that is due today. Pt verbalized understanding.   

## 2018-01-16 ENCOUNTER — Telehealth: Payer: Self-pay

## 2018-01-16 NOTE — Progress Notes (Signed)
EPIC Encounter for ICM Monitoring  Patient Name: [name] is a 72 y.o. male Date: 01/16/2018 Primary Care Physican: Marton Redwood, MD Primary Cardiologist: End Electrophysiologist: Allred Dry Weight:    unknown Bi-V Pacing:   97.8%  Clinical Status (09-Jan-2018 to 16-Jan-2018) V. Pacing 97.8% AT/AF 1 episode  Time in AT/AF 24.0 hr/day (100.0%)  Observations (1) (09-Jan-2018 to 16-Jan-2018)  7 days with more than 6 hr AT/AF.        Attempted call to patient and unable to reach.  Left message to return call regarding transmission.  Transmission reviewed.    Thoracic impedance abnormal suggesting fluid accumulation since 01/11/2018.  Prescribed dosage: Furosemide  20 mg 1 tablet daily.   Recommendations: NONE - Unable to reach.  Follow-up plan: ICM clinic phone appointment on 01/22/2018 to recheck fluid levels.  Office appointment scheduled 02/12/2018 with Dr. Saunders Revel.  Copy of ICM check sent to Dr. Saunders Revel and Dr. Rayann Heman.   3 month ICM trend: 01/16/2018    1 Year ICM trend:       Rosalene Billings, RN 01/16/2018 9:07 AM

## 2018-01-16 NOTE — Telephone Encounter (Signed)
Remote ICM transmission received.  Attempted call to patient and left detailed message per DPR to return call regarding transmission. 

## 2018-01-22 ENCOUNTER — Ambulatory Visit (INDEPENDENT_AMBULATORY_CARE_PROVIDER_SITE_OTHER): Payer: Self-pay

## 2018-01-22 ENCOUNTER — Telehealth: Payer: Self-pay

## 2018-01-22 ENCOUNTER — Telehealth: Payer: Self-pay | Admitting: Cardiology

## 2018-01-22 DIAGNOSIS — Z950 Presence of cardiac pacemaker: Secondary | ICD-10-CM

## 2018-01-22 DIAGNOSIS — I5032 Chronic diastolic (congestive) heart failure: Secondary | ICD-10-CM

## 2018-01-22 NOTE — Telephone Encounter (Signed)
Spoke with pt and reminded pt of remote transmission that is due today. Pt verbalized understanding.   

## 2018-01-22 NOTE — Telephone Encounter (Signed)
Attempted ICM call to patient.  No answer and left message with direct number to call back.

## 2018-01-23 ENCOUNTER — Telehealth: Payer: Self-pay

## 2018-01-23 NOTE — Progress Notes (Signed)
EPIC Encounter for ICM Monitoring  Patient Name: [name] is a 72 y.o. male Date: 01/23/2018 Primary Care Physican: Marton Redwood, MD Primary Cardiologist:End Electrophysiologist:Allred Dry Weight:unknown Bi-V Pacing:97.8%        Attempted call to patient and unable to reach.  detailed message regarding transmission.  Transmission reviewed.    Thoracic impedance returned to normal since 01/15/2018 transmission.  Prescribed dosage: Furosemide20 mg 1 tablet daily.  Recommendations: Left voice mail with ICM number and encouraged to call if experiencing any fluid symptoms.  Follow-up plan: ICM clinic phone appointment on 02/23/2018.  Office appointment scheduled 02/12/2018 with Dr. Saunders Revel.  Copy of ICM check sent to Dr. Rayann Heman.   3 month ICM trend: 01/22/2018    1 Year ICM trend:       Rosalene Billings, RN 01/23/2018 10:01 AM

## 2018-01-23 NOTE — Telephone Encounter (Signed)
Remote ICM transmission received.  Attempted call to patient and left detailed message per DPR regarding transmission and next ICM scheduled for 02/23/2018.  Advised to return call for any fluid symptoms or questions.

## 2018-02-12 ENCOUNTER — Encounter: Payer: Self-pay | Admitting: Internal Medicine

## 2018-02-12 ENCOUNTER — Ambulatory Visit (INDEPENDENT_AMBULATORY_CARE_PROVIDER_SITE_OTHER): Payer: Medicare Other | Admitting: Internal Medicine

## 2018-02-12 VITALS — BP 124/86 | HR 67 | Ht 77.0 in | Wt 263.2 lb

## 2018-02-12 DIAGNOSIS — R0609 Other forms of dyspnea: Secondary | ICD-10-CM | POA: Diagnosis not present

## 2018-02-12 DIAGNOSIS — I482 Chronic atrial fibrillation: Secondary | ICD-10-CM

## 2018-02-12 DIAGNOSIS — I4821 Permanent atrial fibrillation: Secondary | ICD-10-CM

## 2018-02-12 DIAGNOSIS — I428 Other cardiomyopathies: Secondary | ICD-10-CM | POA: Diagnosis not present

## 2018-02-12 NOTE — Patient Instructions (Addendum)
Medication Instructions:  Your physician recommends that you continue on your current medications as directed. Please refer to the Current Medication list given to you today.  -- If you need a refill on your cardiac medications before your next appointment, please call your pharmacy. --  Labwork: None ordered  Testing/Procedures: Your physician has requested that you have an echocardiogram. Echocardiography is a painless test that uses sound waves to create images of your heart. It provides your doctor with information about the size and shape of your heart and how well your heart's chambers and valves are working. This procedure takes approximately one hour. There are no restrictions for this procedure.   Follow-Up: Your physician wants you to follow-up in: 4 months with Dr. Saunders Revel.     Thank you for choosing [facility]!!    Any Other Special Instructions Will Be Listed Below (If Applicable).

## 2018-02-12 NOTE — Progress Notes (Signed)
Follow-up Outpatient Visit Date: 02/12/2018  Primary Care Provider: Marton Redwood, MD [place] [address]  Chief Complaint: Follow-up nonischemic cardiomyopathy and atrial fibrillation  HPI:  [name] is a 72 y.o. year-old male with history of NICM, chronic atrial fibrilltation s/p AV node ablation and biventricular pacemaker, HTN, HLD, OSA on CPAP, and GERD, who presents for follow-up of nonischemic cardiomyopathy.  I last saw [name] in February, at which time he reported fatigue and dizziness.  He also noted stable exertional dyspnea.  At that time, we agreed to decrease discontinue valsartan.    Today, [name] reports that he is feeling about the same as at our last visit.  He has continued exertional dyspnea that did not improve after stopping valsartan.  He is still on the same dose of carvedilol.  He denies chest pain, palpitations, lightheadedness, orthopnea, PND, and edema.  He notes that he sometimes feels drained in the afternoons despite sleeping relatively well at night and using CPAP as much as possible.  He has been checking his blood pressure at home periodically and notes that it was 117/76 earlier today.  --------------------------------------------------------------------------------------------------  Past Medical History:  Diagnosis Date  . AAA (abdominal aortic aneurysm) ([place])   . Arthritis   . Atrial fibrillation ([place])    permanent  . CAD (coronary artery disease)   . Chronic systolic dysfunction of left ventricle    s/p BiV ICD//downgrade to CRT-P (medtronic) on 04-25-2013 by Dr Rayann Heman  . Colon polyps   . Complete heart block ([facility])    s/p AV nodal ablation for afib  . Dysphagia   . Elevated LFTs   . Fatigue   . GERD (gastroesophageal reflux disease)   . Gout   . Hemochromatosis   . Hyperlipidemia   . IFG (impaired fasting glucose)   . Osteoarthritis   . Sleep apnea    Past Surgical History:  Procedure Laterality Date  . AV NODE  ABLATION    . BIV PACEMAKER GENERATOR CHANGE OUT  04-25-2013   downgrade of previously implanted CRTD to Medtronic CRTP by Dr Rayann Heman.  Previously implanted LV and 4076 RV leads were used  . CARDIAC DEFIBRILLATOR PLACEMENT  2007, 2010   MDT BiV ICD,  [id number] RV lead, though pace/ sense is a Medtronic model 4076 - 58  lead; downgraded to CRTP on 04-25-2013 by Dr Rayann Heman  . HAND SURGERY     left  . HERNIA REPAIR    . HYDROCELE EXCISION    . IMPLANTABLE CARDIOVERTER DEFIBRILLATOR GENERATOR CHANGE N/A 04/25/2013   Procedure: IMPLANTABLE CARDIOVERTER DEFIBRILLATOR GENERATOR CHANGE;  Surgeon: Thompson Grayer, MD;  Location: [facility] C[facility] LAB;  Service: Cardiovascular;  Laterality: N/A;  . PACEMAKER INSERTION  2006  . REPLACEMENT TOTAL KNEE  2000, 2011 x2   bilateral    Current Meds  Medication Sig  . carvedilol (COREG) 25 MG tablet Take 12.5 mg by mouth 2 (two) times daily.  . furosemide (LASIX) 20 MG tablet Take 20 mg by mouth 3 (three) times a week.  . Rivaroxaban (XARELTO) 20 MG TABS tablet Take 1 tablet (20 mg total) by mouth daily.  . simvastatin (ZOCOR) 40 MG tablet Take 20 mg by mouth every evening.    Current Facility-Administered Medications for the 02/12/18 encounter (Office Visit) with Hibba Schram, Harrell Gave, MD  Medication  . 0.9 %  sodium chloride infusion    Allergies: Lisinopril and Niacin  Social History   Tobacco Use  . Smoking status: Former Smoker  Packs/day: 1.00    Years: 8.00    Pack years: 8.00    Types: Cigarettes    Last attempt to quit: 10/10/1997    Years since quitting: 20.3  . Smokeless tobacco: Never Used  Substance Use Topics  . Alcohol use: Yes    Comment: a 6 pack of beer a week, three glasses of wine a week  . Drug use: No    Family History  Problem Relation Age of Onset  . Emphysema Father        smoker  . Arthritis Father   . Bone cancer Maternal Uncle   . Brain cancer Brother        step-brother  . Breast cancer Cousin        maternal cousin  . Colon  cancer Neg Hx     Review of Systems: A 12-system review of systems was performed and was negative except as noted in the HPI.  --------------------------------------------------------------------------------------------------  Physical Exam: BP 124/86   Pulse 67   Ht 6\' 5"  (1.956 m)   Wt 263 lb 3.2 oz (119.4 kg)   BMI 31.21 kg/m   General: NAD. HEENT: No conjunctival pallor or scleral icterus. Moist mucous membranes.  OP clear. Neck: Supple without lymphadenopathy, thyromegaly, JVD, or HJR. Lungs: Normal work of breathing. Clear to auscultation bilaterally without wheezes or crackles. Heart: Regular rate and rhythm without murmurs, rubs, or gallops. Non-displaced PMI. Abd: Bowel sounds present. Soft, NT/ND without hepatosplenomegaly Ext: No lower extremity edema. Radial, PT, and DP pulses are 2+ bilaterally. Skin: Warm and dry without rash.  EKG: Atrial fibrillation with biventricular pacing.  Lab Results  Component Value Date   WBC 8.4 10/23/2015   HGB 13.8 10/23/2015   HCT 40.3 10/23/2015   MCV 96.9 10/23/2015   PLT 241 10/23/2015    Lab Results  Component Value Date   NA 139 10/23/2015   K 4.0 10/23/2015   CL 103 10/23/2015   CO2 26 10/23/2015   BUN 16 10/23/2015   CREATININE 1.15 10/23/2015   GLUCOSE 67 10/23/2015   ALT 20 08/24/2009    Lab Results  Component Value Date   CHOL 120 01/08/2015   HDL 50.70 01/08/2015   LDLCALC 58 01/08/2015   TRIG 55.0 01/08/2015   CHOLHDL 2 01/08/2015    --------------------------------------------------------------------------------------------------  ASSESSMENT AND PLAN: Nonischemic cardiomyopathy with chronic dyspnea on exertion Symptoms did not improve with discontinuation of valsartan.  It is possible some of the fatigue and exertional dyspnea could be related to carvedilol, though this has been a long-standing medication for [name].  It has been since 2016 that [name] had an echocardiogram.  We have agreed to  repeat an echo to assess for worsening LV systolic function or other structural abnormalities that could be contributing to this.  I would favor adding back an ARB in the future, particularly if LVEF has declined.  We will consider decreasing carvedilol after the echocardiogram to see if this helps relieve some of his dyspnea and fatigue.  Of note, thoracic impedance was elevated on device checks last month, though this has since returned to normal.  [name] appears euvolemic on exam; we will continue with furosemide 20 mg 3 days a week.Marland Kitchen  Permanent atrial fibrillation Atrial fibrillation persists on EKG today with chronic biventricular pacing.  We will continue current dose of carvedilol as well as indefinite anticoagulation with apixaban.  I will defer ongoing atrial fibrillation and device management to Dr. Rayann Heman and the device clinic.  Follow-up: Return to clinic in 4 months.  Nelva Frier, MD 02/13/2018 8:56 PM

## 2018-02-13 ENCOUNTER — Encounter: Payer: Self-pay | Admitting: Internal Medicine

## 2018-02-16 ENCOUNTER — Other Ambulatory Visit: Payer: Self-pay

## 2018-02-16 ENCOUNTER — Ambulatory Visit (HOSPITAL_COMMUNITY): Payer: Medicare Other | Attending: Cardiology

## 2018-02-16 ENCOUNTER — Other Ambulatory Visit (HOSPITAL_COMMUNITY): Payer: Medicare Other

## 2018-02-16 DIAGNOSIS — G4733 Obstructive sleep apnea (adult) (pediatric): Secondary | ICD-10-CM | POA: Diagnosis not present

## 2018-02-16 DIAGNOSIS — I428 Other cardiomyopathies: Secondary | ICD-10-CM | POA: Diagnosis not present

## 2018-02-16 DIAGNOSIS — I482 Chronic atrial fibrillation: Secondary | ICD-10-CM | POA: Diagnosis not present

## 2018-02-16 DIAGNOSIS — I4821 Permanent atrial fibrillation: Secondary | ICD-10-CM

## 2018-02-16 DIAGNOSIS — I272 Pulmonary hypertension, unspecified: Secondary | ICD-10-CM | POA: Insufficient documentation

## 2018-02-16 DIAGNOSIS — E785 Hyperlipidemia, unspecified: Secondary | ICD-10-CM | POA: Diagnosis not present

## 2018-02-16 DIAGNOSIS — I119 Hypertensive heart disease without heart failure: Secondary | ICD-10-CM | POA: Insufficient documentation

## 2018-02-16 DIAGNOSIS — R0609 Other forms of dyspnea: Secondary | ICD-10-CM | POA: Diagnosis not present

## 2018-02-16 DIAGNOSIS — I4891 Unspecified atrial fibrillation: Secondary | ICD-10-CM | POA: Insufficient documentation

## 2018-02-16 MED ORDER — PERFLUTREN LIPID MICROSPHERE
1.0000 mL | INTRAVENOUS | Status: AC | PRN
  Administered 2018-02-16: 2 mL via INTRAVENOUS

## 2018-02-19 ENCOUNTER — Other Ambulatory Visit: Payer: Self-pay

## 2018-02-19 DIAGNOSIS — I10 Essential (primary) hypertension: Secondary | ICD-10-CM

## 2018-02-19 MED ORDER — FUROSEMIDE 20 MG PO TABS: 20.0000 mg | ORAL_TABLET | Freq: Every day | ORAL | 3 refills | Status: AC

## 2018-02-23 ENCOUNTER — Telehealth: Payer: Self-pay

## 2018-02-23 NOTE — Telephone Encounter (Signed)
Attempted ICM call and no answer.  

## 2018-03-01 MED FILL — Perflutren Lipid Microsphere IV Susp 6.52 MG/ML: INTRAVENOUS | Qty: 2 | Status: AC

## 2018-03-06 ENCOUNTER — Other Ambulatory Visit: Payer: Medicare Other | Admitting: *Deleted

## 2018-03-06 DIAGNOSIS — I10 Essential (primary) hypertension: Secondary | ICD-10-CM

## 2018-03-07 LAB — BASIC METABOLIC PANEL
BUN / CREAT RATIO: 17 (ref 10–24)
BUN: 17 mg/dL (ref 8–27)
CALCIUM: 8.9 mg/dL (ref 8.6–10.2)
CO2: 24 mmol/L (ref 20–29)
CREATININE: 1.01 mg/dL (ref 0.76–1.27)
Chloride: 107 mmol/L — ABNORMAL HIGH (ref 96–106)
GFR calc non Af Amer: 75 mL/min/{1.73_m2} (ref >59)
GFR, EST AFRICAN AMERICAN: 87 mL/min/{1.73_m2} (ref >59)
Glucose: 78 mg/dL (ref 65–99)
Potassium: 4.3 mmol/L (ref 3.5–5.2)
Sodium: 145 mmol/L — ABNORMAL HIGH (ref 134–144)

## 2018-04-09 ENCOUNTER — Ambulatory Visit (INDEPENDENT_AMBULATORY_CARE_PROVIDER_SITE_OTHER): Payer: Medicare Other | Admitting: *Deleted

## 2018-04-09 ENCOUNTER — Telehealth: Payer: Self-pay | Admitting: Cardiology

## 2018-04-09 DIAGNOSIS — I5032 Chronic diastolic (congestive) heart failure: Secondary | ICD-10-CM

## 2018-04-09 DIAGNOSIS — Z950 Presence of cardiac pacemaker: Secondary | ICD-10-CM | POA: Diagnosis not present

## 2018-04-09 DIAGNOSIS — I428 Other cardiomyopathies: Secondary | ICD-10-CM

## 2018-04-09 NOTE — Telephone Encounter (Signed)
Spoke with pt and reminded pt of remote transmission that is due today. Pt verbalized understanding.   

## 2018-04-10 NOTE — Progress Notes (Signed)
Remote pacemaker transmission.   

## 2018-04-13 NOTE — Progress Notes (Signed)
EPIC Encounter for ICM Monitoring  Patient Name: DEONTRAY HUNNICUTT is a 72 y.o. male Date: 04/13/2018 Primary Care Physican: Marton Redwood, MD Primary Cardiologist:End Electrophysiologist:Allred Dry Weight:unknown Bi-V Pacing:97.8%       Attempted call to patient and unable to reach. Left detailed message.  Transmission reviewed.    Thoracic impedance normal.  Prescribed dosage: Furosemide20 mg 1 tablet daily.  Recommendations: Left voice mail with ICM number and encouraged to call if experiencing any fluid symptoms.  Follow-up plan: ICM clinic phone appointment on 05/14/2018.  Office appointment scheduled 07/05/2018 with Dr.End.  Copy of ICM check sent to Dr. Rayann Heman.    3 month ICM trend: 04/10/2018    1 Year ICM trend:       Rosalene Billings, RN 04/13/2018 5:04 PM

## 2018-04-14 LAB — CUP PACEART REMOTE DEVICE CHECK
Battery Remaining Longevity: 27 mo
Battery Voltage: 2.94 V
Brady Statistic AS VP Percent: 97.34 %
Date Time Interrogation Session: 20190702055059
Implantable Lead Implant Date: 20061130
Implantable Lead Location: 753858
Implantable Lead Model: 6949
Lead Channel Impedance Value: 361 Ohm
Lead Channel Impedance Value: 570 Ohm
Lead Channel Impedance Value: 608 Ohm
Lead Channel Impedance Value: 627 Ohm
Lead Channel Sensing Intrinsic Amplitude: 1 mV
Lead Channel Setting Pacing Pulse Width: 0.4 ms
Lead Channel Setting Sensing Sensitivity: 4 mV
MDC IDC LEAD IMPLANT DT: 20061130
MDC IDC LEAD IMPLANT DT: 20061130
MDC IDC LEAD IMPLANT DT: 20061130
MDC IDC LEAD LOCATION: 753859
MDC IDC LEAD LOCATION: 753860
MDC IDC LEAD LOCATION: 753860
MDC IDC MSMT LEADCHNL LV IMPEDANCE VALUE: 475 Ohm
MDC IDC MSMT LEADCHNL LV IMPEDANCE VALUE: 703 Ohm
MDC IDC MSMT LEADCHNL LV IMPEDANCE VALUE: 760 Ohm
MDC IDC MSMT LEADCHNL RA IMPEDANCE VALUE: 418 Ohm
MDC IDC MSMT LEADCHNL RA IMPEDANCE VALUE: 513 Ohm
MDC IDC MSMT LEADCHNL RA SENSING INTR AMPL: 1 mV
MDC IDC MSMT LEADCHNL RV PACING THRESHOLD AMPLITUDE: 0.375 V
MDC IDC MSMT LEADCHNL RV PACING THRESHOLD PULSEWIDTH: 0.4 ms
MDC IDC PG IMPLANT DT: 20140717
MDC IDC SET LEADCHNL LV PACING AMPLITUDE: 1.25 V
MDC IDC SET LEADCHNL LV PACING PULSEWIDTH: 0.4 ms
MDC IDC SET LEADCHNL RV PACING AMPLITUDE: 2 V
MDC IDC STAT BRADY AP VP PERCENT: 0 %
MDC IDC STAT BRADY AP VS PERCENT: 0 %
MDC IDC STAT BRADY AS VS PERCENT: 2.66 %
MDC IDC STAT BRADY RA PERCENT PACED: 0 %
MDC IDC STAT BRADY RV PERCENT PACED: 98.11 %

## 2018-05-14 ENCOUNTER — Telehealth: Payer: Self-pay

## 2018-05-14 ENCOUNTER — Ambulatory Visit (INDEPENDENT_AMBULATORY_CARE_PROVIDER_SITE_OTHER): Payer: Medicare Other

## 2018-05-14 DIAGNOSIS — I5032 Chronic diastolic (congestive) heart failure: Secondary | ICD-10-CM | POA: Diagnosis not present

## 2018-05-14 DIAGNOSIS — Z950 Presence of cardiac pacemaker: Secondary | ICD-10-CM | POA: Diagnosis not present

## 2018-05-14 NOTE — Telephone Encounter (Signed)
Attempted ICM call to patient to request to send remote transmission.  No answer and no message.  Transmission rescheduled for 06/04/2018

## 2018-05-15 ENCOUNTER — Telehealth: Payer: Self-pay

## 2018-05-15 NOTE — Telephone Encounter (Signed)
Remote ICM transmission received.  Attempted call to patient and left detailed message, per DPR, regarding transmission and next ICM scheduled for 05/24/2018.  Advised to return call for any fluid symptoms or questions.

## 2018-05-15 NOTE — Progress Notes (Signed)
EPIC Encounter for ICM Monitoring  Patient Name: [name] is a 72 y.o. male Date: 05/15/2018 Primary Care Physican: Marton Redwood, MD Primary Cardiologist:End Electrophysiologist:Allred Dry Weight:unknown Bi-V Pacing:98.7%  Clinical Status (10-Apr-2018 to 15-May-2018) Time in AT/AF 24.0 hr/day (100.0%) AT/AF 1 Episode  Longest 61 month Observations (1) (10-Apr-2018 to 15-May-2018) 35 days with more than 6 hr AT/AF.       Attempted call to patient and unable to reach.  Left detailed message, per DPR, regarding transmission.  Transmission reviewed.    Thoracic impedance abnormal suggesting fluid accumulation starting 04/28/2018.  Prescribed dosage: Furosemide20 mg 1 tablet daily.  Labs: 03/06/2018 Creatinine 1.1mBUN 17, Potassium 4.3, Sodium 145, EGFR 75-87  Recommendations:  NONE - Unable to reach.  If patient is reached will advise to take Furosemide 20 mg 2 tablets daily x 3 days and then return to 1 tablet daily.   Follow-up plan: ICM clinic phone appointment on 05/24/2018 to recheck fluid levels.   Office appointment scheduled 07/05/2018 with Dr. ESaunders Revel    Copy of ICM check sent to Dr. ARayann Hemanand Dr End.   3 month ICM trend: 05/15/2018    1 Year ICM trend:       LRosalene Billings RN 05/15/2018 1:25 PM

## 2018-05-21 ENCOUNTER — Ambulatory Visit (HOSPITAL_COMMUNITY)
Admission: RE | Admit: 2018-05-21 | Discharge: 2018-05-21 | Disposition: A | Discharge status: Home / Self Care | Payer: Medicare Other | Source: Ambulatory Visit | Attending: Surgery | Admitting: Surgery

## 2018-05-21 ENCOUNTER — Other Ambulatory Visit (HOSPITAL_COMMUNITY): Payer: Self-pay | Admitting: Internal Medicine

## 2018-05-21 DIAGNOSIS — I714 Abdominal aortic aneurysm, without rupture, unspecified: Secondary | ICD-10-CM

## 2018-05-24 ENCOUNTER — Telehealth: Payer: Self-pay | Admitting: Cardiology

## 2018-05-24 NOTE — Telephone Encounter (Signed)
Spoke with pt and reminded pt of remote transmission that is due today. Pt verbalized understanding.   

## 2018-05-31 ENCOUNTER — Encounter (HOSPITAL_BASED_OUTPATIENT_CLINIC_OR_DEPARTMENT_OTHER): Payer: Self-pay | Admitting: Emergency Medicine

## 2018-05-31 ENCOUNTER — Other Ambulatory Visit: Payer: Self-pay

## 2018-05-31 ENCOUNTER — Emergency Department (HOSPITAL_BASED_OUTPATIENT_CLINIC_OR_DEPARTMENT_OTHER): Payer: Medicare Other

## 2018-05-31 ENCOUNTER — Emergency Department (HOSPITAL_BASED_OUTPATIENT_CLINIC_OR_DEPARTMENT_OTHER)
Admission: EM | Admit: 2018-05-31 | Discharge: 2018-05-31 | Disposition: A | Discharge status: Home / Self Care | Payer: Medicare Other | Attending: Emergency Medicine | Admitting: Emergency Medicine

## 2018-05-31 DIAGNOSIS — I5032 Chronic diastolic (congestive) heart failure: Secondary | ICD-10-CM | POA: Insufficient documentation

## 2018-05-31 DIAGNOSIS — Z96653 Presence of artificial knee joint, bilateral: Secondary | ICD-10-CM | POA: Insufficient documentation

## 2018-05-31 DIAGNOSIS — Y92094 Garage of other non-institutional residence as the place of occurrence of the external cause: Secondary | ICD-10-CM | POA: Insufficient documentation

## 2018-05-31 DIAGNOSIS — I110 Hypertensive heart disease with heart failure: Secondary | ICD-10-CM | POA: Insufficient documentation

## 2018-05-31 DIAGNOSIS — I2510 Atherosclerotic heart disease of native coronary artery without angina pectoris: Secondary | ICD-10-CM | POA: Insufficient documentation

## 2018-05-31 DIAGNOSIS — Y93H9 Activity, other involving exterior property and land maintenance, building and construction: Secondary | ICD-10-CM | POA: Insufficient documentation

## 2018-05-31 DIAGNOSIS — S61210A Laceration without foreign body of right index finger without damage to nail, initial encounter: Secondary | ICD-10-CM | POA: Insufficient documentation

## 2018-05-31 DIAGNOSIS — Z95810 Presence of automatic (implantable) cardiac defibrillator: Secondary | ICD-10-CM | POA: Diagnosis not present

## 2018-05-31 DIAGNOSIS — Z7901 Long term (current) use of anticoagulants: Secondary | ICD-10-CM | POA: Diagnosis not present

## 2018-05-31 DIAGNOSIS — Y999 Unspecified external cause status: Secondary | ICD-10-CM | POA: Insufficient documentation

## 2018-05-31 DIAGNOSIS — Z23 Encounter for immunization: Secondary | ICD-10-CM | POA: Insufficient documentation

## 2018-05-31 DIAGNOSIS — W268XXA Contact with other sharp object(s), not elsewhere classified, initial encounter: Secondary | ICD-10-CM | POA: Diagnosis not present

## 2018-05-31 DIAGNOSIS — Z87891 Personal history of nicotine dependence: Secondary | ICD-10-CM | POA: Diagnosis not present

## 2018-05-31 DIAGNOSIS — Z79899 Other long term (current) drug therapy: Secondary | ICD-10-CM | POA: Insufficient documentation

## 2018-05-31 MED ORDER — BACITRACIN ZINC 500 UNIT/GM EX OINT
TOPICAL_OINTMENT | Freq: Once | CUTANEOUS | Status: AC
  Administered 2018-05-31: 11:00:00 via TOPICAL

## 2018-05-31 MED ORDER — LIDOCAINE HCL (PF) 1 % IJ SOLN
10.0000 mL | Freq: Once | INTRAMUSCULAR | Status: AC
  Administered 2018-05-31: 10 mL via INTRADERMAL
  Filled 2018-05-31: qty 10

## 2018-05-31 MED ORDER — TETANUS-DIPHTH-ACELL PERTUSSIS 5-2.5-18.5 LF-MCG/0.5 IM SUSP
0.5000 mL | Freq: Once | INTRAMUSCULAR | Status: AC
  Administered 2018-05-31: 0.5 mL via INTRAMUSCULAR
  Filled 2018-05-31: qty 0.5

## 2018-05-31 NOTE — ED Provider Notes (Signed)
[facility] EMERGENCY DEPARTMENT Provider Note   CSN: [id number] Arrival date & time: 05/31/18  1015     History   Chief Complaint Chief Complaint  Patient presents with  . Extremity Laceration    HPI TARVIS BLOSSOM is a 72 y.o. male who presents for evaluation of right index and thumb laceration that occurred just prior to ED arrival.  Patient reports he was working on the garage when the spring got loose, causing the parts to fly off and cut his index and thumb.  Patient states that he does not know when his last tetanus shot was.  He denies any numbness/weakness.  The history is provided by the patient.    Past Medical History:  Diagnosis Date  . AAA (abdominal aortic aneurysm) ([place])   . Arthritis   . Atrial fibrillation ([place])    permanent  . CAD (coronary artery disease)   . Chronic systolic dysfunction of left ventricle    s/p BiV ICD//downgrade to CRT-P (medtronic) on 04-25-2013 by Dr Rayann Heman  . Colon polyps   . Complete heart block ([facility])    s/p AV nodal ablation for afib  . Dysphagia   . Elevated LFTs   . Fatigue   . GERD (gastroesophageal reflux disease)   . Gout   . Hemochromatosis   . Hyperlipidemia   . IFG (impaired fasting glucose)   . Osteoarthritis   . Sleep apnea     Patient Active Problem List   Diagnosis Date Noted  . ICD (implantable cardioverter-defibrillator) battery depletion 04/17/2013  . Insomnia 03/14/2013  . Dyspnea 03/14/2013  . Long term (current) use of anticoagulants 09/14/2011  . Chronic diastolic heart failure ([place]) 09/05/2011  . NICM (nonischemic cardiomyopathy) ([place]) 09/05/2011  . OSA (obstructive sleep apnea) 05/13/2011  . AV BLOCK, COMPLETE 08/14/2009  . Atrial fibrillation ([place]) 08/14/2009  . Other primary cardiomyopathies 04/30/2009  . OTHER DYSPHAGIA 01/13/2009  . HYPERCHOLESTEROLEMIA 01/08/2009  . HYPERTENSION 01/08/2009  . ABNORMAL HEART RHYTHMS 01/08/2009  . GERD 01/08/2009  . COLONIC POLYPS, ADENOMATOUS,  HX OF 01/08/2009    Past Surgical History:  Procedure Laterality Date  . AV NODE ABLATION    . BIV PACEMAKER GENERATOR CHANGE OUT  04-25-2013   downgrade of previously implanted CRTD to Medtronic CRTP by Dr Rayann Heman.  Previously implanted LV and 4076 RV leads were used  . CARDIAC DEFIBRILLATOR PLACEMENT  2007, 2010   MDT BiV ICD,  [phone] RV lead, though pace/ sense is a Medtronic model 4076 - 58  lead; downgraded to CRTP on 04-25-2013 by Dr Rayann Heman  . HAND SURGERY     left  . HERNIA REPAIR    . HYDROCELE EXCISION    . IMPLANTABLE CARDIOVERTER DEFIBRILLATOR GENERATOR CHANGE N/A 04/25/2013   Procedure: IMPLANTABLE CARDIOVERTER DEFIBRILLATOR GENERATOR CHANGE;  Surgeon: Thompson Grayer, MD;  Location: [facility] CATH LAB;  Service: Cardiovascular;  Laterality: N/A;  . PACEMAKER INSERTION  2006  . REPLACEMENT TOTAL KNEE  2000, 2011 x2   bilateral        Home Medications    Prior to Admission medications   Medication Sig Start Date End Date Taking? Authorizing Provider  carvedilol (COREG) 25 MG tablet Take 12.5 mg by mouth 2 (two) times daily.    [provider]  furosemide (LASIX) 20 MG tablet Take 1 tablet (20 mg total) by mouth daily. 02/19/18   End, Harrell Gave, MD  Rivaroxaban (XARELTO) 20 MG TABS tablet Take 1 tablet (20 mg total) by mouth daily. 05/13/13  Larey Dresser, MD  simvastatin (ZOCOR) 40 MG tablet Take 20 mg by mouth every evening.     [provider]    Family History Family History  Problem Relation Age of Onset  . Emphysema Father        smoker  . Arthritis Father   . Bone cancer Maternal Uncle   . Brain cancer Brother        step-brother  . Breast cancer Cousin        maternal cousin  . Colon cancer Neg Hx     Social History Social History   Tobacco Use  . Smoking status: Former Smoker    Packs/day: 1.00    Years: 8.00    Pack years: 8.00    Types: Cigarettes    Last attempt to quit: 10/10/1997    Years since quitting: 20.6  . Smokeless tobacco:  Never Used  Substance Use Topics  . Alcohol use: Yes    Comment: a 6 pack of beer a week, three glasses of wine a week  . Drug use: No     Allergies   Lisinopril and Niacin   Review of Systems Review of Systems  Skin: Positive for wound.  Neurological: Negative for weakness and numbness.  All other systems reviewed and are negative.    Physical Exam Updated Vital Signs BP (!) 163/83   Pulse 61   Temp 98.3 F (36.8 C) (Oral)   Resp 16   SpO2 99%   Physical Exam  Constitutional: He appears well-developed and well-nourished.  HENT:  Head: Normocephalic and atraumatic.  Eyes: Conjunctivae and EOM are normal. Right eye exhibits no discharge. Left eye exhibits no discharge. No scleral icterus.  Cardiovascular:  Pulses:      Radial pulses are 2+ on the right side, and 2+ on the left side.  Pulmonary/Chest: Effort normal.  Musculoskeletal:  Flexion/extension of all 5 digits of right hand intact without any difficulty.  Mild tenderness palpation of the volar aspect of the right index finger.  Flexion/extension intact without any difficulty.  Neurological: He is alert.  Skin: Skin is warm and dry. Capillary refill takes less than 2 seconds.  2 cm jagged flap laceration noted to the volar aspect of the right index finger.  Laceration begins just proximal to the PIP joint extends slightly over the PIP.  Psychiatric: He has a normal mood and affect. His speech is normal and behavior is normal.  Nursing note and vitals reviewed.    ED Treatments / Results  Labs (all labs ordered are listed, but only abnormal results are displayed) Labs Reviewed - No data to display  EKG None  Radiology Dg Finger Index Right  Result Date: 05/31/2018 CLINICAL DATA:  Laceration EXAM: RIGHT FIRST AND SECOND FINGERS 2+V COMPARISON:  None. FINDINGS: Frontal, oblique, and lateral views obtained. No radiopaque foreign body. There is soft tissue air medial to the first distal phalanx. No fracture  or dislocation. There is slight osteoarthritic change in the first IP and second DIP joints. No erosive changes. There is also osteoarthritic change in the first carpal-metacarpal joint. On lateral view, the fifth DIP joint is seen and noted to show fairly extensive osteoarthritic change. IMPRESSION: Osteoarthritic change in several joints. No fracture or dislocation. No radiopaque foreign body. Focus of soft tissue air medial to the first distal phalanx. Electronically Signed   By: Lowella Grip III M.D.   On: 05/31/2018 10:50    Procedures .Marland KitchenLaceration Repair Date/Time: 05/31/2018 11:13 AM  Performed by: Volanda Napoleon, PA-C Authorized by: Volanda Napoleon, PA-C   Consent:    Consent obtained:  Verbal   Consent given by:  Patient   Risks discussed:  Infection, poor cosmetic result, pain and poor wound healing Anesthesia (see MAR for exact dosages):    Anesthesia method:  Nerve block   Block needle gauge:  25 G   Block anesthetic:  Lidocaine 1% w/o epi   Block injection procedure:  Anatomic landmarks identified, introduced needle, incremental injection and negative aspiration for blood   Block outcome:  Anesthesia achieved Laceration details:    Location:  Finger   Finger location:  R index finger   Length (cm):  2 Repair type:    Repair type:  Simple Pre-procedure details:    Preparation:  Patient was prepped and draped in usual sterile fashion Exploration:    Hemostasis achieved with:  Direct pressure   Wound extent: no foreign bodies/material noted     Contaminated: no   Treatment:    Area cleansed with:  Betadine   Amount of cleaning:  Extensive   Irrigation solution:  Sterile water   Irrigation method:  Syringe   Visualized foreign bodies/material removed: no   Skin repair:    Repair method:  Sutures   Suture size:  4-0   Suture material:  Nylon   Suture technique:  Simple interrupted   Number of sutures:  5 Approximation:    Approximation:   Close Post-procedure details:    Dressing:  Antibiotic ointment and sterile dressing   (including critical care time)  Medications Ordered in ED Medications  Tdap (BOOSTRIX) injection 0.5 mL (0.5 mLs Intramuscular Given 05/31/18 1049)  lidocaine (PF) (XYLOCAINE) 1 % injection 10 mL (10 mLs Intradermal Given 05/31/18 1049)  bacitracin ointment ( Topical Given 05/31/18 1121)     Initial Impression / Assessment and Plan / ED Course  I have reviewed the triage vital signs and the nursing notes.  Pertinent labs & imaging results that were available during my care of the patient were reviewed by me and considered in my medical decision making (see chart for details).     72 y.o. M presents for evaluation of right index finger laceration that occurred just prior to ED arrival.  Was working on a garage when the spring came loose, causing it to cut his finger.  Tetanus is not up-to-date. Patient is afebrile, non-toxic appearing, sitting comfortably on examination table.Vital signs reviewed and stable.  Patient is neurovascularly intact.  On exam has approxi-2 cm jagged laceration noted to the dorsal aspect of index finger that overlies the PIP joint.  Full flexion/extension intact.  Exam is not concerning for tendon injury.  Will update tetanus, repair laceration.  X-ray shows no acute bony abnormality.  X-ray reviewed as document above.  Patient tolerated procedure well.  Discussed wound care precautions with patient. Patient had ample opportunity for questions and discussion. All patient's questions were answered with full understanding. Strict return precautions discussed. Patient expresses understanding and agreement to plan.   Final Clinical Impressions(s) / ED Diagnoses   Final diagnoses:  Laceration of right index finger without foreign body without damage to nail, initial encounter    ED Discharge Orders    None       Desma Mcgregor 05/31/18 1229    Malvin Johns,  MD 05/31/18 1425

## 2018-05-31 NOTE — ED Notes (Signed)
ED Provider at bedside. 

## 2018-05-31 NOTE — Telephone Encounter (Signed)
Patient advised Pamala Hurry, device CMA that he no longer wanted to have monthly ICM follow.  Disenrolled from [facility] clinic but will continue to remote transmissions every 3 months for device clinic.

## 2018-05-31 NOTE — ED Triage Notes (Signed)
Laceration to right index and thumb.  No active bleeding.

## 2018-05-31 NOTE — Discharge Instructions (Signed)
Keep the wound clean and dry for the first 24 hours. After that you may gently clean the wound with soap and water. Make sure to pat dry the wound before covering it with any dressing. You can use topical antibiotic ointment and bandage. Ice and elevate for pain relief.  ° °You can take Tylenol or Ibuprofen as directed for pain. You can alternate Tylenol and Ibuprofen every 4 hours for additional pain relief.  ° °Return to the Emergency Department, your primary care doctor, or the [facility] in 5-7 days for suture removal.  ° °Monitor closely for any signs of infection. Return to the Emergency Department for any worsening redness/swelling of the area that begins to spread, drainage from the site, worsening pain, fever or any other worsening or concerning symptoms.  ° ° °

## 2018-06-05 ENCOUNTER — Emergency Department (HOSPITAL_BASED_OUTPATIENT_CLINIC_OR_DEPARTMENT_OTHER): Payer: Medicare Other

## 2018-06-05 ENCOUNTER — Encounter (HOSPITAL_BASED_OUTPATIENT_CLINIC_OR_DEPARTMENT_OTHER): Payer: Self-pay | Admitting: *Deleted

## 2018-06-05 ENCOUNTER — Emergency Department (HOSPITAL_BASED_OUTPATIENT_CLINIC_OR_DEPARTMENT_OTHER)
Admission: EM | Admit: 2018-06-05 | Discharge: 2018-06-05 | Disposition: A | Discharge status: Home / Self Care | Payer: Medicare Other | Attending: Emergency Medicine | Admitting: Emergency Medicine

## 2018-06-05 ENCOUNTER — Other Ambulatory Visit: Payer: Self-pay

## 2018-06-05 DIAGNOSIS — Z79899 Other long term (current) drug therapy: Secondary | ICD-10-CM | POA: Diagnosis not present

## 2018-06-05 DIAGNOSIS — R05 Cough: Secondary | ICD-10-CM | POA: Diagnosis present

## 2018-06-05 DIAGNOSIS — J069 Acute upper respiratory infection, unspecified: Secondary | ICD-10-CM

## 2018-06-05 DIAGNOSIS — R0602 Shortness of breath: Secondary | ICD-10-CM | POA: Insufficient documentation

## 2018-06-05 DIAGNOSIS — Z7901 Long term (current) use of anticoagulants: Secondary | ICD-10-CM | POA: Insufficient documentation

## 2018-06-05 DIAGNOSIS — I2510 Atherosclerotic heart disease of native coronary artery without angina pectoris: Secondary | ICD-10-CM | POA: Insufficient documentation

## 2018-06-05 DIAGNOSIS — Z950 Presence of cardiac pacemaker: Secondary | ICD-10-CM | POA: Insufficient documentation

## 2018-06-05 DIAGNOSIS — Z4802 Encounter for removal of sutures: Secondary | ICD-10-CM | POA: Insufficient documentation

## 2018-06-05 DIAGNOSIS — I5022 Chronic systolic (congestive) heart failure: Secondary | ICD-10-CM | POA: Diagnosis not present

## 2018-06-05 DIAGNOSIS — Z96653 Presence of artificial knee joint, bilateral: Secondary | ICD-10-CM | POA: Insufficient documentation

## 2018-06-05 DIAGNOSIS — Z87891 Personal history of nicotine dependence: Secondary | ICD-10-CM | POA: Insufficient documentation

## 2018-06-05 DIAGNOSIS — B9789 Other viral agents as the cause of diseases classified elsewhere: Secondary | ICD-10-CM

## 2018-06-05 LAB — CBC WITH DIFFERENTIAL/PLATELET
BASOS ABS: 0 10*3/uL (ref 0.0–0.1)
BASOS PCT: 0 %
EOS ABS: 0.3 10*3/uL (ref 0.0–0.7)
EOS PCT: 5 %
HCT: 41.3 % (ref 39.0–52.0)
Hemoglobin: 13.9 g/dL (ref 13.0–17.0)
Lymphocytes Relative: 9 %
Lymphs Abs: 0.6 10*3/uL — ABNORMAL LOW (ref 0.7–4.0)
MCH: 33.6 pg (ref 26.0–34.0)
MCHC: 33.7 g/dL (ref 30.0–36.0)
MCV: 99.8 fL (ref 78.0–100.0)
MONO ABS: 0.6 10*3/uL (ref 0.1–1.0)
MONOS PCT: 9 %
Neutro Abs: 4.7 10*3/uL (ref 1.7–7.7)
Neutrophils Relative %: 77 %
PLATELETS: 187 10*3/uL (ref 150–400)
RBC: 4.14 MIL/uL — ABNORMAL LOW (ref 4.22–5.81)
RDW: 13.2 % (ref 11.5–15.5)
WBC: 6.2 10*3/uL (ref 4.0–10.5)

## 2018-06-05 LAB — TROPONIN I

## 2018-06-05 LAB — BRAIN NATRIURETIC PEPTIDE: B Natriuretic Peptide: 78.7 pg/mL (ref 0.0–100.0)

## 2018-06-05 LAB — BASIC METABOLIC PANEL
Anion gap: 13 (ref 5–15)
BUN: 16 mg/dL (ref 8–23)
CALCIUM: 9 mg/dL (ref 8.9–10.3)
CO2: 29 mmol/L (ref 22–32)
CREATININE: 1.13 mg/dL (ref 0.61–1.24)
Chloride: 99 mmol/L (ref 98–111)
GFR calc Af Amer: >60 mL/min (ref >60)
GLUCOSE: 108 mg/dL — AB (ref 70–99)
Potassium: 3.8 mmol/L (ref 3.5–5.1)
Sodium: 141 mmol/L (ref 135–145)

## 2018-06-05 MED ORDER — BENZONATATE 100 MG PO CAPS: 100.0000 mg | ORAL_CAPSULE | Freq: Three times a day (TID) | ORAL | 0 refills | Status: AC

## 2018-06-05 MED FILL — BENZONATATE 100 MG CAPSULE: 100 | 10 days supply | Qty: 30 | Fill #0

## 2018-06-05 NOTE — ED Notes (Signed)
ED Provider at bedside. 

## 2018-06-05 NOTE — Discharge Instructions (Addendum)
Your lab work, EKG and chest x-ray today look good. Your cough is probably residual from a viral infection. I have prescribed you Tessalon which is for the cough. You may continue using your Mucinex for the nasal congestion.  Please follow-up with your PCP as scheduled.  I have also removed all of your stitches today.

## 2018-06-05 NOTE — ED Triage Notes (Signed)
Cough and congestion x 3 weeks. No fever.

## 2018-06-05 NOTE — ED Provider Notes (Signed)
[facility] DEPART[facility] Provider Note  CSN: [id number] Arrival date & time: 06/05/18  1202    History   Chief Complaint Chief Complaint  Patient presents with  . Cough    HPI [name] is a 72 y.o. male with a medical history of AAA, CAD, diastolic HF with ICD, GERD and hemochromatosis who presented to the ED for cough x3 weeks. Associated symptoms: sinus pressure, orthopnea and congestion. Patient has tried nothing prior to coming to the ED. Denies recent sick contacts, travel or hospitalizations. Denies chest pain, hemoptysis, palpitations or leg swelling. Patient states at baseline he has SOB, but that has not changed.  Patient also requests that his sutures be removed. He reports cutting his right index finger on 05/31/18 and had 5 stitches placed for repair. Denies fever, tenderness, warmth or drainage from wound.  Past Medical History:  Diagnosis Date  . AAA (abdominal aortic aneurysm) ([place])   . Arthritis   . Atrial fibrillation ([place])    permanent  . CAD (coronary artery disease)   . Chronic systolic dysfunction of left ventricle    s/p BiV ICD//downgrade to CRT-P (medtronic) on 04-25-2013 by Dr Rayann Heman  . Colon polyps   . Complete heart block (H[facility])    s/p AV nodal ablation for afib  . Dysphagia   . Elevated LFTs   . Fatigue   . GERD (gastroesophageal reflux disease)   . Gout   . Hemochromatosis   . Hyperlipidemia   . IFG (impaired fasting glucose)   . Osteoarthritis   . Sleep apnea     Patient Active Problem List   Diagnosis Date Noted  . ICD (implantable cardioverter-defibrillator) battery depletion 04/17/2013  . Insomnia 03/14/2013  . Dyspnea 03/14/2013  . Long term (current) use of anticoagulants 09/14/2011  . Chronic diastolic heart failure ([place]) 09/05/2011  . NICM (nonischemic cardiomyopathy) ([place]) 09/05/2011  . OSA (obstructive sleep apnea) 05/13/2011  . AV BLOCK, COMPLETE 08/14/2009  . Atrial fibrillation ([place]) 08/14/2009  .  Other primary cardiomyopathies 04/30/2009  . OTHER DYSPHAGIA 01/13/2009  . HYPERCHOLESTEROLEMIA 01/08/2009  . HYPERTENSION 01/08/2009  . ABNORMAL HEART RHYTHMS 01/08/2009  . GERD 01/08/2009  . COLONIC POLYPS, ADENOMATOUS, HX OF 01/08/2009    Past Surgical History:  Procedure Laterality Date  . AV NODE ABLATION    . BIV PACEMAKER GENERATOR CHANGE OUT  04-25-2013   downgrade of previously implanted CRTD to Medtronic CRTP by Dr Rayann Heman.  Previously implanted LV and 4076 RV leads were used  . CARDIAC DEFIBRILLATOR PLACEMENT  2007, 2010   MDT BiV ICD,  [phone] RV lead, though pace/ sense is a Medtronic model 4076 - 58  lead; downgraded to CRTP on 04-25-2013 by Dr Rayann Heman  . HAND SURGERY     left  . HERNIA REPAIR    . HYDROCELE EXCISION    . IMPLANTABLE CARDIOVERTER DEFIBRILLATOR GENERATOR CHANGE N/A 04/25/2013   Procedure: IMPLANTABLE CARDIOVERTER DEFIBRILLATOR GENERATOR CHANGE;  Surgeon: Thompson Grayer, MD;  Location: [facility] CATH LAB;  Service: Cardiovascular;  Laterality: N/A;  . PACEMAKER INSERTION  2006  . REPLACEMENT TOTAL KNEE  2000, 2011 x2   bilateral        Home Medications    Prior to Admission medications   Medication Sig Start Date End Date Taking? Authorizing Provider  benzonatate (TESSALON) 100 MG capsule Take 1 capsule (100 mg total) by mouth every 8 (eight) hours for 10 days. 06/05/18 06/15/18  Mortis, Alvie Heidelberg I, PA-C  carvedilol (COREG) 25 MG tablet Take  12.5 mg by mouth 2 (two) times daily.    [provider]  furosemide (LASIX) 20 MG tablet Take 1 tablet (20 mg total) by mouth daily. 02/19/18   End, Harrell Gave, MD  Rivaroxaban (XARELTO) 20 MG TABS tablet Take 1 tablet (20 mg total) by mouth daily. 05/13/13   Larey Dresser, MD  simvastatin (ZOCOR) 40 MG tablet Take 20 mg by mouth every evening.     [provider]    Family History Family History  Problem Relation Age of Onset  . Emphysema Father        smoker  . Arthritis Father   . Bone cancer  Maternal Uncle   . Brain cancer Brother        step-brother  . Breast cancer Cousin        maternal cousin  . Colon cancer Neg Hx     Social History Social History   Tobacco Use  . Smoking status: Former Smoker    Packs/day: 1.00    Years: 8.00    Pack years: 8.00    Types: Cigarettes    Last attempt to quit: 10/10/1997    Years since quitting: 20.6  . Smokeless tobacco: Never Used  Substance Use Topics  . Alcohol use: Yes    Comment: a 6 pack of beer a week, three glasses of wine a week  . Drug use: No     Allergies   Lisinopril and Niacin   Review of Systems Review of Systems  Constitutional: Negative for chills, fatigue and fever.  HENT: Positive for congestion and sinus pressure. Negative for postnasal drip, rhinorrhea and sore throat.   Eyes: Negative.   Respiratory: Positive for cough and shortness of breath. Negative for chest tightness and wheezing.   Cardiovascular: Negative for chest pain and palpitations.  Gastrointestinal: Negative for abdominal distention.  Musculoskeletal: Negative.   Skin: Negative.   Neurological: Negative.   Hematological: Bruises/bleeds easily.     Physical Exam Updated Vital Signs BP 135/85 (BP Location: Right Arm)   Pulse 81   Temp 98.2 F (36.8 C) (Oral)   Resp 16   Ht 6\' 5"  (1.956 m)   Wt 113.4 kg   SpO2 99%   BMI 29.65 kg/m   Physical Exam  Constitutional: Vital signs are normal. He appears well-developed and well-nourished. He is cooperative. No distress.  HENT:  Head: Normocephalic and atraumatic.  Right Ear: Tympanic membrane, external ear and ear canal normal.  Left Ear: Tympanic membrane, external ear and ear canal normal.  Nose: Right sinus exhibits maxillary sinus tenderness. Right sinus exhibits no frontal sinus tenderness. Left sinus exhibits maxillary sinus tenderness. Left sinus exhibits no frontal sinus tenderness.  Mouth/Throat: Uvula is midline, oropharynx is clear and moist and mucous membranes are  normal.  Neck: Normal range of motion and full passive range of motion without pain. Neck supple. Normal carotid pulses and no JVD present.  Cardiovascular: Normal rate, regular rhythm and normal heart sounds.  Pulmonary/Chest: Effort normal and breath sounds normal. No tachypnea. No respiratory distress. He has no decreased breath sounds. He has no wheezes. He has no rales.  Abdominal: Soft. Normal appearance and bowel sounds are normal. He exhibits no distension. There is no tenderness.  Neurological: He is alert.  Skin: Skin is warm and intact. Capillary refill takes less than 2 seconds.  Psychiatric: He has a normal mood and affect. His speech is normal and behavior is normal. Thought content normal.   ED Treatments /  Results  Labs (all labs ordered are listed, but only abnormal results are displayed) Labs Reviewed  BASIC METABOLIC PANEL - Abnormal; Notable for the following components:      Result Value   Glucose, Bld 108 (*)    All other components within normal limits  CBC WITH DIFFERENTIAL/PLATELET - Abnormal; Notable for the following components:   RBC 4.14 (*)    Lymphs Abs 0.6 (*)    All other components within normal limits  BRAIN NATRIURETIC PEPTIDE  TROPONIN I    EKG EKG Interpretation  Date/Time:  Tuesday June 05 2018 13:05:43 EDT Ventricular Rate:  64 PR Interval:    QRS Duration: 136 QT Interval:  434 QTC Calculation: 448 R Axis:   -93 Text Interpretation:  Sinus rhythm Prolonged PR interval Nonspecific IVCD with LAD unchanged from No significant change since last tracing Confirmed by Lennice Sites [phone]) on 06/05/2018 1:09:30 PM Also confirmed by Lennice Sites [id number]), editor Lynder Parents [id number])  on 06/05/2018 3:50:34 PM   Radiology Dg Chest 2 View  Result Date: 06/05/2018 CLINICAL DATA:  Congestion and cough for 3 weeks EXAM: CHEST - 2 VIEW COMPARISON:  03/14/2013 FINDINGS: LEFT subclavian AICD leads project at RIGHT atrium, RIGHT ventricle and  coronary sinus unchanged. Normal heart size, mediastinal contours, and pulmonary vascularity. Lungs clear. No acute infiltrate, pleural effusion or pneumothorax. 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 Bones otherwise unremarkable. IMPRESSION: No acute abnormalities. Electronically Signed   By: Lavonia Dana M.D.   On: 06/05/2018 13:29    Procedures .Suture Removal Date/Time: 06/05/2018 3:09 PM Performed by: Romie Jumper, PA-C Authorized by: Maury Dus I, PA-C   Consent:    Consent obtained:  Verbal   Consent given by:  Patient   Risks discussed:  Wound separation, pain and bleeding   Alternatives discussed:  No treatment Location:    Location:  Upper extremity   Upper extremity location:  Hand   Hand location:  R index finger Procedure details:    Wound appearance:  No signs of infection, good wound healing, nontender, nonpurulent and clean   Number of sutures removed:  5 Post-procedure details:    Post-removal:  No dressing applied   Patient tolerance of procedure:  Tolerated well, no immediate complications   (including critical care time)  Medications Ordered in ED Medications - No data to display   Initial Impression / Assessment and Plan / ED Course  Triage vital signs and the nursing notes have been reviewed.  Pertinent labs & imaging results that were available during care of the patient were reviewed and considered in medical decision making (see chart for details).  Patient presents to the ED for upper respiratory symptoms of cough and nasal congestion x3 weeks. Physical exam is unremarkable. However, patient's ongoing complaints, orthopnea and shortness of breath is concerning. He has a complicated cardiac history. Will evaluate patient for acute cardiac or pulmonary etiology to his complaints.  Clinical Course as of Jun 05 1554  Tue Jun 05, 2018  1409 Labs unremarkable. No acute abnormalities on CXR. No consolidation, vascular  congestion or intra/extra-pleural abnormalities. EKG showed no ST elevations/depressions or evidence of acute ischemia/infarct. Prolonged PR interval. Overall tracing is consistent with patient's last EKG in 02/2018.   [GM]    Clinical Course User Index [GM] Mortis, Alvie Heidelberg I, PA-C   Work-up today is unremarkable which is reassuring that this cough is due to a viral etiology.  Final Clinical Impressions(s) / ED Diagnoses  1. Viral  URI with Cough. Rx for Tessalon to assist with cough. Education on OTC and supportive treatment for symptom relief. Advised to follow-up with PCP. 2. Suture Removal. 5 sutures removed from patient's right index finger from laceration repair on 05/31/18. Wound was approximated well and has no signs of infection. Education provided on wound care and s/s of infection that warrant follow-up.  Dispo: Home. After thorough clinical evaluation, this patient is determined to be medically stable and can be safely discharged with the previously mentioned treatment and/or outpatient follow-up/referral(s). At this time, there are no other apparent medical conditions that require further screening, evaluation or treatment.   Final diagnoses:  Viral URI with cough  Visit for suture removal    ED Discharge Orders         Ordered    benzonatate (TESSALON) 100 MG capsule  Every 8 hours     06/05/18 1457            Mortis, Mallard I, PA-C 06/05/18 [place], [place], DO 06/05/18 1606

## 2018-07-04 NOTE — Progress Notes (Signed)
Follow-up Outpatient Visit Date: 07/05/2018  Primary Care Provider: [facility] [place], [place] [address]  Chief Complaint: Follow-up shortness of breath  HPI:  Mr. Barbier is a 72 y.o. year-old male with history of nonischemic cardiomyopathy (clean cath in 2004, negative stress test in 2009), chronic atrial fibrillation status post AV node ablation and biventricular pacemaker, hypertension, hyperlipidemia, obstructive sleep apnea on CPAP, and GERD, who presents for follow-up of cardiomyopathy.  I last saw Mr. Everton in May, at which time he continued to experience exertional dyspnea and generalized fatigue, particularly in the evenings.  Subsequent echocardiogram showed LVEF of 50% with evidence of mildly to moderately elevated pulmonary artery and central venous pressures.  We therefore increased furosemide to see if this would improve his symptoms.  Today, Mr. Chui reports feeling about the same as at our last visit.  He did not notice any improvement in his exertional dyspnea (present for at least 15 years) with addition of furosemide.  He remains on 20 mg daily.  He developed a "bad cough" with yellow sputum production earlier this summer and notes that it is gradually improving.  He has not had any orthopnea, PND, or edema.  He also denies chest pain, palpitations, and lightheadedness.  He remains on rivaroxaban without bleeding.  --------------------------------------------------------------------------------------------------  Past Medical History:  Diagnosis Date  . AAA (abdominal aortic aneurysm) ([place])   . Arthritis   . Atrial fibrillation ([place])    permanent  . CAD (coronary artery disease)   . Chronic systolic dysfunction of left ventricle    s/p BiV ICD//downgrade to CRT-P (medtronic) on 04-25-2013 by Dr Rayann Heman  . Colon polyps   . Complete heart block ([facility])    s/p AV nodal ablation for afib  . Dysphagia   . Elevated LFTs   . Fatigue   . GERD (gastroesophageal  reflux disease)   . Gout   . Hemochromatosis   . Hyperlipidemia   . IFG (impaired fasting glucose)   . Osteoarthritis   . Sleep apnea    Past Surgical History:  Procedure Laterality Date  . AV NODE ABLATION    . BIV PACEMAKER GENERATOR CHANGE OUT  04-25-2013   downgrade of previously implanted CRTD to Medtronic CRTP by Dr Rayann Heman.  Previously implanted LV and 4076 RV leads were used  . CARDIAC DEFIBRILLATOR PLACEMENT  2007, 2010   MDT BiV ICD,  [id number] RV lead, though pace/ sense is a Medtronic model 4076 - 58  lead; downgraded to CRTP on 04-25-2013 by Dr Rayann Heman  . HAND SURGERY     left  . HERNIA REPAIR    . HYDROCELE EXCISION    . IMPLANTABLE CARDIOVERTER DEFIBRILLATOR GENERATOR CHANGE N/A 04/25/2013   Procedure: IMPLANTABLE CARDIOVERTER DEFIBRILLATOR GENERATOR CHANGE;  Surgeon: Thompson Grayer, MD;  Location: [facility] CATH LAB;  Service: Cardiovascular;  Laterality: N/A;  . PACEMAKER INSERTION  2006  . REPLACEMENT TOTAL KNEE  2000, 2011 x2   bilateral    Current Meds  Medication Sig  . carvedilol (COREG) 25 MG tablet Take 12.5 mg by mouth 2 (two) times daily.  . furosemide (LASIX) 20 MG tablet Take 1 tablet (20 mg total) by mouth daily.  . Rivaroxaban (XARELTO) 20 MG TABS tablet Take 1 tablet (20 mg total) by mouth daily.  . simvastatin (ZOCOR) 40 MG tablet Take 20 mg by mouth every evening.    Current Facility-Administered Medications for the 07/05/18 encounter (Office Visit) with Mahek Schlesinger, Harrell Gave, MD  Medication  . 0.9 %  sodium chloride infusion    Allergies: Lisinopril and Niacin  Social History   Tobacco Use  . Smoking status: Former Smoker    Packs/day: 1.00    Years: 8.00    Pack years: 8.00    Types: Cigarettes    Last attempt to quit: 10/10/1997    Years since quitting: 20.7  . Smokeless tobacco: Never Used  Substance Use Topics  . Alcohol use: Yes    Comment: a 6 pack of beer a week, three glasses of wine a week  . Drug use: No    Family History  Problem Relation  Age of Onset  . Emphysema Father        smoker  . Arthritis Father   . Bone cancer Maternal Uncle   . Brain cancer Brother        step-brother  . Breast cancer Cousin        maternal cousin  . Colon cancer Neg Hx     Review of Systems: A 12-system review of systems was performed and was negative except as noted in the HPI.  --------------------------------------------------------------------------------------------------  Physical Exam: BP 112/70   Pulse 71   Ht 6\' 5"  (1.956 m)   Wt 259 lb (117.5 kg)   SpO2 95%   BMI 30.71 kg/m   General: NAD. HEENT: No conjunctival pallor or scleral icterus. Moist mucous membranes.  OP clear. Neck: Supple without lymphadenopathy, thyromegaly, JVD, or HJR. No carotid bruit. Lungs: Normal work of breathing. Clear to auscultation bilaterally without wheezes or crackles. Heart: Regular rate and rhythm without murmurs, rubs, or gallops. Non-displaced PMI. Abd: Bowel sounds present. Soft, NT/ND without hepatosplenomegaly Ext: Trace pretibial edema bilaterally. Radial, PT, and DP pulses are 2+ bilaterally. Skin: Warm and dry without rash.  EKG: Atrial fibrillation with ventricular pacing.  Lab Results  Component Value Date   WBC 6.2 06/05/2018   HGB 13.9 06/05/2018   HCT 41.3 06/05/2018   MCV 99.8 06/05/2018   PLT 187 06/05/2018    Lab Results  Component Value Date   NA 141 06/05/2018   K 3.8 06/05/2018   CL 99 06/05/2018   CO2 29 06/05/2018   BUN 16 06/05/2018   CREATININE 1.13 06/05/2018   GLUCOSE 108 (H) 06/05/2018   ALT 20 08/24/2009    Lab Results  Component Value Date   CHOL 120 01/08/2015   HDL 50.70 01/08/2015   LDLCALC 58 01/08/2015   TRIG 55.0 01/08/2015   CHOLHDL 2 01/08/2015    --------------------------------------------------------------------------------------------------  ASSESSMENT AND PLAN: Nonischemic cardiomyopathy Other than minimal chronic lower extremity edema, Mr. Hannen appears euvolemic and well  compensated with NYHA class II symptoms.  Chronic dyspnea on exertion has been present for at least 15 years and is unchanged.  We discussed further work-up, including left and right heart catheterization, but have agreed to defer this given extensive work-up over the last 15 years and stable symptoms.  We will continue his current medications including low-dose furosemide.  I will defer adding an ARB given low normal LVEF and low normal blood pressure.  Permanent atrial fibrillation status post AV node ablation and pacemaker Continue rivaroxaban and routine follow-up with EP.  Follow-up: Return to clinic in 6 months with Dr. Marlou Porch, given my transition to the [facility] office.  Nelva Tarrant, MD 07/05/2018 3:38 PM

## 2018-07-05 ENCOUNTER — Ambulatory Visit (INDEPENDENT_AMBULATORY_CARE_PROVIDER_SITE_OTHER): Payer: Medicare Other | Admitting: Internal Medicine

## 2018-07-05 ENCOUNTER — Encounter: Payer: Self-pay | Admitting: Internal Medicine

## 2018-07-05 VITALS — BP 112/70 | HR 71 | Ht 77.0 in | Wt 259.0 lb

## 2018-07-05 DIAGNOSIS — I428 Other cardiomyopathies: Secondary | ICD-10-CM

## 2018-07-05 DIAGNOSIS — Z950 Presence of cardiac pacemaker: Secondary | ICD-10-CM

## 2018-07-05 DIAGNOSIS — I482 Chronic atrial fibrillation: Secondary | ICD-10-CM | POA: Diagnosis not present

## 2018-07-05 DIAGNOSIS — I4821 Permanent atrial fibrillation: Secondary | ICD-10-CM

## 2018-07-05 NOTE — Patient Instructions (Addendum)
Medication Instructions:  Your physician recommends that you continue on your current medications as directed. Please refer to the Current Medication list given to you today.  -- If you need a refill on your cardiac medications before your next appointment, please call your pharmacy. --  Labwork: None ordered  Testing/Procedures: None ordered  Follow-Up: Your physician wants you to follow-up in: 6 months with Dr. Dawna Part will receive a reminder letter in the mail two months in advance. If you don't receive a letter, please call our office to schedule the follow-up appointment.  Thank you for choosing [facility]!!    Any Other Special Instructions Will Be Listed Below (If Applicable).   Mediterranean Diet A Mediterranean diet refers to food and lifestyle choices that are based on the traditions of countries located on the [employer]. This way of eating has been shown to help prevent certain conditions and improve outcomes for people who have chronic diseases, like kidney disease and heart disease. What are tips for following this plan? Lifestyle  Cook and eat meals together with your family, when possible.  Drink enough fluid to keep your urine clear or pale yellow.  Be physically active every day. This includes: ? Aerobic exercise like running or swimming. ? Leisure activities like gardening, walking, or housework.  Get 7-8 hours of sleep each night.  If recommended by your health care provider, drink red wine in moderation. This means 1 glass a day for nonpregnant women and 2 glasses a day for men. A glass of wine equals 5 oz (150 mL). Reading food labels  Check the serving size of packaged foods. For foods such as rice and pasta, the serving size refers to the amount of cooked product, not dry.  Check the total fat in packaged foods. Avoid foods that have saturated fat or trans fats.  Check the ingredients list for added sugars, such as corn  syrup. Shopping  At the grocery store, buy most of your food from the areas near the walls of the store. This includes: ? Fresh fruits and vegetables (produce). ? Grains, beans, nuts, and seeds. Some of these may be available in unpackaged forms or large amounts (in bulk). ? Fresh seafood. ? Poultry and eggs. ? Low-fat dairy products.  Buy whole ingredients instead of prepackaged foods.  Buy fresh fruits and vegetables in-season from local farmers markets.  Buy frozen fruits and vegetables in resealable bags.  If you do not have access to quality fresh seafood, buy precooked frozen shrimp or canned fish, such as tuna, salmon, or sardines.  Buy small amounts of raw or cooked vegetables, salads, or olives from the deli or salad bar at your store.  Stock your pantry so you always have certain foods on hand, such as olive oil, canned tuna, canned tomatoes, rice, pasta, and beans. Cooking  Cook foods with extra-virgin olive oil instead of using butter or other vegetable oils.  Have meat as a side dish, and have vegetables or grains as your main dish. This means having meat in small portions or adding small amounts of meat to foods like pasta or stew.  Use beans or vegetables instead of meat in common dishes like chili or lasagna.  Experiment with different cooking methods. Try roasting or broiling vegetables instead of steaming or sauteing them.  Add frozen vegetables to soups, stews, pasta, or rice.  Add nuts or seeds for added healthy fat at each meal. You can add these to yogurt, salads, or  vegetable dishes.  Marinate fish or vegetables using olive oil, lemon juice, garlic, and fresh herbs. Meal planning  Plan to eat 1 vegetarian meal one day each week. Try to work up to 2 vegetarian meals, if possible.  Eat seafood 2 or more times a week.  Have healthy snacks readily available, such as: ? Vegetable sticks with hummus. ? [place] yogurt. ? Fruit and nut trail mix.  Eat  balanced meals throughout the week. This includes: ? Fruit: 2-3 servings a day ? Vegetables: 4-5 servings a day ? Low-fat dairy: 2 servings a day ? Fish, poultry, or lean meat: 1 serving a day ? Beans and legumes: 2 or more servings a week ? Nuts and seeds: 1-2 servings a day ? Whole grains: 6-8 servings a day ? Extra-virgin olive oil: 3-4 servings a day  Limit red meat and sweets to only a few servings a month What are my food choices?  Mediterranean diet ? Recommended ? Grains: Whole-grain pasta. Brown rice. Bulgar wheat. Polenta. Couscous. Whole-wheat bread. [place]. ? Vegetables: Artichokes. Beets. Broccoli. Cabbage. Carrots. Eggplant. Green beans. Chard. Kale. Spinach. Onions. Leeks. Peas. Squash. Tomatoes. Peppers. Radishes. ? Fruits: Apples. Apricots. Avocado. Berries. Bananas. Cherries. Dates. Figs. Grapes. Lemons. Melon. Oranges. Peaches. Plums. Pomegranate. ? Meats and other protein foods: Beans. Almonds. Sunflower seeds. Pine nuts. Peanuts. [place]. Salmon. Scallops. Shrimp. [place]. Tilapia. Clams. Oysters. Eggs. ? Dairy: Low-fat milk. Cheese. Greek yogurt. ? Beverages: Water. Red wine. Herbal tea. ? Fats and oils: Extra virgin olive oil. Avocado oil. Grape seed oil. ? Sweets and desserts: [place] yogurt with honey. Baked apples. Poached pears. Trail mix. ? Seasoning and other foods: Basil. Cilantro. Coriander. Cumin. Mint. Parsley. Sage. Rosemary. Tarragon. Garlic. Oregano. Thyme. Pepper. Balsalmic vinegar. Tahini. Hummus. Tomato sauce. Olives. Mushrooms. ? Limit these ? Grains: Prepackaged pasta or rice dishes. Prepackaged cereal with added sugar. ? Vegetables: Deep fried potatoes (french fries). ? Fruits: Fruit canned in syrup. ? Meats and other protein foods: Beef. Pork. Lamb. Poultry with skin. Hot dogs. Berniece Salines. ? Dairy: Ice cream. Sour cream. Whole milk. ? Beverages: Juice. Sugar-sweetened soft drinks. Beer. Liquor and spirits. ? Fats and oils: Butter. Canola oil.  Vegetable oil. Beef fat (tallow). Lard. ? Sweets and desserts: Cookies. Cakes. Pies. Candy. ? Seasoning and other foods: Mayonnaise. Premade sauces and marinades. ? The items listed may not be a complete list. Talk with your dietitian about what dietary choices are right for you. Summary  The Mediterranean diet includes both food and lifestyle choices.  Eat a variety of fresh fruits and vegetables, beans, nuts, seeds, and whole grains.  Limit the amount of red meat and sweets that you eat.  Talk with your health care provider about whether it is safe for you to drink red wine in moderation. This means 1 glass a day for nonpregnant women and 2 glasses a day for men. A glass of wine equals 5 oz (150 mL). This information is not intended to replace advice given to you by your health care provider. Make sure you discuss any questions you have with your health care provider. Document Released: 05/19/2016 Document Revised: 06/21/2016 Document Reviewed: 05/19/2016 Elsevier Interactive Patient Education  [employer].

## 2018-07-06 ENCOUNTER — Encounter: Payer: Self-pay | Admitting: Internal Medicine

## 2018-07-09 ENCOUNTER — Ambulatory Visit (INDEPENDENT_AMBULATORY_CARE_PROVIDER_SITE_OTHER): Payer: Medicare Other | Admitting: *Deleted

## 2018-07-09 ENCOUNTER — Telehealth: Payer: Self-pay | Admitting: Cardiology

## 2018-07-09 DIAGNOSIS — I428 Other cardiomyopathies: Secondary | ICD-10-CM | POA: Diagnosis not present

## 2018-07-09 NOTE — Telephone Encounter (Signed)
LMOVM reminding pt to send remote transmission.   

## 2018-07-10 ENCOUNTER — Ambulatory Visit (INDEPENDENT_AMBULATORY_CARE_PROVIDER_SITE_OTHER): Payer: Medicare Other | Admitting: *Deleted

## 2018-07-10 DIAGNOSIS — I428 Other cardiomyopathies: Secondary | ICD-10-CM | POA: Diagnosis not present

## 2018-07-11 NOTE — Progress Notes (Signed)
Remote pacemaker transmission.   

## 2018-07-12 NOTE — Progress Notes (Signed)
Remote pacemaker transmission.   

## 2018-07-13 LAB — CUP PACEART REMOTE DEVICE CHECK
Battery Remaining Longevity: 26 mo
Battery Voltage: 2.93 V
Brady Statistic AP VS Percent: 0 %
Brady Statistic AS VP Percent: 99.57 %
Brady Statistic RA Percent Paced: 0 %
Date Time Interrogation Session: 20191002012102
Implantable Lead Implant Date: 20061130
Implantable Lead Implant Date: 20061130
Implantable Lead Implant Date: 20061130
Implantable Lead Location: 753858
Implantable Lead Location: 753859
Implantable Lead Model: 4194
Implantable Lead Model: 6949
Lead Channel Impedance Value: 418 Ohm
Lead Channel Impedance Value: 589 Ohm
Lead Channel Impedance Value: 646 Ohm
Lead Channel Impedance Value: 836 Ohm
Lead Channel Pacing Threshold Amplitude: 0.375 V
Lead Channel Sensing Intrinsic Amplitude: 0.875 mV
Lead Channel Setting Pacing Amplitude: 1.25 V
Lead Channel Setting Pacing Amplitude: 2 V
Lead Channel Setting Pacing Pulse Width: 0.4 ms
Lead Channel Setting Sensing Sensitivity: 4 mV
MDC IDC LEAD IMPLANT DT: 20061130
MDC IDC LEAD LOCATION: 753860
MDC IDC LEAD LOCATION: 753860
MDC IDC MSMT LEADCHNL LV IMPEDANCE VALUE: 513 Ohm
MDC IDC MSMT LEADCHNL LV IMPEDANCE VALUE: 779 Ohm
MDC IDC MSMT LEADCHNL RA IMPEDANCE VALUE: 437 Ohm
MDC IDC MSMT LEADCHNL RA IMPEDANCE VALUE: 532 Ohm
MDC IDC MSMT LEADCHNL RA SENSING INTR AMPL: 0.875 mV
MDC IDC MSMT LEADCHNL RV IMPEDANCE VALUE: 646 Ohm
MDC IDC MSMT LEADCHNL RV PACING THRESHOLD PULSEWIDTH: 0.4 ms
MDC IDC PG IMPLANT DT: 20140717
MDC IDC SET LEADCHNL LV PACING PULSEWIDTH: 0.4 ms
MDC IDC STAT BRADY AP VP PERCENT: 0 %
MDC IDC STAT BRADY AS VS PERCENT: 0.43 %
MDC IDC STAT BRADY RV PERCENT PACED: 99.7 %

## 2018-07-30 ENCOUNTER — Encounter (HOSPITAL_BASED_OUTPATIENT_CLINIC_OR_DEPARTMENT_OTHER): Payer: Self-pay | Admitting: Emergency Medicine

## 2018-07-30 ENCOUNTER — Emergency Department (HOSPITAL_BASED_OUTPATIENT_CLINIC_OR_DEPARTMENT_OTHER)
Admission: EM | Admit: 2018-07-30 | Discharge: 2018-07-30 | Disposition: A | Discharge status: Home / Self Care | Payer: Medicare Other | Attending: Emergency Medicine | Admitting: Emergency Medicine

## 2018-07-30 ENCOUNTER — Other Ambulatory Visit: Payer: Self-pay

## 2018-07-30 ENCOUNTER — Emergency Department (HOSPITAL_BASED_OUTPATIENT_CLINIC_OR_DEPARTMENT_OTHER): Payer: Medicare Other

## 2018-07-30 DIAGNOSIS — Z7901 Long term (current) use of anticoagulants: Secondary | ICD-10-CM | POA: Diagnosis not present

## 2018-07-30 DIAGNOSIS — I5032 Chronic diastolic (congestive) heart failure: Secondary | ICD-10-CM | POA: Diagnosis not present

## 2018-07-30 DIAGNOSIS — Z96653 Presence of artificial knee joint, bilateral: Secondary | ICD-10-CM | POA: Insufficient documentation

## 2018-07-30 DIAGNOSIS — Z95810 Presence of automatic (implantable) cardiac defibrillator: Secondary | ICD-10-CM | POA: Insufficient documentation

## 2018-07-30 DIAGNOSIS — Y9389 Activity, other specified: Secondary | ICD-10-CM | POA: Diagnosis not present

## 2018-07-30 DIAGNOSIS — I2510 Atherosclerotic heart disease of native coronary artery without angina pectoris: Secondary | ICD-10-CM | POA: Insufficient documentation

## 2018-07-30 DIAGNOSIS — S8012XA Contusion of left lower leg, initial encounter: Secondary | ICD-10-CM | POA: Diagnosis not present

## 2018-07-30 DIAGNOSIS — Z87891 Personal history of nicotine dependence: Secondary | ICD-10-CM | POA: Insufficient documentation

## 2018-07-30 DIAGNOSIS — I110 Hypertensive heart disease with heart failure: Secondary | ICD-10-CM | POA: Diagnosis not present

## 2018-07-30 DIAGNOSIS — Y998 Other external cause status: Secondary | ICD-10-CM | POA: Diagnosis not present

## 2018-07-30 DIAGNOSIS — Z79899 Other long term (current) drug therapy: Secondary | ICD-10-CM | POA: Diagnosis not present

## 2018-07-30 DIAGNOSIS — Y929 Unspecified place or not applicable: Secondary | ICD-10-CM | POA: Diagnosis not present

## 2018-07-30 DIAGNOSIS — W0110XA Fall on same level from slipping, tripping and stumbling with subsequent striking against unspecified object, initial encounter: Secondary | ICD-10-CM | POA: Diagnosis not present

## 2018-07-30 DIAGNOSIS — S8992XA Unspecified injury of left lower leg, initial encounter: Secondary | ICD-10-CM | POA: Diagnosis present

## 2018-07-30 NOTE — ED Notes (Signed)
Patient transported to X-ray 
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
## 2018-10-09 ENCOUNTER — Ambulatory Visit (INDEPENDENT_AMBULATORY_CARE_PROVIDER_SITE_OTHER): Payer: Medicare Other

## 2018-10-09 ENCOUNTER — Telehealth: Payer: Self-pay

## 2018-10-09 DIAGNOSIS — I442 Atrioventricular block, complete: Secondary | ICD-10-CM

## 2018-10-09 DIAGNOSIS — I428 Other cardiomyopathies: Secondary | ICD-10-CM

## 2018-10-09 NOTE — Telephone Encounter (Signed)
Left message for patient to remind of missed remote transmission.  

## 2018-10-11 ENCOUNTER — Encounter: Payer: Self-pay | Admitting: Cardiology

## 2018-10-11 NOTE — Progress Notes (Signed)
Remote pacemaker transmission.   

## 2018-10-12 LAB — CUP PACEART REMOTE DEVICE CHECK
Battery Voltage: 2.92 V
Brady Statistic AP VS Percent: 0 %
Brady Statistic AS VP Percent: 99.71 %
Date Time Interrogation Session: 20200101230204
Implantable Lead Implant Date: 20061130
Implantable Lead Implant Date: 20061130
Implantable Lead Location: 753858
Implantable Lead Location: 753859
Implantable Lead Location: 753860
Implantable Lead Model: 4194
Implantable Lead Model: 5076
Lead Channel Impedance Value: 399 Ohm
Lead Channel Impedance Value: 570 Ohm
Lead Channel Impedance Value: 627 Ohm
Lead Channel Sensing Intrinsic Amplitude: 1.5 mV
Lead Channel Sensing Intrinsic Amplitude: 1.5 mV
Lead Channel Setting Pacing Pulse Width: 0.4 ms
Lead Channel Setting Sensing Sensitivity: 4 mV
MDC IDC LEAD IMPLANT DT: 20061130
MDC IDC LEAD IMPLANT DT: 20061130
MDC IDC LEAD LOCATION: 753860
MDC IDC MSMT BATTERY REMAINING LONGEVITY: 25 mo
MDC IDC MSMT LEADCHNL LV IMPEDANCE VALUE: 513 Ohm
MDC IDC MSMT LEADCHNL LV IMPEDANCE VALUE: 760 Ohm
MDC IDC MSMT LEADCHNL LV IMPEDANCE VALUE: 817 Ohm
MDC IDC MSMT LEADCHNL RA IMPEDANCE VALUE: 418 Ohm
MDC IDC MSMT LEADCHNL RA IMPEDANCE VALUE: 532 Ohm
MDC IDC MSMT LEADCHNL RV IMPEDANCE VALUE: 627 Ohm
MDC IDC MSMT LEADCHNL RV PACING THRESHOLD AMPLITUDE: 0.375 V
MDC IDC MSMT LEADCHNL RV PACING THRESHOLD PULSEWIDTH: 0.4 ms
MDC IDC PG IMPLANT DT: 20140717
MDC IDC SET LEADCHNL LV PACING AMPLITUDE: 1.25 V
MDC IDC SET LEADCHNL LV PACING PULSEWIDTH: 0.4 ms
MDC IDC SET LEADCHNL RV PACING AMPLITUDE: 2 V
MDC IDC STAT BRADY AP VP PERCENT: 0 %
MDC IDC STAT BRADY AS VS PERCENT: 0.29 %
MDC IDC STAT BRADY RA PERCENT PACED: 0 %
MDC IDC STAT BRADY RV PERCENT PACED: 99.8 %

## 2018-10-15 ENCOUNTER — Telehealth: Payer: Self-pay | Admitting: Internal Medicine

## 2018-10-15 NOTE — Telephone Encounter (Signed)
New Message     Per our conversation earlier patient needs yearly DOT letter and he will pick it up whenever you have it ready for him.

## 2018-10-15 NOTE — Telephone Encounter (Signed)
Completed and Pt picked up.

## 2018-11-20 NOTE — Progress Notes (Deleted)
Electrophysiology Office Note Date: 11/20/2018  ID:  [name], [name] 1946-08-06, MRN [id number]  PCP: Marton Redwood, MD Primary Cardiologist: End -> [place] Electrophysiologist: [place]  CC: Pacemaker follow-up  [name] is a 73 y.o. male seen today for Dr Rayann Heman.  He presents today for routine electrophysiology followup.  Since last being seen in our clinic, the patient reports doing very well.  He denies chest pain, palpitations, dyspnea, PND, orthopnea, nausea, vomiting, dizziness, syncope, edema, weight gain, or early satiety.  Device History: MDT CRTD implanted 2007, gen change 2010, downgrade to MDT CRTP 2014    Past Medical History:  Diagnosis Date  . AAA (abdominal aortic aneurysm) ([place])   . Arthritis   . Atrial fibrillation ([place])    permanent  . CAD (coronary artery disease)   . Chronic systolic dysfunction of left ventricle    s/p BiV ICD//downgrade to CRT-P (medtronic) on 04-25-2013 by Dr Rayann Heman  . Colon polyps   . Complete heart block (HCC)    s/p AV nodal ablation for afib  . Dysphagia   . Elevated LFTs   . Fatigue   . GERD (gastroesophageal reflux disease)   . Gout   . Hemochromatosis   . Hyperlipidemia   . IFG (impaired fasting glucose)   . Osteoarthritis   . Sleep apnea    Past Surgical History:  Procedure Laterality Date  . AV NODE ABLATION    . BIV PACEMAKER GENERATOR CHANGE OUT  04-25-2013   downgrade of previously implanted CRTD to Medtronic CRTP by Dr Rayann Heman.  Previously implanted LV and 4076 RV leads were used  . CARDIAC DEFIBRILLATOR PLACEMENT  2007, 2010   MDT BiV ICD,  [phone] RV lead, though pace/ sense is a Medtronic model 4076 - 58  lead; downgraded to CRTP on 04-25-2013 by Dr Rayann Heman  . HAND SURGERY     left  . HERNIA REPAIR    . HYDROCELE EXCISION    . IMPLANTABLE CARDIOVERTER DEFIBRILLATOR GENERATOR CHANGE N/A 04/25/2013   Procedure: IMPLANTABLE CARDIOVERTER DEFIBRILLATOR GENERATOR CHANGE;  Surgeon: Thompson Grayer, MD;  Location: [facility]  CATH LAB;  Service: Cardiovascular;  Laterality: N/A;  . PACEMAKER INSERTION  2006  . REPLACEMENT TOTAL KNEE  2000, 2011 x2   bilateral    Current Outpatient Medications  Medication Sig Dispense Refill  . carvedilol (COREG) 25 MG tablet Take 12.5 mg by mouth 2 (two) times daily.    . furosemide (LASIX) 20 MG tablet Take 1 tablet (20 mg total) by mouth daily. 90 tablet 3  . Rivaroxaban (XARELTO) 20 MG TABS tablet Take 1 tablet (20 mg total) by mouth daily. 30 tablet 3  . simvastatin (ZOCOR) 40 MG tablet Take 20 mg by mouth every evening.      Current Facility-Administered Medications  Medication Dose Route Frequency Provider Last Rate Last Dose  . 0.9 %  sodium chloride infusion  500 mL Intravenous Continuous Irene Shipper, MD        Allergies:   Lisinopril and Niacin   Social History: Social History   Socioeconomic History  . Marital status: Married    Spouse name: Not on file  . Number of children: 2  . Years of education: Not on file  . Highest education level: Not on file  Occupational History  . Occupation: Geographical information systems officer  . Financial resource strain: Not on file  . Food insecurity:    Worry: Not on file    Inability: Not on file  .  Transportation needs:    Medical: Not on file    Non-medical: Not on file  Tobacco Use  . Smoking status: Former Smoker    Packs/day: 1.00    Years: 8.00    Pack years: 8.00    Types: Cigarettes    Last attempt to quit: 10/10/1997    Years since quitting: 21.1  . Smokeless tobacco: Never Used  Substance and Sexual Activity  . Alcohol use: Yes    Comment: a 6 pack of beer a week, three glasses of wine a week  . Drug use: No  . Sexual activity: Yes  Lifestyle  . Physical activity:    Days per week: Not on file    Minutes per session: Not on file  . Stress: Not on file  Relationships  . Social connections:    Talks on phone: Not on file    Gets together: Not on file    Attends religious service: Not on file    Active  member of club or organization: Not on file    Attends meetings of clubs or organizations: Not on file    Relationship status: Not on file  . Intimate partner violence:    Fear of current or ex partner: Not on file    Emotionally abused: Not on file    Physically abused: Not on file    Forced sexual activity: Not on file  Other Topics Concern  . Not on file  Social History Narrative  . Not on file    Family History: Family History  Problem Relation Age of Onset  . Emphysema Father        smoker  . Arthritis Father   . Bone cancer Maternal Uncle   . Brain cancer Brother        step-brother  . Breast cancer Cousin        maternal cousin  . Colon cancer Neg Hx      Review of Systems: All other systems reviewed and are otherwise negative except as noted above.   Physical Exam: VS:  There were no vitals taken for this visit. , BMI There is no height or weight on file to calculate BMI.  GEN- The patient is well appearing, alert and oriented x 3 today.   HEENT: normocephalic, atraumatic; sclera clear, conjunctiva pink; hearing intact; oropharynx clear; neck supple  Lungs- Clear to ausculation bilaterally, normal work of breathing.  No wheezes, rales, rhonchi Heart- Regular rate and rhythm, no murmurs, rubs or gallops  GI- soft, non-tender, non-distended, bowel sounds present  Extremities- no clubbing, cyanosis, or edema  MS- no significant deformity or atrophy Skin- warm and dry, no rash or lesion; PPM pocket well healed Psych- euthymic mood, full affect Neuro- strength and sensation are intact  PPM Interrogation- reviewed in detail today,  See PACEART report  EKG:  EKG is not ordered today.  Recent Labs: 06/05/2018: B Natriuretic Peptide 78.7; BUN 16; Creatinine, Ser 1.13; Hemoglobin 13.9; Platelets 187; Potassium 3.8; Sodium 141   Wt Readings from Last 3 Encounters:  07/30/18 260 lb (117.9 kg)  07/05/18 259 lb (117.5 kg)  06/05/18 250 lb (113.4 kg)     Other  studies Reviewed: Additional studies/ records that were reviewed today include: Dr Jackalyn Lombard office notes   Assessment and Plan:  1.  Complete heart block s/p AVN ablation Normal PPM function See Pace Art report No changes today  2.  Permanent atrial fibrillation Continue Xarelto for CHADS2VASC of 2 CBC, BMET today  3.  OSA Compliant with CPAP  4.  NICM EF normalized post CRT Euvolemic on exam Continue current therapy    Current medicines are reviewed at length with the patient today.   The patient does not have concerns regarding his medicines.  The following changes were made today:  none  Labs/ tests ordered today include: BMET, CBC No orders of the defined types were placed in this encounter.    Disposition:   Follow up with [facility], Dr Rayann Heman 1 year, Dr Marlou Porch as scheduled    Signed, Chanetta Marshall, NP 11/20/2018 12:17 PM  [facility] [address] [place] [address] [phone] (office) [phone] (fax)

## 2018-11-21 ENCOUNTER — Encounter: Payer: Medicare Other | Admitting: Nurse Practitioner

## 2019-01-10 ENCOUNTER — Ambulatory Visit (INDEPENDENT_AMBULATORY_CARE_PROVIDER_SITE_OTHER): Payer: Medicare Other | Admitting: *Deleted

## 2019-01-10 ENCOUNTER — Other Ambulatory Visit: Payer: Self-pay

## 2019-01-10 DIAGNOSIS — I442 Atrioventricular block, complete: Secondary | ICD-10-CM | POA: Diagnosis not present

## 2019-01-11 ENCOUNTER — Telehealth: Payer: Self-pay

## 2019-01-11 LAB — CUP PACEART REMOTE DEVICE CHECK
Battery Remaining Longevity: 21 mo
Battery Voltage: 2.91 V
Brady Statistic AP VP Percent: 0 %
Brady Statistic AP VS Percent: 0 %
Brady Statistic AS VP Percent: 98.81 %
Brady Statistic AS VS Percent: 1.19 %
Brady Statistic RA Percent Paced: 0 %
Brady Statistic RV Percent Paced: 99.18 %
Date Time Interrogation Session: 20200403153732
Implantable Lead Implant Date: 20061130
Implantable Lead Implant Date: 20061130
Implantable Lead Implant Date: 20061130
Implantable Lead Implant Date: 20061130
Implantable Lead Location: 753858
Implantable Lead Location: 753859
Implantable Lead Location: 753860
Implantable Lead Location: 753860
Implantable Lead Model: 4076
Implantable Lead Model: 4194
Implantable Lead Model: 5076
Implantable Lead Model: 6949
Implantable Pulse Generator Implant Date: 20140717
Lead Channel Impedance Value: 380 Ohm
Lead Channel Impedance Value: 399 Ohm
Lead Channel Impedance Value: 494 Ohm
Lead Channel Impedance Value: 494 Ohm
Lead Channel Impedance Value: 570 Ohm
Lead Channel Impedance Value: 589 Ohm
Lead Channel Impedance Value: 627 Ohm
Lead Channel Impedance Value: 703 Ohm
Lead Channel Impedance Value: 760 Ohm
Lead Channel Pacing Threshold Amplitude: 0.375 V
Lead Channel Pacing Threshold Pulse Width: 0.4 ms
Lead Channel Sensing Intrinsic Amplitude: 0.625 mV
Lead Channel Sensing Intrinsic Amplitude: 0.625 mV
Lead Channel Setting Pacing Amplitude: 1.25 V
Lead Channel Setting Pacing Amplitude: 2 V
Lead Channel Setting Pacing Pulse Width: 0.4 ms
Lead Channel Setting Pacing Pulse Width: 0.4 ms
Lead Channel Setting Sensing Sensitivity: 4 mV

## 2019-01-11 NOTE — Telephone Encounter (Signed)
Left message for patient to remind of missed remote transmission.  

## 2019-01-17 ENCOUNTER — Encounter: Payer: Self-pay | Admitting: Cardiology

## 2019-01-17 NOTE — Progress Notes (Signed)
Remote pacemaker transmission.   

## 2019-03-06 ENCOUNTER — Telehealth: Payer: Self-pay

## 2019-03-06 ENCOUNTER — Ambulatory Visit (INDEPENDENT_AMBULATORY_CARE_PROVIDER_SITE_OTHER): Payer: Medicare Other | Admitting: Cardiology

## 2019-03-06 ENCOUNTER — Encounter: Payer: Self-pay | Admitting: Cardiology

## 2019-03-06 ENCOUNTER — Other Ambulatory Visit: Payer: Self-pay

## 2019-03-06 VITALS — BP 122/80 | HR 67 | Ht 77.0 in | Wt 258.0 lb

## 2019-03-06 DIAGNOSIS — I428 Other cardiomyopathies: Secondary | ICD-10-CM | POA: Diagnosis not present

## 2019-03-06 DIAGNOSIS — E785 Hyperlipidemia, unspecified: Secondary | ICD-10-CM

## 2019-03-06 DIAGNOSIS — I4821 Permanent atrial fibrillation: Secondary | ICD-10-CM | POA: Diagnosis not present

## 2019-03-06 NOTE — Progress Notes (Signed)
Cardiology Office Note:    Date:  03/06/2019   ID:  BARTT GONZAGA, DOB [birth year], MRN [id number]  PCP:  Marton Redwood, MD  Cardiologist:  Candee Furbish, MD  Electrophysiologist:  None   Referring MD: Marton Redwood, MD     History of Present Illness:    [name] is a 73 y.o. male with  nonischemic cardiomyopathy (clean cath in 2004, negative stress test in 2009), chronic atrial fibrillation status post AV node ablation and biventricular pacemaker, hypertension, hyperlipidemia, obstructive sleep apnea on CPAP, and GERD, who presents for follow-up of cardiomyopathy.  I last saw Mr. Dissinger in May, at which time he continued to experience exertional dyspnea and generalized fatigue, particularly in the evenings.  Subsequent echocardiogram showed LVEF of 50% with evidence of mildly to moderately elevated pulmonary artery and central venous pressures.  We therefore increased furosemide to see if this would improve his symptoms.  He was feeling at the last time he saw Dr. Saunders Revel about the same.  Still having exertional dyspnea present for over 15 years.   No real changes today.  Overall he is felt stable without any difficulties.  Past Medical History:  Diagnosis Date  . AAA (abdominal aortic aneurysm) ([place])   . Arthritis   . Atrial fibrillation ([place])    permanent  . CAD (coronary artery disease)   . Chronic systolic dysfunction of left ventricle    s/p BiV ICD//downgrade to CRT-P (medtronic) on 04-25-2013 by Dr Rayann Heman  . Colon polyps   . Complete heart block (HCC)    s/p AV nodal ablation for afib  . Dysphagia   . Elevated LFTs   . Fatigue   . GERD (gastroesophageal reflux disease)   . Gout   . Hemochromatosis   . Hyperlipidemia   . IFG (impaired fasting glucose)   . Osteoarthritis   . Sleep apnea     Past Surgical History:  Procedure Laterality Date  . AV NODE ABLATION    . BIV PACEMAKER GENERATOR CHANGE OUT  04-25-2013   downgrade of previously implanted CRTD to Medtronic  CRTP by Dr Rayann Heman.  Previously implanted LV and 4076 RV leads were used  . CARDIAC DEFIBRILLATOR PLACEMENT  2007, 2010   MDT BiV ICD,  [phone] RV lead, though pace/ sense is a Medtronic model 4076 - 58  lead; downgraded to CRTP on 04-25-2013 by Dr Rayann Heman  . HAND SURGERY     left  . HERNIA REPAIR    . HYDROCELE EXCISION    . IMPLANTABLE CARDIOVERTER DEFIBRILLATOR GENERATOR CHANGE N/A 04/25/2013   Procedure: IMPLANTABLE CARDIOVERTER DEFIBRILLATOR GENERATOR CHANGE;  Surgeon: Thompson Grayer, MD;  Location: [facility] C[facility] LAB;  Service: Cardiovascular;  Laterality: N/A;  . PACEMAKER INSERTION  2006  . REPLACEMENT TOTAL KNEE  2000, 2011 x2   bilateral    Current Medications: Current Meds  Medication Sig  . carvedilol (COREG) 25 MG tablet Take 12.5 mg by mouth 2 (two) times daily.  . furosemide (LASIX) 20 MG tablet Take 1 tablet (20 mg total) by mouth daily.  . Rivaroxaban (XARELTO) 20 MG TABS tablet Take 1 tablet (20 mg total) by mouth daily.  . simvastatin (ZOCOR) 40 MG tablet Take 20 mg by mouth every evening.    Current Facility-Administered Medications for the 03/06/19 encounter (Office Visit) with Jerline Pain, MD  Medication  . 0.9 %  sodium chloride infusion     Allergies:   Lisinopril and Niacin   Social History   Socioeconomic History  .  Marital status: Married    Spouse name: Not on file  . Number of children: 2  . Years of education: Not on file  . Highest education level: Not on file  Occupational History  . Occupation: Geographical information systems officer  . Financial resource strain: Not on file  . Food insecurity:    Worry: Not on file    Inability: Not on file  . Transportation needs:    Medical: Not on file    Non-medical: Not on file  Tobacco Use  . Smoking status: Former Smoker    Packs/day: 1.00    Years: 8.00    Pack years: 8.00    Types: Cigarettes    Last attempt to quit: 10/10/1997    Years since quitting: 21.4  . Smokeless tobacco: Never Used  Substance and Sexual Activity   . Alcohol use: Yes    Comment: a 6 pack of beer a week, three glasses of wine a week  . Drug use: No  . Sexual activity: Yes  Lifestyle  . Physical activity:    Days per week: Not on file    Minutes per session: Not on file  . Stress: Not on file  Relationships  . Social connections:    Talks on phone: Not on file    Gets together: Not on file    Attends religious service: Not on file    Active member of club or organization: Not on file    Attends meetings of clubs or organizations: Not on file    Relationship status: Not on file  Other Topics Concern  . Not on file  Social History Narrative  . Not on file     Family History: The patient's family history includes Arthritis in his father; Bone cancer in his maternal uncle; Brain cancer in his brother; Breast cancer in his cousin; Emphysema in his father. There is no history of Colon cancer.  ROS:   Please see the history of present illness.    Denies any fevers chills nausea vomiting syncope bleeding all other systems reviewed and are negative.  EKGs/Labs/Other Studies Reviewed:    The following studies were reviewed today: 02/16/2018-echo EF 50%.  Excellent.  EKG:   07/05/2018.  Atrial fib with ventricular pacing.  Recent Labs: 06/05/2018: B Natriuretic Peptide 78.7; BUN 16; Creatinine, Ser 1.13; Hemoglobin 13.9; Platelets 187; Potassium 3.8; Sodium 141  Recent Lipid Panel    Component Value Date/Time   CHOL 120 01/08/2015 0843   TRIG 55.0 01/08/2015 0843   HDL 50.70 01/08/2015 0843   CHOLHDL 2 01/08/2015 0843   VLDL 11.0 01/08/2015 0843   LDLCALC 58 01/08/2015 0843    Physical Exam:    VS:  BP 122/80   Pulse 67   Ht 6\' 5"  (1.956 m)   Wt 258 lb (117 kg)   SpO2 95%   BMI 30.59 kg/m     Wt Readings from Last 3 Encounters:  03/06/19 258 lb (117 kg)  07/30/18 260 lb (117.9 kg)  07/05/18 259 lb (117.5 kg)     GEN:  Well nourished, well developed in no acute distress HEENT: Normal NECK: No JVD; No carotid  bruits LYMPHATICS: No lymphadenopathy CARDIAC: RRR, no murmurs, rubs, gallops RESPIRATORY:  Clear to auscultation without rales, wheezing or rhonchi  ABDOMEN: Soft, non-tender, non-distended MUSCULOSKELETAL:  No edema; No deformity  SKIN: Warm and dry NEUROLOGIC:  Alert and oriented x 3 PSYCHIATRIC:  Normal affect   ASSESSMENT:    1. NICM (nonischemic  cardiomyopathy) ([place])   2. Permanent atrial fibrillation   3. Hyperlipidemia, unspecified hyperlipidemia type    PLAN:    In order of problems listed above:  Nonischemic cardiomyopathy - Minimal edema.  Well compensated, NYHA class II.  Chronic dyspnea for over 15 years unchanged.  He has been stable for many years so deferred possible heart catheterization.  Low-dose furosemide.  Low normal EF most recently.  Low normal blood pressure.  Doing well.  We will go ahead and check lab work today.  Permanent atrial fibrillation - AV node ablation pacemaker, continue Xarelto and follow-up with EP.  Chronic anticoagulation - Check lab work with [place].  No bleeding.  Hyperlipidemia -On simvastatin.  Checking labs.   Medication Adjustments/Labs and Tests Ordered: Current medicines are reviewed at length with the patient today.  Concerns regarding medicines are outlined above.  Orders Placed This Encounter  Procedures  . CBC  . Comprehensive metabolic panel  . Lipid panel   No orders of the defined types were placed in this encounter.   Patient Instructions  Medication Instructions:  Your physician recommends that you continue on your current medications as directed. Please refer to the Current Medication list given to you today.  If you need a refill on your cardiac medications before your next appointment, please call your pharmacy.   Lab work: TODAY: CBC, CMET, LIPIDS If you have labs (blood work) drawn today and your tests are completely normal, you will receive your results only by: Marland Kitchen MyChart Message (if you have  MyChart) OR . A paper copy in the mail If you have any lab test that is abnormal or we need to change your treatment, we will call you to review the results.  Testing/Procedures: None ordered  Follow-Up: At [facility], you and your health needs are our priority.  As part of our continuing mission to provide you with exceptional heart care, we have created designated Provider Care Teams.  These Care Teams include your primary Cardiologist (physician) and Advanced Practice Providers (APPs -  Physician Assistants and Nurse Practitioners) who all work together to provide you with the care you need, when you need it. You will need a follow up appointment in 6 months.  Please call our office 2 months in advance to schedule this appointment.  You may see Candee Furbish, MD or one of the following Advanced Practice Providers on your designated Care Team:   Truitt Merle, NP Cecilie Kicks, NP . Kathyrn Drown, NP  Any Other Special Instructions Will Be Listed Below (If Applicable).       Signed, Candee Furbish, MD  03/06/2019 4:19 PM    [facility]

## 2019-03-06 NOTE — Patient Instructions (Signed)
Medication Instructions:  Your physician recommends that you continue on your current medications as directed. Please refer to the Current Medication list given to you today.  If you need a refill on your cardiac medications before your next appointment, please call your pharmacy.   Lab work: TODAY: CBC, CMET, LIPIDS If you have labs (blood work) drawn today and your tests are completely normal, you will receive your results only by: Marland Kitchen MyChart Message (if you have MyChart) OR . A paper copy in the mail If you have any lab test that is abnormal or we need to change your treatment, we will call you to review the results.  Testing/Procedures: None ordered  Follow-Up: At [facility], you and your health needs are our priority.  As part of our continuing mission to provide you with exceptional heart care, we have created designated Provider Care Teams.  These Care Teams include your primary Cardiologist (physician) and Advanced Practice Providers (APPs -  Physician Assistants and Nurse Practitioners) who all work together to provide you with the care you need, when you need it. You will need a follow up appointment in 6 months.  Please call our office 2 months in advance to schedule this appointment.  You may see Candee Furbish, MD or one of the following Advanced Practice Providers on your designated Care Team:   Truitt Merle, NP Cecilie Kicks, NP . Kathyrn Drown, NP  Any Other Special Instructions Will Be Listed Below (If Applicable).

## 2019-03-06 NOTE — Telephone Encounter (Signed)
    COVID-19 Pre-Screening Questions:  . In the past 7 to 10 days have you had a cough,  shortness of breath, headache, congestion, fever (100 or greater) body aches, chills, sore throat, or sudden loss of taste or sense of smell? NO . Have you been around anyone with known Covid 19? NO . Have you been around anyone who is awaiting Covid 19 test results in the past 7 to 10 days? NO . Have you been around anyone who has been exposed to Covid 19, or has mentioned symptoms of Covid 19 within the past 7 to 10 days? NO

## 2019-03-08 ENCOUNTER — Encounter: Payer: Self-pay | Admitting: *Deleted

## 2019-03-08 LAB — COMPREHENSIVE METABOLIC PANEL
ALT: 30 IU/L (ref 0–44)
AST: 45 IU/L — ABNORMAL HIGH (ref 0–40)
Albumin/Globulin Ratio: 2.3 — ABNORMAL HIGH (ref 1.2–2.2)
Albumin: 4.5 g/dL (ref 3.7–4.7)
Alkaline Phosphatase: 86 IU/L (ref 39–117)
BUN/Creatinine Ratio: 15 (ref 10–24)
BUN: 15 mg/dL (ref 8–27)
Bilirubin Total: 0.9 mg/dL (ref 0.0–1.2)
CO2: 24 mmol/L (ref 20–29)
Calcium: 9.5 mg/dL (ref 8.6–10.2)
Chloride: 100 mmol/L (ref 96–106)
Creatinine, Ser: 0.98 mg/dL (ref 0.76–1.27)
GFR calc Af Amer: 89 mL/min/{1.73_m2} (ref >59)
GFR calc non Af Amer: 77 mL/min/{1.73_m2} (ref >59)
Globulin, Total: 2 g/dL (ref 1.5–4.5)
Glucose: 78 mg/dL (ref 65–99)
Potassium: 4.1 mmol/L (ref 3.5–5.2)
Sodium: 141 mmol/L (ref 134–144)
Total Protein: 6.5 g/dL (ref 6.0–8.5)

## 2019-03-08 LAB — LIPID PANEL
Chol/HDL Ratio: 2.1 ratio (ref 0.0–5.0)
Cholesterol, Total: 130 mg/dL (ref 100–199)
HDL: 61 mg/dL (ref >39)
LDL Calculated: 54 mg/dL (ref 0–99)
Triglycerides: 76 mg/dL (ref 0–149)
VLDL Cholesterol Cal: 15 mg/dL (ref 5–40)

## 2019-03-08 LAB — CBC
Hematocrit: 42.7 % (ref 37.5–51.0)
Hemoglobin: 14.4 g/dL (ref 13.0–17.7)
MCH: 33.6 pg — ABNORMAL HIGH (ref 26.6–33.0)
MCHC: 33.7 g/dL (ref 31.5–35.7)
MCV: 100 fL — ABNORMAL HIGH (ref 79–97)
Platelets: 210 10*3/uL (ref 150–450)
RBC: 4.28 x10E6/uL (ref 4.14–5.80)
RDW: 12.5 % (ref 11.6–15.4)
WBC: 6.2 10*3/uL (ref 3.4–10.8)

## 2019-04-11 ENCOUNTER — Ambulatory Visit (INDEPENDENT_AMBULATORY_CARE_PROVIDER_SITE_OTHER): Payer: Medicare Other | Admitting: *Deleted

## 2019-04-11 ENCOUNTER — Telehealth: Payer: Self-pay

## 2019-04-11 DIAGNOSIS — I442 Atrioventricular block, complete: Secondary | ICD-10-CM | POA: Diagnosis not present

## 2019-04-11 NOTE — Telephone Encounter (Signed)
Left message for patient to remind of missed remote transmission.  

## 2019-04-12 LAB — CUP PACEART REMOTE DEVICE CHECK
Battery Remaining Longevity: 19 mo
Battery Voltage: 2.9 V
Brady Statistic AP VP Percent: 0 %
Brady Statistic AP VS Percent: 0 %
Brady Statistic AS VP Percent: 99.19 %
Brady Statistic AS VS Percent: 0.81 %
Brady Statistic RA Percent Paced: 0 %
Brady Statistic RV Percent Paced: 99.43 %
Date Time Interrogation Session: 20200703032015
Implantable Lead Implant Date: 20061130
Implantable Lead Implant Date: 20061130
Implantable Lead Implant Date: 20061130
Implantable Lead Implant Date: 20061130
Implantable Lead Location: 753858
Implantable Lead Location: 753859
Implantable Lead Location: 753860
Implantable Lead Location: 753860
Implantable Lead Model: 4076
Implantable Lead Model: 4194
Implantable Lead Model: 5076
Implantable Lead Model: 6949
Implantable Pulse Generator Implant Date: 20140717
Lead Channel Impedance Value: 380 Ohm
Lead Channel Impedance Value: 418 Ohm
Lead Channel Impedance Value: 494 Ohm
Lead Channel Impedance Value: 513 Ohm
Lead Channel Impedance Value: 570 Ohm
Lead Channel Impedance Value: 608 Ohm
Lead Channel Impedance Value: 627 Ohm
Lead Channel Impedance Value: 703 Ohm
Lead Channel Impedance Value: 779 Ohm
Lead Channel Pacing Threshold Amplitude: 0.375 V
Lead Channel Pacing Threshold Pulse Width: 0.4 ms
Lead Channel Sensing Intrinsic Amplitude: 0.875 mV
Lead Channel Sensing Intrinsic Amplitude: 0.875 mV
Lead Channel Setting Pacing Amplitude: 1.25 V
Lead Channel Setting Pacing Amplitude: 2 V
Lead Channel Setting Pacing Pulse Width: 0.4 ms
Lead Channel Setting Pacing Pulse Width: 0.4 ms
Lead Channel Setting Sensing Sensitivity: 4 mV

## 2019-04-18 ENCOUNTER — Encounter: Payer: Self-pay | Admitting: Cardiology

## 2019-04-18 NOTE — Progress Notes (Signed)
Remote pacemaker transmission.   

## 2019-07-11 ENCOUNTER — Ambulatory Visit (INDEPENDENT_AMBULATORY_CARE_PROVIDER_SITE_OTHER): Payer: Medicare Other | Admitting: *Deleted

## 2019-07-11 DIAGNOSIS — I442 Atrioventricular block, complete: Secondary | ICD-10-CM | POA: Diagnosis not present

## 2019-07-13 LAB — CUP PACEART REMOTE DEVICE CHECK
Battery Remaining Longevity: 16 mo
Battery Voltage: 2.89 V
Brady Statistic AP VP Percent: 0 %
Brady Statistic AP VS Percent: 0 %
Brady Statistic AS VP Percent: 99.78 %
Brady Statistic AS VS Percent: 0.22 %
Brady Statistic RA Percent Paced: 0 %
Brady Statistic RV Percent Paced: 99.83 %
Date Time Interrogation Session: 20201003012026
Implantable Lead Implant Date: 20061130
Implantable Lead Implant Date: 20061130
Implantable Lead Implant Date: 20061130
Implantable Lead Implant Date: 20061130
Implantable Lead Location: 753858
Implantable Lead Location: 753859
Implantable Lead Location: 753860
Implantable Lead Location: 753860
Implantable Lead Model: 4076
Implantable Lead Model: 4194
Implantable Lead Model: 5076
Implantable Lead Model: 6949
Implantable Pulse Generator Implant Date: 20140717
Lead Channel Impedance Value: 380 Ohm
Lead Channel Impedance Value: 399 Ohm
Lead Channel Impedance Value: 494 Ohm
Lead Channel Impedance Value: 494 Ohm
Lead Channel Impedance Value: 551 Ohm
Lead Channel Impedance Value: 608 Ohm
Lead Channel Impedance Value: 627 Ohm
Lead Channel Impedance Value: 722 Ohm
Lead Channel Impedance Value: 798 Ohm
Lead Channel Pacing Threshold Amplitude: 0.375 V
Lead Channel Pacing Threshold Pulse Width: 0.4 ms
Lead Channel Sensing Intrinsic Amplitude: 0.75 mV
Lead Channel Sensing Intrinsic Amplitude: 0.75 mV
Lead Channel Setting Pacing Amplitude: 1.25 V
Lead Channel Setting Pacing Amplitude: 2 V
Lead Channel Setting Pacing Pulse Width: 0.4 ms
Lead Channel Setting Pacing Pulse Width: 0.4 ms
Lead Channel Setting Sensing Sensitivity: 4 mV

## 2019-07-17 ENCOUNTER — Encounter: Payer: Self-pay | Admitting: Cardiology

## 2019-07-17 NOTE — Progress Notes (Signed)
Remote pacemaker transmission.   

## 2019-10-07 ENCOUNTER — Telehealth: Payer: Self-pay | Admitting: Cardiology

## 2019-10-07 NOTE — Telephone Encounter (Signed)
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

## 2019-10-07 NOTE — Telephone Encounter (Signed)
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  Letter last year was completed by Dr. Rayann Heman.  Patient last seen by Dr. Marlou Porch 03/06/19, he has not seen Dr. Rayann Heman this year. Patient needs letter by 10/17/19, next OV scheduled for 10/18/19 with Dr. Marlou Porch.

## 2019-10-10 ENCOUNTER — Ambulatory Visit (INDEPENDENT_AMBULATORY_CARE_PROVIDER_SITE_OTHER): Payer: Medicare Other | Admitting: *Deleted

## 2019-10-10 DIAGNOSIS — I4821 Permanent atrial fibrillation: Secondary | ICD-10-CM

## 2019-10-10 LAB — CUP PACEART REMOTE DEVICE CHECK
Battery Remaining Longevity: 14 mo
Battery Voltage: 2.88 V
Brady Statistic AP VP Percent: 0 %
Brady Statistic AP VS Percent: 0 %
Brady Statistic AS VP Percent: 99.86 %
Brady Statistic AS VS Percent: 0.14 %
Brady Statistic RA Percent Paced: 0 %
Brady Statistic RV Percent Paced: 99.9 %
Date Time Interrogation Session: 20201231143850
Implantable Lead Implant Date: 20061130
Implantable Lead Implant Date: 20061130
Implantable Lead Implant Date: 20061130
Implantable Lead Implant Date: 20061130
Implantable Lead Location: 753858
Implantable Lead Location: 753859
Implantable Lead Location: 753860
Implantable Lead Location: 753860
Implantable Lead Model: 4076
Implantable Lead Model: 4194
Implantable Lead Model: 5076
Implantable Lead Model: 6949
Implantable Pulse Generator Implant Date: 20140717
Lead Channel Impedance Value: 418 Ohm
Lead Channel Impedance Value: 437 Ohm
Lead Channel Impedance Value: 532 Ohm
Lead Channel Impedance Value: 532 Ohm
Lead Channel Impedance Value: 551 Ohm
Lead Channel Impedance Value: 608 Ohm
Lead Channel Impedance Value: 665 Ohm
Lead Channel Impedance Value: 760 Ohm
Lead Channel Impedance Value: 855 Ohm
Lead Channel Pacing Threshold Amplitude: 0.25 V
Lead Channel Pacing Threshold Pulse Width: 0.4 ms
Lead Channel Sensing Intrinsic Amplitude: 1.375 mV
Lead Channel Sensing Intrinsic Amplitude: 1.375 mV
Lead Channel Setting Pacing Amplitude: 1.25 V
Lead Channel Setting Pacing Amplitude: 2 V
Lead Channel Setting Pacing Pulse Width: 0.4 ms
Lead Channel Setting Pacing Pulse Width: 0.4 ms
Lead Channel Setting Sensing Sensitivity: 4 mV

## 2019-10-15 ENCOUNTER — Encounter: Payer: Self-pay | Admitting: *Deleted

## 2019-10-15 NOTE — Telephone Encounter (Signed)
Letter has been completed.  Pt is aware and will come pick in it from the front desk in the morning.

## 2019-10-15 NOTE — Telephone Encounter (Signed)
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  Candee Furbish, MD

## 2019-10-18 ENCOUNTER — Encounter: Payer: Self-pay | Admitting: Cardiology

## 2019-10-18 ENCOUNTER — Other Ambulatory Visit: Payer: Self-pay

## 2019-10-18 ENCOUNTER — Ambulatory Visit: Payer: PPO | Admitting: Cardiology

## 2019-10-18 VITALS — BP 122/74 | HR 64 | Ht 77.0 in | Wt 251.0 lb

## 2019-10-18 DIAGNOSIS — I10 Essential (primary) hypertension: Secondary | ICD-10-CM

## 2019-10-18 DIAGNOSIS — Z950 Presence of cardiac pacemaker: Secondary | ICD-10-CM

## 2019-10-18 DIAGNOSIS — I442 Atrioventricular block, complete: Secondary | ICD-10-CM | POA: Diagnosis not present

## 2019-10-18 DIAGNOSIS — I4821 Permanent atrial fibrillation: Secondary | ICD-10-CM

## 2019-10-18 DIAGNOSIS — I428 Other cardiomyopathies: Secondary | ICD-10-CM

## 2019-10-18 NOTE — Patient Instructions (Signed)
Medication Instructions:  The current medical regimen is effective;  continue present plan and medications.  *If you need a refill on your cardiac medications before your next appointment, please call your pharmacy*  Follow-Up: At [facility], you and your health needs are our priority.  As part of our continuing mission to provide you with exceptional heart care, we have created designated Provider Care Teams.  These Care Teams include your primary Cardiologist (physician) and Advanced Practice Providers (APPs -  Physician Assistants and Nurse Practitioners) who all work together to provide you with the care you need, when you need it.  Your next appointment:   12 month(s)  The format for your next appointment:   In Person  Provider:   Candee Furbish, MD  Other Instructions Thank you for choosing [facility]!!

## 2019-10-18 NOTE — Progress Notes (Signed)
Cardiology Office Note:    Date:  10/18/2019   ID:  RONNALD Ibarra, DOB Jan 30, 1946, MRN [id number]  PCP:  Marton Redwood, MD  Cardiologist:  Candee Furbish, MD  Electrophysiologist:  None   Referring MD: Marton Redwood, MD     History of Present Illness:    [name] is a 74 y.o. male with  nonischemic cardiomyopathy (clean cath in 2004, negative stress test in 2009), chronic atrial fibrillation status post AV node ablation and biventricular pacemaker, hypertension, hyperlipidemia, obstructive sleep apnea on CPAP, and GERD, who presents for follow-up of cardiomyopathy.  I last saw Mr. [name] in May, at which time he continued to experience exertional dyspnea and generalized fatigue, particularly in the evenings.  Subsequent echocardiogram showed LVEF of 50% with evidence of mildly to moderately elevated pulmonary artery and central venous pressures.  We therefore increased furosemide to see if this would improve his symptoms.  He was feeling at the last time he saw Dr. Saunders Revel about the same.  Still having exertional dyspnea present for over 15 years.   10/18/2019-here for follow-up of nonischemic cardiomyopathy with normal catheterization in 2004.  Has atrial fibrillation post AV node ablation with biventricular pacer.  CPAP in place.  EF was 50%, last creatinine 0.9 LDL 54 hemoglobin 14.4 on 03/06/2019.  He is going to be seeing Dr. Manuella Ghazi soon in March.    Past Medical History:  Diagnosis Date  . AAA (abdominal aortic aneurysm) ([place])   . Arthritis   . Atrial fibrillation ([place])    permanent  . CAD (coronary artery disease)   . Chronic systolic dysfunction of left ventricle    s/p BiV ICD//downgrade to CRT-P (medtronic) on 04-25-2013 by Dr Rayann Heman  . Colon polyps   . Complete heart block (HCC)    s/p AV nodal ablation for afib  . Dysphagia   . Elevated LFTs   . Fatigue   . GERD (gastroesophageal reflux disease)   . Gout   . Hemochromatosis   . Hyperlipidemia   . IFG (impaired fasting  glucose)   . Osteoarthritis   . Sleep apnea     Past Surgical History:  Procedure Laterality Date  . AV NODE ABLATION    . BIV PACEMAKER GENERATOR CHANGE OUT  04-25-2013   downgrade of previously implanted CRTD to Medtronic CRTP by Dr Rayann Heman.  Previously implanted LV and 4076 RV leads were used  . CARDIAC DEFIBRILLATOR PLACEMENT  2007, 2010   MDT BiV ICD,  [phone] RV lead, though pace/ sense is a Medtronic model 4076 - 58  lead; downgraded to CRTP on 04-25-2013 by Dr Rayann Heman  . HAND SURGERY     left  . HERNIA REPAIR    . HYDROCELE EXCISION    . IMPLANTABLE CARDIOVERTER DEFIBRILLATOR GENERATOR CHANGE N/A 04/25/2013   Procedure: IMPLANTABLE CARDIOVERTER DEFIBRILLATOR GENERATOR CHANGE;  Surgeon: Thompson Grayer, MD;  Location: [facility] CATH LAB;  Service: Cardiovascular;  Laterality: N/A;  . PACEMAKER INSERTION  2006  . REPLACEMENT TOTAL KNEE  2000, 2011 x2   bilateral    Current Medications: Current Meds  Medication Sig  . carvedilol (COREG) 25 MG tablet Take 12.5 mg by mouth 2 (two) times daily.  . clonazePAM (KLONOPIN) 0.5 MG tablet Take 0.5 mg by mouth as needed for anxiety.  . colchicine 0.6 MG tablet Take 0.6 mg by mouth as needed.  . furosemide (LASIX) 20 MG tablet Take 1 tablet (20 mg total) by mouth daily.  . Rivaroxaban (XARELTO) 20 MG TABS  tablet Take 1 tablet (20 mg total) by mouth daily.  . simvastatin (ZOCOR) 40 MG tablet Take 20 mg by mouth every evening.    Current Facility-Administered Medications for the 10/18/19 encounter (Office Visit) with Jerline Pain, MD  Medication  . 0.9 %  sodium chloride infusion     Allergies:   Lisinopril and Niacin   Social History   Socioeconomic History  . Marital status: Married    Spouse name: Not on file  . Number of children: 2  . Years of education: Not on file  . Highest education level: Not on file  Occupational History  . Occupation: Press photographer  Tobacco Use  . Smoking status: Former Smoker    Packs/day: 1.00    Years: 8.00    Pack  years: 8.00    Types: Cigarettes    Quit date: 10/10/1997    Years since quitting: 22.0  . Smokeless tobacco: Never Used  Substance and Sexual Activity  . Alcohol use: Yes    Comment: a 6 pack of beer a week, three glasses of wine a week  . Drug use: No  . Sexual activity: Yes  Other Topics Concern  . Not on file  Social History Narrative  . Not on file   Social Determinants of Health   Financial Resource Strain:   . Difficulty of Paying Living Expenses: Not on file  Food Insecurity:   . Worried About Charity fundraiser in the Last Year: Not on file  . Ran Out of Food in the Last Year: Not on file  Transportation Needs:   . Lack of Transportation (Medical): Not on file  . Lack of Transportation (Non-Medical): Not on file  Physical Activity:   . Days of Exercise per Week: Not on file  . Minutes of Exercise per Session: Not on file  Stress:   . Feeling of Stress : Not on file  Social Connections:   . Frequency of Communication with Friends and Family: Not on file  . Frequency of Social Gatherings with Friends and Family: Not on file  . Attends Religious Services: Not on file  . Active Member of Clubs or Organizations: Not on file  . Attends Archivist Meetings: Not on file  . Marital Status: Not on file     Family History: The patient's family history includes Arthritis in his father; Bone cancer in his maternal uncle; Brain cancer in his brother; Breast cancer in his cousin; Emphysema in his father. There is no history of Colon cancer.  ROS:   Please see the history of present illness.    Denies any fevers chills nausea vomiting syncope bleeding all other systems reviewed and are negative.  EKGs/Labs/Other Studies Reviewed:    The following studies were reviewed today: 02/16/2018-echo EF 50%.  Excellent.  EKG:   10/18/2019-atrial fibrillation ventricular pacing 64 bpm 07/05/2018.  Atrial fib with ventricular pacing.  Recent Labs: 03/06/2019: ALT 30; BUN 15;  Creatinine, Ser 0.98; Hemoglobin 14.4; Platelets 210; Potassium 4.1; Sodium 141  Recent Lipid Panel    Component Value Date/Time   CHOL 130 03/06/2019 1404   TRIG 76 03/06/2019 1404   HDL 61 03/06/2019 1404   CHOLHDL 2.1 03/06/2019 1404   CHOLHDL 2 01/08/2015 0843   VLDL 11.0 01/08/2015 0843   LDLCALC 54 03/06/2019 1404    Physical Exam:    VS:  BP 122/74   Pulse 64   Ht 6\' 5"  (1.956 m)   Wt 251 lb (113.9  kg)   SpO2 96%   BMI 29.76 kg/m     Wt Readings from Last 3 Encounters:  10/18/19 251 lb (113.9 kg)  03/06/19 258 lb (117 kg)  07/30/18 260 lb (117.9 kg)     GEN: Well nourished, well developed, in no acute distress  HEENT: normal  Neck: no JVD, carotid bruits, or masses Cardiac: RRR; no murmurs, rubs, or gallops,no edema  Respiratory:  clear to auscultation bilaterally, normal work of breathing GI: soft, nontender, nondistended, + BS MS: no deformity or atrophy  Skin: warm and dry, no rash Neuro:  Alert and Oriented x 3, Strength and sensation are intact Psych: euthymic mood, full affect   ASSESSMENT:    1. Permanent atrial fibrillation ([place])   2. Hypertension, unspecified type   3. NICM (nonischemic cardiomyopathy) ([place])   4. Pacemaker   5. Atrioventricular block, complete (HCC)    PLAN:    In order of problems listed above:  Nonischemic cardiomyopathy - Minimal edema.  Well compensated, NYHA class II.  Chronic dyspnea for over 15 years unchanged.  He has been stable for many years.  Low-dose furosemide.  Low normal EF most recently.   No changes made.  Continue carvedilol.  Permanent atrial fibrillation - AV node ablation pacemaker, continue Xarelto and follow-up with EP.  Dr. Rayann Heman has been monitoring.  In December 2020, has 1 year 2 months longevity on his pacemaker.  He is pacemaker dependent.  Chronic anticoagulation - Xarelto.  No bleeding.  Doing well.  No changes.  Hyperlipidemia -On simvastatin.   prior LDL excellent 54   Medication  Adjustments/Labs and Tests Ordered: Current medicines are reviewed at length with the patient today.  Concerns regarding medicines are outlined above.  Orders Placed This Encounter  Procedures  . EKG 12-Lead   No orders of the defined types were placed in this encounter.   Patient Instructions  Medication Instructions:  The current medical regimen is effective;  continue present plan and medications.  *If you need a refill on your cardiac medications before your next appointment, please call your pharmacy*  Follow-Up: At [facility], you and your health needs are our priority.  As part of our continuing mission to provide you with exceptional heart care, we have created designated Provider Care Teams.  These Care Teams include your primary Cardiologist (physician) and Advanced Practice Providers (APPs -  Physician Assistants and Nurse Practitioners) who all work together to provide you with the care you need, when you need it.  Your next appointment:   12 month(s)  The format for your next appointment:   In Person  Provider:   Candee Furbish, MD  Other Instructions Thank you for choosing [facility]!!         Signed, Candee Furbish, MD  10/18/2019 4:04 PM    [place]

## 2019-11-01 DIAGNOSIS — R972 Elevated prostate specific antigen [PSA]: Secondary | ICD-10-CM | POA: Diagnosis not present

## 2019-11-19 DIAGNOSIS — G4733 Obstructive sleep apnea (adult) (pediatric): Secondary | ICD-10-CM | POA: Diagnosis not present

## 2019-12-25 DIAGNOSIS — Z125 Encounter for screening for malignant neoplasm of prostate: Secondary | ICD-10-CM | POA: Diagnosis not present

## 2019-12-25 DIAGNOSIS — E7849 Other hyperlipidemia: Secondary | ICD-10-CM | POA: Diagnosis not present

## 2019-12-25 DIAGNOSIS — R7301 Impaired fasting glucose: Secondary | ICD-10-CM | POA: Diagnosis not present

## 2020-01-01 DIAGNOSIS — D6869 Other thrombophilia: Secondary | ICD-10-CM | POA: Diagnosis not present

## 2020-01-01 DIAGNOSIS — R972 Elevated prostate specific antigen [PSA]: Secondary | ICD-10-CM | POA: Diagnosis not present

## 2020-01-01 DIAGNOSIS — Z0000 Encounter for general adult medical examination without abnormal findings: Secondary | ICD-10-CM | POA: Diagnosis not present

## 2020-01-01 DIAGNOSIS — I509 Heart failure, unspecified: Secondary | ICD-10-CM | POA: Diagnosis not present

## 2020-01-01 DIAGNOSIS — E785 Hyperlipidemia, unspecified: Secondary | ICD-10-CM | POA: Diagnosis not present

## 2020-01-01 DIAGNOSIS — I480 Paroxysmal atrial fibrillation: Secondary | ICD-10-CM | POA: Diagnosis not present

## 2020-01-01 DIAGNOSIS — R1310 Dysphagia, unspecified: Secondary | ICD-10-CM | POA: Diagnosis not present

## 2020-01-01 DIAGNOSIS — Z8616 Personal history of COVID-19: Secondary | ICD-10-CM | POA: Diagnosis not present

## 2020-01-01 DIAGNOSIS — Z1331 Encounter for screening for depression: Secondary | ICD-10-CM | POA: Diagnosis not present

## 2020-01-01 DIAGNOSIS — I2510 Atherosclerotic heart disease of native coronary artery without angina pectoris: Secondary | ICD-10-CM | POA: Diagnosis not present

## 2020-01-01 DIAGNOSIS — L989 Disorder of the skin and subcutaneous tissue, unspecified: Secondary | ICD-10-CM | POA: Diagnosis not present

## 2020-01-01 DIAGNOSIS — R82998 Other abnormal findings in urine: Secondary | ICD-10-CM | POA: Diagnosis not present

## 2020-01-01 DIAGNOSIS — R7301 Impaired fasting glucose: Secondary | ICD-10-CM | POA: Diagnosis not present

## 2020-01-01 DIAGNOSIS — M109 Gout, unspecified: Secondary | ICD-10-CM | POA: Diagnosis not present

## 2020-01-09 ENCOUNTER — Ambulatory Visit (INDEPENDENT_AMBULATORY_CARE_PROVIDER_SITE_OTHER): Payer: PPO | Admitting: *Deleted

## 2020-01-09 DIAGNOSIS — Z1212 Encounter for screening for malignant neoplasm of rectum: Secondary | ICD-10-CM | POA: Diagnosis not present

## 2020-01-09 DIAGNOSIS — I4821 Permanent atrial fibrillation: Secondary | ICD-10-CM

## 2020-01-09 LAB — CUP PACEART REMOTE DEVICE CHECK
Battery Remaining Longevity: 10 mo
Battery Voltage: 2.86 V
Brady Statistic AP VP Percent: 0 %
Brady Statistic AP VS Percent: 0 %
Brady Statistic AS VP Percent: 99.88 %
Brady Statistic AS VS Percent: 0.12 %
Brady Statistic RA Percent Paced: 0 %
Brady Statistic RV Percent Paced: 99.91 %
Date Time Interrogation Session: 20210401143610
Implantable Lead Implant Date: 20061130
Implantable Lead Implant Date: 20061130
Implantable Lead Implant Date: 20061130
Implantable Lead Implant Date: 20061130
Implantable Lead Location: 753858
Implantable Lead Location: 753859
Implantable Lead Location: 753860
Implantable Lead Location: 753860
Implantable Lead Model: 4076
Implantable Lead Model: 4194
Implantable Lead Model: 5076
Implantable Lead Model: 6949
Implantable Pulse Generator Implant Date: 20140717
Lead Channel Impedance Value: 380 Ohm
Lead Channel Impedance Value: 399 Ohm
Lead Channel Impedance Value: 475 Ohm
Lead Channel Impedance Value: 494 Ohm
Lead Channel Impedance Value: 551 Ohm
Lead Channel Impedance Value: 570 Ohm
Lead Channel Impedance Value: 608 Ohm
Lead Channel Impedance Value: 665 Ohm
Lead Channel Impedance Value: 741 Ohm
Lead Channel Pacing Threshold Amplitude: 0.375 V
Lead Channel Pacing Threshold Pulse Width: 0.4 ms
Lead Channel Sensing Intrinsic Amplitude: 1 mV
Lead Channel Sensing Intrinsic Amplitude: 1 mV
Lead Channel Setting Pacing Amplitude: 1.25 V
Lead Channel Setting Pacing Amplitude: 2 V
Lead Channel Setting Pacing Pulse Width: 0.4 ms
Lead Channel Setting Pacing Pulse Width: 0.4 ms
Lead Channel Setting Sensing Sensitivity: 4 mV

## 2020-01-09 NOTE — Progress Notes (Signed)
PPM Remote  

## 2020-02-10 ENCOUNTER — Ambulatory Visit (INDEPENDENT_AMBULATORY_CARE_PROVIDER_SITE_OTHER): Payer: PPO | Admitting: *Deleted

## 2020-02-10 DIAGNOSIS — I5032 Chronic diastolic (congestive) heart failure: Secondary | ICD-10-CM

## 2020-02-10 LAB — CUP PACEART REMOTE DEVICE CHECK
Battery Remaining Longevity: 8 mo
Battery Voltage: 2.86 V
Brady Statistic AP VP Percent: 0 %
Brady Statistic AP VS Percent: 0 %
Brady Statistic AS VP Percent: 99.92 %
Brady Statistic AS VS Percent: 0.08 %
Brady Statistic RA Percent Paced: 0 %
Brady Statistic RV Percent Paced: 99.94 %
Date Time Interrogation Session: 20210503171307
Implantable Lead Implant Date: 20061130
Implantable Lead Implant Date: 20061130
Implantable Lead Implant Date: 20061130
Implantable Lead Implant Date: 20061130
Implantable Lead Location: 753858
Implantable Lead Location: 753859
Implantable Lead Location: 753860
Implantable Lead Location: 753860
Implantable Lead Model: 4076
Implantable Lead Model: 4194
Implantable Lead Model: 5076
Implantable Lead Model: 6949
Implantable Pulse Generator Implant Date: 20140717
Lead Channel Impedance Value: 380 Ohm
Lead Channel Impedance Value: 399 Ohm
Lead Channel Impedance Value: 494 Ohm
Lead Channel Impedance Value: 513 Ohm
Lead Channel Impedance Value: 551 Ohm
Lead Channel Impedance Value: 608 Ohm
Lead Channel Impedance Value: 608 Ohm
Lead Channel Impedance Value: 741 Ohm
Lead Channel Impedance Value: 779 Ohm
Lead Channel Pacing Threshold Amplitude: 0.375 V
Lead Channel Pacing Threshold Pulse Width: 0.4 ms
Lead Channel Sensing Intrinsic Amplitude: 0.875 mV
Lead Channel Sensing Intrinsic Amplitude: 0.875 mV
Lead Channel Setting Pacing Amplitude: 1.25 V
Lead Channel Setting Pacing Amplitude: 2 V
Lead Channel Setting Pacing Pulse Width: 0.4 ms
Lead Channel Setting Pacing Pulse Width: 0.4 ms
Lead Channel Setting Sensing Sensitivity: 4 mV

## 2020-02-11 NOTE — Progress Notes (Signed)
Remote pacemaker transmission.   

## 2020-03-16 ENCOUNTER — Ambulatory Visit (INDEPENDENT_AMBULATORY_CARE_PROVIDER_SITE_OTHER): Payer: PPO | Admitting: *Deleted

## 2020-03-16 DIAGNOSIS — I428 Other cardiomyopathies: Secondary | ICD-10-CM

## 2020-03-17 ENCOUNTER — Telehealth: Payer: Self-pay

## 2020-03-17 NOTE — Telephone Encounter (Signed)
Left message for patient to remind of missed remote transmission.  

## 2020-03-19 LAB — CUP PACEART REMOTE DEVICE CHECK
Battery Remaining Longevity: 8 mo
Battery Voltage: 2.85 V
Brady Statistic AP VP Percent: 0 %
Brady Statistic AP VS Percent: 0 %
Brady Statistic AS VP Percent: 99.91 %
Brady Statistic AS VS Percent: 0.1 %
Brady Statistic RA Percent Paced: 0 %
Brady Statistic RV Percent Paced: 99.93 %
Date Time Interrogation Session: 20210609222330
Implantable Lead Implant Date: 20061130
Implantable Lead Implant Date: 20061130
Implantable Lead Implant Date: 20061130
Implantable Lead Implant Date: 20061130
Implantable Lead Location: 753858
Implantable Lead Location: 753859
Implantable Lead Location: 753860
Implantable Lead Location: 753860
Implantable Lead Model: 4076
Implantable Lead Model: 4194
Implantable Lead Model: 5076
Implantable Lead Model: 6949
Implantable Pulse Generator Implant Date: 20140717
Lead Channel Impedance Value: 380 Ohm
Lead Channel Impedance Value: 399 Ohm
Lead Channel Impedance Value: 494 Ohm
Lead Channel Impedance Value: 494 Ohm
Lead Channel Impedance Value: 551 Ohm
Lead Channel Impedance Value: 608 Ohm
Lead Channel Impedance Value: 627 Ohm
Lead Channel Impedance Value: 722 Ohm
Lead Channel Impedance Value: 798 Ohm
Lead Channel Pacing Threshold Amplitude: 0.375 V
Lead Channel Pacing Threshold Pulse Width: 0.4 ms
Lead Channel Sensing Intrinsic Amplitude: 0.75 mV
Lead Channel Sensing Intrinsic Amplitude: 0.75 mV
Lead Channel Setting Pacing Amplitude: 1.25 V
Lead Channel Setting Pacing Amplitude: 2 V
Lead Channel Setting Pacing Pulse Width: 0.4 ms
Lead Channel Setting Pacing Pulse Width: 0.4 ms
Lead Channel Setting Sensing Sensitivity: 4 mV

## 2020-03-19 NOTE — Progress Notes (Signed)
Remote pacemaker transmission.   

## 2020-03-27 DIAGNOSIS — R972 Elevated prostate specific antigen [PSA]: Secondary | ICD-10-CM | POA: Diagnosis not present

## 2020-04-03 DIAGNOSIS — R972 Elevated prostate specific antigen [PSA]: Secondary | ICD-10-CM | POA: Diagnosis not present

## 2020-04-20 ENCOUNTER — Ambulatory Visit (INDEPENDENT_AMBULATORY_CARE_PROVIDER_SITE_OTHER): Payer: PPO | Admitting: *Deleted

## 2020-04-20 DIAGNOSIS — I428 Other cardiomyopathies: Secondary | ICD-10-CM | POA: Diagnosis not present

## 2020-04-22 LAB — CUP PACEART REMOTE DEVICE CHECK
Battery Remaining Longevity: 6 mo
Battery Voltage: 2.84 V
Brady Statistic AP VP Percent: 0 %
Brady Statistic AP VS Percent: 0 %
Brady Statistic AS VP Percent: 99.91 %
Brady Statistic AS VS Percent: 0.09 %
Brady Statistic RA Percent Paced: 0 %
Brady Statistic RV Percent Paced: 99.93 %
Date Time Interrogation Session: 20210714142030
Implantable Lead Implant Date: 20061130
Implantable Lead Implant Date: 20061130
Implantable Lead Implant Date: 20061130
Implantable Lead Implant Date: 20061130
Implantable Lead Location: 753858
Implantable Lead Location: 753859
Implantable Lead Location: 753860
Implantable Lead Location: 753860
Implantable Lead Model: 4076
Implantable Lead Model: 4194
Implantable Lead Model: 5076
Implantable Lead Model: 6949
Implantable Pulse Generator Implant Date: 20140717
Lead Channel Impedance Value: 361 Ohm
Lead Channel Impedance Value: 380 Ohm
Lead Channel Impedance Value: 475 Ohm
Lead Channel Impedance Value: 475 Ohm
Lead Channel Impedance Value: 551 Ohm
Lead Channel Impedance Value: 551 Ohm
Lead Channel Impedance Value: 608 Ohm
Lead Channel Impedance Value: 665 Ohm
Lead Channel Impedance Value: 722 Ohm
Lead Channel Pacing Threshold Amplitude: 0.375 V
Lead Channel Pacing Threshold Pulse Width: 0.4 ms
Lead Channel Sensing Intrinsic Amplitude: 0.75 mV
Lead Channel Sensing Intrinsic Amplitude: 0.75 mV
Lead Channel Setting Pacing Amplitude: 1.25 V
Lead Channel Setting Pacing Amplitude: 2 V
Lead Channel Setting Pacing Pulse Width: 0.4 ms
Lead Channel Setting Pacing Pulse Width: 0.4 ms
Lead Channel Setting Sensing Sensitivity: 4 mV

## 2020-04-23 NOTE — Progress Notes (Signed)
Remote pacemaker transmission.   

## 2020-05-25 ENCOUNTER — Ambulatory Visit (INDEPENDENT_AMBULATORY_CARE_PROVIDER_SITE_OTHER): Payer: PPO | Admitting: *Deleted

## 2020-05-25 DIAGNOSIS — I428 Other cardiomyopathies: Secondary | ICD-10-CM

## 2020-05-27 LAB — CUP PACEART REMOTE DEVICE CHECK
Battery Remaining Longevity: 5 mo
Battery Voltage: 2.83 V
Brady Statistic RA Percent Paced: 0 %
Brady Statistic RV Percent Paced: 99.92 %
Date Time Interrogation Session: 20210817001649
Implantable Lead Implant Date: 20061130
Implantable Lead Implant Date: 20061130
Implantable Lead Implant Date: 20061130
Implantable Lead Implant Date: 20061130
Implantable Lead Location: 753858
Implantable Lead Location: 753859
Implantable Lead Location: 753860
Implantable Lead Location: 753860
Implantable Lead Model: 4076
Implantable Lead Model: 4194
Implantable Lead Model: 5076
Implantable Lead Model: 6949
Implantable Pulse Generator Implant Date: 20140717
Lead Channel Impedance Value: 361 Ohm
Lead Channel Impedance Value: 380 Ohm
Lead Channel Impedance Value: 456 Ohm
Lead Channel Impedance Value: 475 Ohm
Lead Channel Impedance Value: 532 Ohm
Lead Channel Impedance Value: 570 Ohm
Lead Channel Impedance Value: 589 Ohm
Lead Channel Impedance Value: 665 Ohm
Lead Channel Impedance Value: 722 Ohm
Lead Channel Pacing Threshold Amplitude: 0.375 V
Lead Channel Pacing Threshold Pulse Width: 0.4 ms
Lead Channel Sensing Intrinsic Amplitude: 1 mV
Lead Channel Sensing Intrinsic Amplitude: 1 mV
Lead Channel Setting Pacing Amplitude: 1.25 V
Lead Channel Setting Pacing Amplitude: 2 V
Lead Channel Setting Pacing Pulse Width: 0.4 ms
Lead Channel Setting Pacing Pulse Width: 0.4 ms
Lead Channel Setting Sensing Sensitivity: 4 mV

## 2020-06-01 NOTE — Progress Notes (Signed)
Remote pacemaker transmission.   

## 2020-06-09 DIAGNOSIS — G4733 Obstructive sleep apnea (adult) (pediatric): Secondary | ICD-10-CM | POA: Diagnosis not present

## 2020-06-09 DIAGNOSIS — G4700 Insomnia, unspecified: Secondary | ICD-10-CM | POA: Diagnosis not present

## 2020-06-29 ENCOUNTER — Ambulatory Visit (INDEPENDENT_AMBULATORY_CARE_PROVIDER_SITE_OTHER): Payer: PPO | Admitting: *Deleted

## 2020-06-29 DIAGNOSIS — I428 Other cardiomyopathies: Secondary | ICD-10-CM

## 2020-06-29 DIAGNOSIS — I5032 Chronic diastolic (congestive) heart failure: Secondary | ICD-10-CM

## 2020-06-30 ENCOUNTER — Telehealth: Payer: Self-pay

## 2020-06-30 LAB — CUP PACEART REMOTE DEVICE CHECK
Battery Remaining Longevity: 4 mo
Battery Voltage: 2.82 V
Brady Statistic RA Percent Paced: 0 %
Brady Statistic RV Percent Paced: 99.94 %
Date Time Interrogation Session: 20210921173608
Implantable Lead Implant Date: 20061130
Implantable Lead Implant Date: 20061130
Implantable Lead Implant Date: 20061130
Implantable Lead Implant Date: 20061130
Implantable Lead Location: 753858
Implantable Lead Location: 753859
Implantable Lead Location: 753860
Implantable Lead Location: 753860
Implantable Lead Model: 4076
Implantable Lead Model: 4194
Implantable Lead Model: 5076
Implantable Lead Model: 6949
Implantable Pulse Generator Implant Date: 20140717
Lead Channel Impedance Value: 380 Ohm
Lead Channel Impedance Value: 418 Ohm
Lead Channel Impedance Value: 475 Ohm
Lead Channel Impedance Value: 513 Ohm
Lead Channel Impedance Value: 551 Ohm
Lead Channel Impedance Value: 589 Ohm
Lead Channel Impedance Value: 608 Ohm
Lead Channel Impedance Value: 684 Ohm
Lead Channel Impedance Value: 760 Ohm
Lead Channel Pacing Threshold Amplitude: 0.375 V
Lead Channel Pacing Threshold Pulse Width: 0.4 ms
Lead Channel Sensing Intrinsic Amplitude: 1 mV
Lead Channel Sensing Intrinsic Amplitude: 1 mV
Lead Channel Setting Pacing Amplitude: 1.25 V
Lead Channel Setting Pacing Amplitude: 2 V
Lead Channel Setting Pacing Pulse Width: 0.4 ms
Lead Channel Setting Pacing Pulse Width: 0.4 ms
Lead Channel Setting Sensing Sensitivity: 4 mV

## 2020-06-30 NOTE — Progress Notes (Signed)
Remote pacemaker transmission.   

## 2020-06-30 NOTE — Addendum Note (Signed)
Addended by: Douglass Rivers D on: 06/30/2020 05:05 PM   Modules accepted: Level of Service

## 2020-06-30 NOTE — Telephone Encounter (Signed)
Scheduled remote reviewed on 06/30/20. Noted increase in Optivol/decrease in thoracic impedance starting on 06/07/20.   Med list- Coreg 12.5 mg BID, Lasix 20 mg daily, Xarelto 20 mg daily.   Patient denies any complaints such as shortness of breath or leg swelling. Patient takes Lasix "several times a week." States he travel a lot and it makes him go to the bathroom a lot. Advised patient I will ask Dr. Rayann Heman if there are any other options available for patient.   Patient reports he does not know if he has his Lasix filled, advised he will check this evening and call back, direct phone number provided.   Will forward to [facility]. Routing to Dr. Rayann Heman for review.

## 2020-07-01 NOTE — Telephone Encounter (Signed)
Patient left a vm yesterday at 6pm and states that he is getting a procedure dont at [facility] derm for cancer on top of ppm site. I have called him back as the message wasn't clear as if he needed clearance or not. LVOM for patient to call back with DC #

## 2020-07-01 NOTE — Telephone Encounter (Signed)
Patient states he found Lasix and will resume taking it. States he has ordered more with his pharmacy.   Patient also states he has a possible skin issues (?cancer) unsure, over his PPM site and is concerned about having a gen change by the end of the year with the skin issue. He has apt. With dermatoloy next month. Advised patient to see what dermatology recommends and give [place] a call with update. Advised patient I would send a message to Dr. Rayann Heman regarding his concern. Patient agreeable with plan.

## 2020-07-05 NOTE — Telephone Encounter (Signed)
He is overdue to see me Please schedule in office visit at the next available time. I worry about any biopsies over his device pocket and would like to look at this personally and discuss planning prior to biopsy as this would carry a high risk for device pocket infection.

## 2020-07-13 ENCOUNTER — Other Ambulatory Visit: Payer: Self-pay

## 2020-07-13 ENCOUNTER — Ambulatory Visit (INDEPENDENT_AMBULATORY_CARE_PROVIDER_SITE_OTHER): Payer: PPO | Admitting: Internal Medicine

## 2020-07-13 ENCOUNTER — Encounter: Payer: Self-pay | Admitting: Internal Medicine

## 2020-07-13 VITALS — BP 124/84 | HR 77 | Ht 76.5 in | Wt 246.6 lb

## 2020-07-13 DIAGNOSIS — I442 Atrioventricular block, complete: Secondary | ICD-10-CM

## 2020-07-13 DIAGNOSIS — I428 Other cardiomyopathies: Secondary | ICD-10-CM

## 2020-07-13 DIAGNOSIS — G4733 Obstructive sleep apnea (adult) (pediatric): Secondary | ICD-10-CM | POA: Diagnosis not present

## 2020-07-13 DIAGNOSIS — I4821 Permanent atrial fibrillation: Secondary | ICD-10-CM | POA: Diagnosis not present

## 2020-07-13 NOTE — Progress Notes (Signed)
PCP: Marton Redwood, MD Primary Cardiologist: Dr Marlou Porch Primary EP:  Dr Rayann Heman  [name] is a 74 y.o. male who presents today for routine electrophysiology followup.  Since last being seen in our clinic, the patient reports doing very well.  Today, he denies symptoms of palpitations, chest pain, shortness of breath,  lower extremity edema, dizziness, presyncope, or syncope.  The patient is otherwise without complaint today.   Past Medical History:  Diagnosis Date  . AAA (abdominal aortic aneurysm) ([place])   . Arthritis   . Atrial fibrillation ([place])    permanent  . CAD (coronary artery disease)   . Chronic systolic dysfunction of left ventricle    s/p BiV ICD//downgrade to CRT-P (medtronic) on 04-25-2013 by Dr Rayann Heman  . Colon polyps   . Complete heart block (HCC)    s/p AV nodal ablation for afib  . Dysphagia   . Elevated LFTs   . Fatigue   . GERD (gastroesophageal reflux disease)   . Gout   . Hemochromatosis   . Hyperlipidemia   . IFG (impaired fasting glucose)   . Osteoarthritis   . Sleep apnea    Past Surgical History:  Procedure Laterality Date  . AV NODE ABLATION    . BIV PACEMAKER GENERATOR CHANGE OUT  04-25-2013   downgrade of previously implanted CRTD to Medtronic CRTP by Dr Rayann Heman.  Previously implanted LV and 4076 RV leads were used  . CARDIAC DEFIBRILLATOR PLACEMENT  2007, 2010   MDT BiV ICD,  [phone] RV lead, though pace/ sense is a Medtronic model 4076 - 58  lead; downgraded to CRTP on 04-25-2013 by Dr Rayann Heman  . HAND SURGERY     left  . HERNIA REPAIR    . HYDROCELE EXCISION    . IMPLANTABLE CARDIOVERTER DEFIBRILLATOR GENERATOR CHANGE N/A 04/25/2013   Procedure: IMPLANTABLE CARDIOVERTER DEFIBRILLATOR GENERATOR CHANGE;  Surgeon: Thompson Grayer, MD;  Location[facility] CATH LAB;  Service: Cardiovascular;  Laterality: N/A;  . PACEMAKER INSERTION  2006  . REPLACEMENT TOTAL KNEE  2000, 2011 x2   bilateral    ROS- all systems are reviewed and negative except as per HPI  above  Current Outpatient Medications  Medication Sig Dispense Refill  . carvedilol (COREG) 25 MG tablet Take 12.5 mg by mouth 2 (two) times daily.    . colchicine 0.6 MG tablet Take 0.6 mg by mouth as needed.    . furosemide (LASIX) 20 MG tablet Take 1 tablet (20 mg total) by mouth daily. 90 tablet 3  . Rivaroxaban (XARELTO) 20 MG TABS tablet Take 1 tablet (20 mg total) by mouth daily. 30 tablet 3  . simvastatin (ZOCOR) 40 MG tablet Take 20 mg by mouth every evening.     . traZODone (DESYREL) 50 MG tablet Take 50 mg by mouth at bedtime.     Current Facility-Administered Medications  Medication Dose Route Frequency Provider Last Rate Last Admin  . 0.9 %  sodium chloride infusion  500 mL Intravenous Continuous Irene Shipper, MD        Physical Exam: Vitals:   07/13/20 1525  BP: 124/84  Pulse: 77  SpO2: 94%  Weight: 246 lb 9.6 oz (111.9 kg)  Height: 6' 4.5" (1.943 m)    GEN- The patient is well appearing, alert and oriented x 3 today.   Head- normocephalic, atraumatic Eyes-  Sclera clear, conjunctiva pink Ears- hearing intact Oropharynx- clear Lungs- normal work of breathing Chest- pacemaker pocket is well healed,  There is  a 1cm x 1cm veracious black lesion over the cephalad portion of his device pocket which is chronic.  This does not appear to be pocket infection.  He has had this previously biopsied.  I have again cautioned against biopsies near his device pocket which could lead to infection. Heart- Regular rate and rhythm (paced) GI- soft, NT, ND, + BS Extremities- no clubbing, cyanosis, or edema  Pacemaker interrogation- reviewed in detail today,  See PACEART report  ekg tracing ordered today is personally reviewed and shows Afib, biv paced Echo 02/16/18- EF 50%, mild LVH, moderate to severe LA enlargement, severe RA enlargement  Assessment and Plan:  1. Symptomatic complete heart block Normal BiV pacemaker function See Pace Art report No changes today he is device  dependant today He is approaching ERI (3 months) Risks, benefits, and alternatives to BiV pacemaker pulse generator replacement were discussed in detail today.  The patient understands that risks include but are not limited to bleeding, infection, pneumothorax, perforation, tamponade, vascular damage, renal failure, MI, stroke, death, inappropriate shocks, damage to his existing leads, and lead dislodgement and wishes to proceed.  Once ERI, ok to schedule without an additional office visit required. Hold xarelto 24 hours prior to the procedure.  2. Permanent afib He is on xarelto for stroke prevention  3. OSA Compliant with CPAP  4. Chronic systolic dysfunction EF normalized with CRT.  Risks, benefits and potential toxicities for medications prescribed and/or refilled reviewed with patient today.   Thompson Grayer MD, [facility] 07/13/2020 3:42 PM

## 2020-07-13 NOTE — Patient Instructions (Addendum)
Medication Instructions:  Your physician recommends that you continue on your current medications as directed. Please refer to the Current Medication list given to you today.  *If you need a refill on your cardiac medications before your next appointment, please call your pharmacy*  Lab Work: None ordered.  If you have labs (blood work) drawn today and your tests are completely normal, you will receive your results only by: Marland Kitchen MyChart Message (if you have MyChart) OR . A paper copy in the mail If you have any lab test that is abnormal or we need to change your treatment, we will call you to review the results.  Testing/Procedures: None ordered.  Follow-Up: At [facility], you and your health needs are our priority.  As part of our continuing mission to provide you with exceptional heart care, we have created designated Provider Care Teams.  These Care Teams include your primary Cardiologist (physician) and Advanced Practice Providers (APPs -  Physician Assistants and Nurse Practitioners) who all work together to provide you with the care you need, when you need it.  We recommend signing up for the patient portal called "MyChart".  Sign up information is provided on this After Visit Summary.  MyChart is used to connect with patients for Virtual Visits (Telemedicine).  Patients are able to view lab/test results, encounter notes, upcoming appointments, etc.  Non-urgent messages can be sent to your provider as well.   To learn more about what you can do with MyChart, go to [email or web address].    Your next appointment:   Your physician wants you to follow-up in: once you reach ERI we will reach out to schedule.  Remote monitoring is used to monitor your Pacemaker from home. This monitoring reduces the number of office visits required to check your device to one time per year. It allows [place] to keep an eye on the functioning of your device to ensure it is working properly. You are scheduled  for a device check from home on 08/03/20. You may send your transmission at any time that day. If you have a wireless device, the transmission will be sent automatically. After your physician reviews your transmission, you will receive a postcard with your next transmission date.  Other Instructions:

## 2020-07-14 NOTE — Telephone Encounter (Signed)
After speaking to the patient it appears we have the wrong dose in epic for his simvastatin prescribed by his PCP Brigitte Pulse. Will update.   Advised device clinic will get back to him regarding his hearing aid.

## 2020-07-17 NOTE — Addendum Note (Signed)
Addended by: Rose Phi on: 07/17/2020 03:26 PM   Modules accepted: Orders

## 2020-07-23 NOTE — Telephone Encounter (Signed)
Patient saw Dr. Rayann Heman 07/13/20.

## 2020-08-03 ENCOUNTER — Ambulatory Visit (INDEPENDENT_AMBULATORY_CARE_PROVIDER_SITE_OTHER): Payer: PPO

## 2020-08-03 DIAGNOSIS — I428 Other cardiomyopathies: Secondary | ICD-10-CM

## 2020-08-04 LAB — CUP PACEART REMOTE DEVICE CHECK
Battery Remaining Longevity: 3 mo
Battery Voltage: 2.82 V
Brady Statistic RA Percent Paced: 0 %
Brady Statistic RV Percent Paced: 99.99 %
Date Time Interrogation Session: 20211026000730
Implantable Lead Implant Date: 20061130
Implantable Lead Implant Date: 20061130
Implantable Lead Implant Date: 20061130
Implantable Lead Implant Date: 20061130
Implantable Lead Location: 753858
Implantable Lead Location: 753859
Implantable Lead Location: 753860
Implantable Lead Location: 753860
Implantable Lead Model: 4076
Implantable Lead Model: 4194
Implantable Lead Model: 5076
Implantable Lead Model: 6949
Implantable Pulse Generator Implant Date: 20140717
Lead Channel Impedance Value: 361 Ohm
Lead Channel Impedance Value: 399 Ohm
Lead Channel Impedance Value: 475 Ohm
Lead Channel Impedance Value: 494 Ohm
Lead Channel Impedance Value: 551 Ohm
Lead Channel Impedance Value: 589 Ohm
Lead Channel Impedance Value: 608 Ohm
Lead Channel Impedance Value: 684 Ohm
Lead Channel Impedance Value: 760 Ohm
Lead Channel Pacing Threshold Amplitude: 0.375 V
Lead Channel Pacing Threshold Pulse Width: 0.4 ms
Lead Channel Sensing Intrinsic Amplitude: 0.875 mV
Lead Channel Sensing Intrinsic Amplitude: 0.875 mV
Lead Channel Setting Pacing Amplitude: 1.25 V
Lead Channel Setting Pacing Amplitude: 2 V
Lead Channel Setting Pacing Pulse Width: 0.4 ms
Lead Channel Setting Pacing Pulse Width: 0.4 ms
Lead Channel Setting Sensing Sensitivity: 4 mV

## 2020-08-05 NOTE — Progress Notes (Signed)
Remote pacemaker transmission.   

## 2020-08-11 DIAGNOSIS — C4441 Basal cell carcinoma of skin of scalp and neck: Secondary | ICD-10-CM | POA: Diagnosis not present

## 2020-08-11 DIAGNOSIS — C44719 Basal cell carcinoma of skin of left lower limb, including hip: Secondary | ICD-10-CM | POA: Diagnosis not present

## 2020-08-11 DIAGNOSIS — L814 Other melanin hyperpigmentation: Secondary | ICD-10-CM | POA: Diagnosis not present

## 2020-08-11 DIAGNOSIS — C44619 Basal cell carcinoma of skin of left upper limb, including shoulder: Secondary | ICD-10-CM | POA: Diagnosis not present

## 2020-08-11 DIAGNOSIS — L570 Actinic keratosis: Secondary | ICD-10-CM | POA: Diagnosis not present

## 2020-08-11 DIAGNOSIS — L821 Other seborrheic keratosis: Secondary | ICD-10-CM | POA: Diagnosis not present

## 2020-08-11 DIAGNOSIS — Z85828 Personal history of other malignant neoplasm of skin: Secondary | ICD-10-CM | POA: Diagnosis not present

## 2020-08-11 DIAGNOSIS — C44519 Basal cell carcinoma of skin of other part of trunk: Secondary | ICD-10-CM | POA: Diagnosis not present

## 2020-08-11 DIAGNOSIS — C44319 Basal cell carcinoma of skin of other parts of face: Secondary | ICD-10-CM | POA: Diagnosis not present

## 2020-09-01 DIAGNOSIS — Z85828 Personal history of other malignant neoplasm of skin: Secondary | ICD-10-CM | POA: Diagnosis not present

## 2020-09-01 DIAGNOSIS — C44319 Basal cell carcinoma of skin of other parts of face: Secondary | ICD-10-CM | POA: Diagnosis not present

## 2020-09-07 ENCOUNTER — Ambulatory Visit (INDEPENDENT_AMBULATORY_CARE_PROVIDER_SITE_OTHER): Payer: PPO

## 2020-09-07 DIAGNOSIS — I428 Other cardiomyopathies: Secondary | ICD-10-CM | POA: Diagnosis not present

## 2020-09-09 LAB — CUP PACEART REMOTE DEVICE CHECK
Battery Remaining Longevity: 3 mo
Battery Voltage: 2.8 V
Brady Statistic RA Percent Paced: 0 %
Brady Statistic RV Percent Paced: 99.99 %
Date Time Interrogation Session: 20211201014712
Implantable Lead Implant Date: 20061130
Implantable Lead Implant Date: 20061130
Implantable Lead Implant Date: 20061130
Implantable Lead Implant Date: 20061130
Implantable Lead Location: 753858
Implantable Lead Location: 753859
Implantable Lead Location: 753860
Implantable Lead Location: 753860
Implantable Lead Model: 4076
Implantable Lead Model: 4194
Implantable Lead Model: 5076
Implantable Lead Model: 6949
Implantable Pulse Generator Implant Date: 20140717
Lead Channel Impedance Value: 361 Ohm
Lead Channel Impedance Value: 380 Ohm
Lead Channel Impedance Value: 456 Ohm
Lead Channel Impedance Value: 475 Ohm
Lead Channel Impedance Value: 551 Ohm
Lead Channel Impedance Value: 570 Ohm
Lead Channel Impedance Value: 589 Ohm
Lead Channel Impedance Value: 665 Ohm
Lead Channel Impedance Value: 722 Ohm
Lead Channel Pacing Threshold Amplitude: 0.375 V
Lead Channel Pacing Threshold Pulse Width: 0.4 ms
Lead Channel Sensing Intrinsic Amplitude: 0.875 mV
Lead Channel Sensing Intrinsic Amplitude: 0.875 mV
Lead Channel Setting Pacing Amplitude: 1.25 V
Lead Channel Setting Pacing Amplitude: 2 V
Lead Channel Setting Pacing Pulse Width: 0.4 ms
Lead Channel Setting Pacing Pulse Width: 0.4 ms
Lead Channel Setting Sensing Sensitivity: 4 mV

## 2020-09-11 NOTE — Progress Notes (Signed)
Remote pacemaker transmission.   

## 2020-09-23 ENCOUNTER — Telehealth: Payer: Self-pay

## 2020-09-23 ENCOUNTER — Encounter: Payer: Self-pay | Admitting: *Deleted

## 2020-09-23 NOTE — Telephone Encounter (Signed)
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 I told him I will send a note to Dr. Rayann Heman nurse Otila Kluver.

## 2020-09-23 NOTE — Telephone Encounter (Signed)
Left message that this is placed in the mail today.

## 2020-10-07 DIAGNOSIS — G4733 Obstructive sleep apnea (adult) (pediatric): Secondary | ICD-10-CM | POA: Diagnosis not present

## 2020-10-12 ENCOUNTER — Ambulatory Visit (INDEPENDENT_AMBULATORY_CARE_PROVIDER_SITE_OTHER): Payer: PPO

## 2020-10-12 DIAGNOSIS — I442 Atrioventricular block, complete: Secondary | ICD-10-CM

## 2020-10-13 LAB — CUP PACEART REMOTE DEVICE CHECK
Battery Remaining Longevity: 2 mo
Battery Voltage: 2.79 V
Brady Statistic RA Percent Paced: 0 %
Brady Statistic RV Percent Paced: 99.99 %
Date Time Interrogation Session: 20220103150528
Implantable Lead Implant Date: 20061130
Implantable Lead Implant Date: 20061130
Implantable Lead Implant Date: 20061130
Implantable Lead Implant Date: 20061130
Implantable Lead Location: 753858
Implantable Lead Location: 753859
Implantable Lead Location: 753860
Implantable Lead Location: 753860
Implantable Lead Model: 4076
Implantable Lead Model: 4194
Implantable Lead Model: 5076
Implantable Lead Model: 6949
Implantable Pulse Generator Implant Date: 20140717
Lead Channel Impedance Value: 361 Ohm
Lead Channel Impedance Value: 399 Ohm
Lead Channel Impedance Value: 475 Ohm
Lead Channel Impedance Value: 494 Ohm
Lead Channel Impedance Value: 551 Ohm
Lead Channel Impedance Value: 589 Ohm
Lead Channel Impedance Value: 608 Ohm
Lead Channel Impedance Value: 703 Ohm
Lead Channel Impedance Value: 760 Ohm
Lead Channel Pacing Threshold Amplitude: 0.375 V
Lead Channel Pacing Threshold Pulse Width: 0.4 ms
Lead Channel Sensing Intrinsic Amplitude: 1 mV
Lead Channel Sensing Intrinsic Amplitude: 1 mV
Lead Channel Setting Pacing Amplitude: 1.25 V
Lead Channel Setting Pacing Amplitude: 2 V
Lead Channel Setting Pacing Pulse Width: 0.4 ms
Lead Channel Setting Pacing Pulse Width: 0.4 ms
Lead Channel Setting Sensing Sensitivity: 4 mV

## 2020-10-21 ENCOUNTER — Ambulatory Visit: Payer: PPO | Admitting: Cardiology

## 2020-10-21 ENCOUNTER — Other Ambulatory Visit: Payer: Self-pay

## 2020-10-21 ENCOUNTER — Encounter: Payer: Self-pay | Admitting: Cardiology

## 2020-10-21 VITALS — BP 110/70 | HR 81 | Ht 77.0 in | Wt 251.0 lb

## 2020-10-21 DIAGNOSIS — I4821 Permanent atrial fibrillation: Secondary | ICD-10-CM | POA: Diagnosis not present

## 2020-10-21 DIAGNOSIS — I428 Other cardiomyopathies: Secondary | ICD-10-CM

## 2020-10-21 DIAGNOSIS — I442 Atrioventricular block, complete: Secondary | ICD-10-CM

## 2020-10-21 NOTE — Patient Instructions (Signed)
Medication Instructions:  The current medical regimen is effective;  continue present plan and medications.  *If you need a refill on your cardiac medications before your next appointment, please call your pharmacy*  Follow-Up: At [facility], you and your health needs are our priority.  As part of our continuing mission to provide you with exceptional heart care, we have created designated Provider Care Teams.  These Care Teams include your primary Cardiologist (physician) and Advanced Practice Providers (APPs -  Physician Assistants and Nurse Practitioners) who all work together to provide you with the care you need, when you need it.  We recommend signing up for the patient portal called "MyChart".  Sign up information is provided on this After Visit Summary.  MyChart is used to connect with patients for Virtual Visits (Telemedicine).  Patients are able to view lab/test results, encounter notes, upcoming appointments, etc.  Non-urgent messages can be sent to your provider as well.   To learn more about what you can do with MyChart, go to [email or web address].    Your next appointment:   12 month(s)  The format for your next appointment:   In Person  Provider:   Mark Skains, MD   Thank you for choosing [facility]!!      

## 2020-10-21 NOTE — Progress Notes (Signed)
Cardiology Office Note:    Date:  10/21/2020   ID:  [name], DOB Oct 24, 1945, MRN [id number]  PCP:  Marton Redwood, MD  [facility] Cardiologist:  Candee Furbish, MD  [facility] Electrophysiologist:  None   Referring MD: Marton Redwood, MD     History of Present Illness:    [name] is a 75 y.o. male here for the follow-up of nonischemic cardiomyopathy, no CAD on catheterization in 2004, chronic atrial fibrillation status post AV nodal ablation with biventricular pacemaker, hypertension hyperlipidemia obstructive sleep apnea on CPAP. Last EF 50%.  Had some evidence of moderately elevated pulmonary artery central venous pressures.   Overall been doing quite well.  No chest pain no fevers or chills.  Past Medical History:  Diagnosis Date  . AAA (abdominal aortic aneurysm) ([place])   . Arthritis   . Atrial fibrillation ([place])    permanent  . CAD (coronary artery disease)   . Chronic systolic dysfunction of left ventricle    s/p BiV ICD//downgrade to CRT-P (medtronic) on 04-25-2013 by Dr Rayann Heman  . Colon polyps   . Complete heart block (HCC)    s/p AV nodal ablation for afib  . Dysphagia   . Elevated LFTs   . Fatigue   . GERD (gastroesophageal reflux disease)   . Gout   . Hemochromatosis   . Hyperlipidemia   . IFG (impaired fasting glucose)   . Osteoarthritis   . Sleep apnea     Past Surgical History:  Procedure Laterality Date  . AV NODE ABLATION    . BIV PACEMAKER GENERATOR CHANGE OUT  04-25-2013   downgrade of previously implanted CRTD to Medtronic CRTP by Dr Rayann Heman.  Previously implanted LV and 4076 RV leads were used  . CARDIAC DEFIBRILLATOR PLACEMENT  2007, 2010   MDT BiV ICD,  [phone] RV lead, though pace/ sense is a Medtronic model 4076 - 58  lead; downgraded to CRTP on 04-25-2013 by Dr Rayann Heman  . HAND SURGERY     left  . HERNIA REPAIR    . HYDROCELE EXCISION    . IMPLANTABLE CARDIOVERTER DEFIBRILLATOR GENERATOR CHANGE N/A 04/25/2013   Procedure:  IMPLANTABLE CARDIOVERTER DEFIBRILLATOR GENERATOR CHANGE;  Surgeon: Thompson Grayer, MD;  Location: [facility] CATH LAB;  Service: Cardiovascular;  Laterality: N/A;  . PACEMAKER INSERTION  2006  . REPLACEMENT TOTAL KNEE  2000, 2011 x2   bilateral    Current Medications: Current Meds  Medication Sig  . carvedilol (COREG) 25 MG tablet Take 12.5 mg by mouth 2 (two) times daily.  . colchicine 0.6 MG tablet Take 0.6 mg by mouth as needed.  . furosemide (LASIX) 20 MG tablet Take 1 tablet (20 mg total) by mouth daily.  . Rivaroxaban (XARELTO) 20 MG TABS tablet Take 1 tablet (20 mg total) by mouth daily.  . simvastatin (ZOCOR) 40 MG tablet Take 40 mg by mouth every evening.  . traZODone (DESYREL) 50 MG tablet Take 50 mg by mouth at bedtime.   Current Facility-Administered Medications for the 10/21/20 encounter (Office Visit) with Jerline Pain, MD  Medication  . 0.9 %  sodium chloride infusion     Allergies:   Lisinopril and Niacin   Social History   Socioeconomic History  . Marital status: Married    Spouse name: Not on file  . Number of children: 2  . Years of education: Not on file  . Highest education level: Not on file  Occupational History  . Occupation: Press photographer  Tobacco Use  .  Smoking status: Former Smoker    Packs/day: 1.00    Years: 8.00    Pack years: 8.00    Types: Cigarettes    Quit date: 10/10/1997    Years since quitting: 23.0  . Smokeless tobacco: Never Used  Vaping Use  . Vaping Use: Never used  Substance and Sexual Activity  . Alcohol use: Yes    Comment: a 6 pack of beer a week, three glasses of wine a week  . Drug use: No  . Sexual activity: Yes  Other Topics Concern  . Not on file  Social History Narrative  . Not on file   Social Determinants of Health   Financial Resource Strain: Not on file  Food Insecurity: Not on file  Transportation Needs: Not on file  Physical Activity: Not on file  Stress: Not on file  Social Connections: Not on file     Family  History: The patient's family history includes Arthritis in his father; Bone cancer in his maternal uncle; Brain cancer in his brother; Breast cancer in his cousin; Emphysema in his father. There is no history of Colon cancer.  ROS:   Please see the history of present illness.     All other systems reviewed and are negative.  EKGs/Labs/Other Studies Reviewed:    Recent Labs: No results found for requested labs within last 8760 hours.  Recent Lipid Panel    Component Value Date/Time   CHOL 130 03/06/2019 1404   TRIG 76 03/06/2019 1404   HDL 61 03/06/2019 1404   CHOLHDL 2.1 03/06/2019 1404   CHOLHDL 2 01/08/2015 0843   VLDL 11.0 01/08/2015 0843   LDLCALC 54 03/06/2019 1404     Physical Exam:    VS:  BP 110/70 (BP Location: Left Arm, Patient Position: Sitting, Cuff Size: Normal)   Pulse 81   Ht 6\' 5"  (1.956 m)   Wt 251 lb (113.9 kg)   SpO2 95%   BMI 29.76 kg/m     Wt Readings from Last 3 Encounters:  10/21/20 251 lb (113.9 kg)  07/13/20 246 lb 9.6 oz (111.9 kg)  10/18/19 251 lb (113.9 kg)     GEN:  Well nourished, well developed in no acute distress HEENT: Normal NECK: No JVD; No carotid bruits LYMPHATICS: No lymphadenopathy CARDIAC: RRR, no murmurs, rubs, gallops RESPIRATORY:  Clear to auscultation without rales, wheezing or rhonchi  ABDOMEN: Soft, non-tender, non-distended MUSCULOSKELETAL:  No edema; No deformity  SKIN: Warm and dry NEUROLOGIC:  Alert and oriented x 3 PSYCHIATRIC:  Normal affect   ASSESSMENT:    No diagnosis found. PLAN:    In order of problems listed above:  Nonischemic cardiomyopathy - NYHA class II, chronic dyspnea for about 15 years.  Low-dose furosemide.  Low normal EF most recently.  No changes made.  Carvedilol  Permanent atrial fibrillation - AV nodal ablation pacemaker Xarelto.  Dr. Rayann Heman has been monitoring.  He is pacemaker dependent.  Has about 2 months left on his pacemaker.  Chronic anticoagulation - Continue with  Xarelto with no bleeding.  Hemoglobin and creatinine are being monitored.  At last check hemoglobin 14.4 creatinine 1.0 potassium 4.1 ALT 16 hemoglobin A1c 5.4  Hyperlipidemia - Continue with simvastatin no myalgias LDL 57        Medication Adjustments/Labs and Tests Ordered: Current medicines are reviewed at length with the patient today.  Concerns regarding medicines are outlined above.  No orders of the defined types were placed in this encounter.  No orders of  the defined types were placed in this encounter.   Patient Instructions  Medication Instructions:  The current medical regimen is effective;  continue present plan and medications.  *If you need a refill on your cardiac medications before your next appointment, please call your pharmacy*  Follow-Up: At [facility], you and your health needs are our priority.  As part of our continuing mission to provide you with exceptional heart care, we have created designated Provider Care Teams.  These Care Teams include your primary Cardiologist (physician) and Advanced Practice Providers (APPs -  Physician Assistants and Nurse Practitioners) who all work together to provide you with the care you need, when you need it.  We recommend signing up for the patient portal called "MyChart".  Sign up information is provided on this After Visit Summary.  MyChart is used to connect with patients for Virtual Visits (Telemedicine).  Patients are able to view lab/test results, encounter notes, upcoming appointments, etc.  Non-urgent messages can be sent to your provider as well.   To learn more about what you can do with MyChart, go to [email or web address].    Your next appointment:   12 month(s)  The format for your next appointment:   In Person  Provider:   Candee Furbish, MD   Thank you for choosing [facility]!!        Signed, Candee Furbish, MD  10/21/2020 3:36 PM    [place]

## 2020-10-23 NOTE — Addendum Note (Signed)
Addended by: Douglass Rivers D on: 10/23/2020 05:31 PM   Modules accepted: Level of Service

## 2020-10-23 NOTE — Progress Notes (Signed)
Remote pacemaker transmission.   

## 2020-10-28 DIAGNOSIS — R972 Elevated prostate specific antigen [PSA]: Secondary | ICD-10-CM | POA: Diagnosis not present

## 2020-11-04 DIAGNOSIS — R972 Elevated prostate specific antigen [PSA]: Secondary | ICD-10-CM | POA: Diagnosis not present

## 2020-11-16 ENCOUNTER — Ambulatory Visit (INDEPENDENT_AMBULATORY_CARE_PROVIDER_SITE_OTHER): Payer: PPO

## 2020-11-16 DIAGNOSIS — I428 Other cardiomyopathies: Secondary | ICD-10-CM

## 2020-11-17 ENCOUNTER — Telehealth: Payer: Self-pay

## 2020-11-17 LAB — CUP PACEART REMOTE DEVICE CHECK
Battery Remaining Longevity: 2 mo
Battery Voltage: 2.77 V
Brady Statistic RA Percent Paced: 0 %
Brady Statistic RV Percent Paced: 99.97 %
Date Time Interrogation Session: 20220207154221
Implantable Lead Implant Date: 20061130
Implantable Lead Implant Date: 20061130
Implantable Lead Implant Date: 20061130
Implantable Lead Implant Date: 20061130
Implantable Lead Location: 753858
Implantable Lead Location: 753859
Implantable Lead Location: 753860
Implantable Lead Location: 753860
Implantable Lead Model: 4076
Implantable Lead Model: 4194
Implantable Lead Model: 5076
Implantable Lead Model: 6949
Implantable Pulse Generator Implant Date: 20140717
Lead Channel Impedance Value: 361 Ohm
Lead Channel Impedance Value: 380 Ohm
Lead Channel Impedance Value: 456 Ohm
Lead Channel Impedance Value: 494 Ohm
Lead Channel Impedance Value: 551 Ohm
Lead Channel Impedance Value: 589 Ohm
Lead Channel Impedance Value: 608 Ohm
Lead Channel Impedance Value: 684 Ohm
Lead Channel Impedance Value: 760 Ohm
Lead Channel Pacing Threshold Amplitude: 0.375 V
Lead Channel Pacing Threshold Pulse Width: 0.4 ms
Lead Channel Sensing Intrinsic Amplitude: 0.75 mV
Lead Channel Sensing Intrinsic Amplitude: 0.75 mV
Lead Channel Setting Pacing Amplitude: 1.25 V
Lead Channel Setting Pacing Amplitude: 2 V
Lead Channel Setting Pacing Pulse Width: 0.4 ms
Lead Channel Setting Pacing Pulse Width: 0.4 ms
Lead Channel Setting Sensing Sensitivity: 4 mV

## 2020-11-17 NOTE — Telephone Encounter (Signed)
Spoke with pt, he reports he has always has some SOB and has not noticed an increase latly, he does have a slight cough but it has not bothered him.  He reports he doesn't always take his lasix as prescribed.  Stressed to patient compliance with lasix as directed at least for right now since his fluid level is up.  Also advised on watching sodium intake.

## 2020-11-17 NOTE — Telephone Encounter (Signed)
Optivol fluid index with thoracic impedance trending downward suggesting ongoing fluid retention (90 day zoom).   Pt meds include Furosemide 20mg  daily  Attempted to reach pt to assess, no answer.  DPR on file- LVM requesting callback with DC # and hours.

## 2020-11-19 NOTE — Progress Notes (Signed)
Remote pacemaker transmission.   

## 2020-12-16 ENCOUNTER — Telehealth: Payer: Self-pay | Admitting: Emergency Medicine

## 2020-12-16 DIAGNOSIS — I442 Atrioventricular block, complete: Secondary | ICD-10-CM

## 2020-12-16 NOTE — Telephone Encounter (Signed)
Advised patient his device has reached RRT as 11/26/20. Patient states he would like to get his device changed out asap so he can go to [place]. Advised patient I will forward to scheduling and Dr. Rayann Heman.

## 2020-12-16 NOTE — Telephone Encounter (Signed)
Called patient due to question in myChart concerning CRT-P battery, transmission from 12/15/20 shows device at RRT as of 11/26/20. Patient planning trip to [place] and inquiring about status . Dependent, VP @ 30.

## 2020-12-21 ENCOUNTER — Ambulatory Visit (INDEPENDENT_AMBULATORY_CARE_PROVIDER_SITE_OTHER): Payer: PPO

## 2020-12-21 DIAGNOSIS — I428 Other cardiomyopathies: Secondary | ICD-10-CM | POA: Diagnosis not present

## 2020-12-22 ENCOUNTER — Encounter: Payer: Self-pay | Admitting: *Deleted

## 2020-12-22 LAB — CUP PACEART REMOTE DEVICE CHECK
Battery Remaining Longevity: 2 mo
Battery Voltage: 2.75 V
Brady Statistic RA Percent Paced: 0 %
Brady Statistic RV Percent Paced: 99.97 %
Date Time Interrogation Session: 20220315002057
Implantable Lead Implant Date: 20061130
Implantable Lead Implant Date: 20061130
Implantable Lead Implant Date: 20061130
Implantable Lead Implant Date: 20061130
Implantable Lead Location: 753858
Implantable Lead Location: 753859
Implantable Lead Location: 753860
Implantable Lead Location: 753860
Implantable Lead Model: 4076
Implantable Lead Model: 4194
Implantable Lead Model: 5076
Implantable Lead Model: 6949
Implantable Pulse Generator Implant Date: 20140717
Lead Channel Impedance Value: 361 Ohm
Lead Channel Impedance Value: 380 Ohm
Lead Channel Impedance Value: 475 Ohm
Lead Channel Impedance Value: 494 Ohm
Lead Channel Impedance Value: 551 Ohm
Lead Channel Impedance Value: 589 Ohm
Lead Channel Impedance Value: 608 Ohm
Lead Channel Impedance Value: 703 Ohm
Lead Channel Impedance Value: 760 Ohm
Lead Channel Pacing Threshold Amplitude: 0.375 V
Lead Channel Pacing Threshold Pulse Width: 0.4 ms
Lead Channel Sensing Intrinsic Amplitude: 0.75 mV
Lead Channel Sensing Intrinsic Amplitude: 0.75 mV
Lead Channel Setting Pacing Amplitude: 1.25 V
Lead Channel Setting Pacing Amplitude: 2 V
Lead Channel Setting Pacing Pulse Width: 0.4 ms
Lead Channel Setting Pacing Pulse Width: 0.4 ms
Lead Channel Setting Sensing Sensitivity: 4 mV

## 2020-12-22 NOTE — Telephone Encounter (Signed)
Set pacemaker generator change, labs and covid screening. Will meet with patient on lab day for instructions and scrub.

## 2020-12-22 NOTE — Addendum Note (Signed)
Addended by: Darrell Jewel on: 12/22/2020 12:19 PM   Modules accepted: Orders

## 2020-12-23 ENCOUNTER — Other Ambulatory Visit: Payer: PPO | Admitting: *Deleted

## 2020-12-23 ENCOUNTER — Other Ambulatory Visit: Payer: Self-pay

## 2020-12-23 DIAGNOSIS — I442 Atrioventricular block, complete: Secondary | ICD-10-CM | POA: Diagnosis not present

## 2020-12-24 LAB — CBC WITH DIFFERENTIAL/PLATELET
Basophils Absolute: 0 10*3/uL (ref 0.0–0.2)
Basos: 1 %
EOS (ABSOLUTE): 0.2 10*3/uL (ref 0.0–0.4)
Eos: 3 %
Hematocrit: 41.7 % (ref 37.5–51.0)
Hemoglobin: 14.3 g/dL (ref 13.0–17.7)
Immature Grans (Abs): 0 10*3/uL (ref 0.0–0.1)
Immature Granulocytes: 0 %
Lymphocytes Absolute: 1 10*3/uL (ref 0.7–3.1)
Lymphs: 17 %
MCH: 34.3 pg — ABNORMAL HIGH (ref 26.6–33.0)
MCHC: 34.3 g/dL (ref 31.5–35.7)
MCV: 100 fL — ABNORMAL HIGH (ref 79–97)
Monocytes Absolute: 0.4 10*3/uL (ref 0.1–0.9)
Monocytes: 8 %
Neutrophils Absolute: 4.2 10*3/uL (ref 1.4–7.0)
Neutrophils: 71 %
Platelets: 224 10*3/uL (ref 150–450)
RBC: 4.17 x10E6/uL (ref 4.14–5.80)
RDW: 12.4 % (ref 11.6–15.4)
WBC: 5.8 10*3/uL (ref 3.4–10.8)

## 2020-12-24 LAB — BASIC METABOLIC PANEL
BUN/Creatinine Ratio: 16 (ref 10–24)
BUN: 17 mg/dL (ref 8–27)
CO2: 23 mmol/L (ref 20–29)
Calcium: 9.7 mg/dL (ref 8.6–10.2)
Chloride: 103 mmol/L (ref 96–106)
Creatinine, Ser: 1.04 mg/dL (ref 0.76–1.27)
Glucose: 83 mg/dL (ref 65–99)
Potassium: 4.3 mmol/L (ref 3.5–5.2)
Sodium: 143 mmol/L (ref 134–144)
eGFR: 76 mL/min/{1.73_m2} (ref >59)

## 2020-12-28 ENCOUNTER — Other Ambulatory Visit (HOSPITAL_COMMUNITY)
Admission: RE | Admit: 2020-12-28 | Discharge: 2020-12-28 | Disposition: A | Discharge status: Home / Self Care | Payer: PPO | Source: Ambulatory Visit | Attending: Internal Medicine | Admitting: Internal Medicine

## 2020-12-28 DIAGNOSIS — Z01812 Encounter for preprocedural laboratory examination: Secondary | ICD-10-CM | POA: Diagnosis not present

## 2020-12-28 DIAGNOSIS — Z20822 Contact with and (suspected) exposure to covid-19: Secondary | ICD-10-CM | POA: Diagnosis not present

## 2020-12-28 LAB — SARS CORONAVIRUS 2 (TAT 6-24 HRS): SARS Coronavirus 2: NEGATIVE

## 2020-12-28 NOTE — Pre-Procedure Instructions (Signed)
Instructed patient on the following items: Arrival time 1330 Nothing to eat or drink after midnight No meds AM of procedure Responsible person to drive you home and stay with you for 24 hrs Wash with special soap night before and morning of procedure If on anti-coagulant drug instructions Xarelto- last dose 3/20

## 2020-12-28 NOTE — Progress Notes (Signed)
Remote pacemaker transmission.   

## 2020-12-29 ENCOUNTER — Other Ambulatory Visit: Payer: Self-pay

## 2020-12-29 ENCOUNTER — Ambulatory Visit (HOSPITAL_COMMUNITY)
Admission: RE | Admit: 2020-12-29 | Discharge: 2020-12-29 | Disposition: A | Discharge status: Home / Self Care | Payer: PPO | Source: Ambulatory Visit | Attending: Internal Medicine | Admitting: Internal Medicine

## 2020-12-29 ENCOUNTER — Encounter (HOSPITAL_COMMUNITY)
Admission: RE | Disposition: A | Discharge status: Home / Self Care | Payer: Self-pay | Source: Ambulatory Visit | Attending: Internal Medicine

## 2020-12-29 DIAGNOSIS — I442 Atrioventricular block, complete: Secondary | ICD-10-CM | POA: Insufficient documentation

## 2020-12-29 DIAGNOSIS — Z7901 Long term (current) use of anticoagulants: Secondary | ICD-10-CM | POA: Insufficient documentation

## 2020-12-29 DIAGNOSIS — Z79899 Other long term (current) drug therapy: Secondary | ICD-10-CM | POA: Diagnosis not present

## 2020-12-29 DIAGNOSIS — I4821 Permanent atrial fibrillation: Secondary | ICD-10-CM

## 2020-12-29 DIAGNOSIS — I5022 Chronic systolic (congestive) heart failure: Secondary | ICD-10-CM

## 2020-12-29 DIAGNOSIS — Z45010 Encounter for checking and testing of cardiac pacemaker pulse generator [battery]: Secondary | ICD-10-CM

## 2020-12-29 HISTORY — PX: BIV PACEMAKER GENERATOR CHANGEOUT: EP1198

## 2020-12-29 SURGERY — BIV PACEMAKER GENERATOR CHANGEOUT

## 2020-12-29 MED ORDER — SODIUM CHLORIDE 0.9 % IV SOLN
250.0000 mL | INTRAVENOUS | Status: DC | PRN
Start: 2020-12-29 — End: 2020-12-29

## 2020-12-29 MED ORDER — LIDOCAINE HCL (PF) 1 % IJ SOLN
INTRAMUSCULAR | Status: DC | PRN
  Administered 2020-12-29: 50 mL

## 2020-12-29 MED ORDER — SODIUM CHLORIDE 0.9 % IV SOLN: INTRAVENOUS | Status: DC

## 2020-12-29 MED ORDER — ACETAMINOPHEN 325 MG PO TABS
325.0000 mg | ORAL_TABLET | ORAL | Status: DC | PRN
  Filled 2020-12-29: qty 2

## 2020-12-29 MED ORDER — LIDOCAINE HCL (PF) 1 % IJ SOLN
INTRAMUSCULAR | Status: AC
  Filled 2020-12-29: qty 60

## 2020-12-29 MED ORDER — MIDAZOLAM HCL 5 MG/5ML IJ SOLN
INTRAMUSCULAR | Status: DC | PRN
  Administered 2020-12-29: 2 mg via INTRAVENOUS
  Administered 2020-12-29: 1 mg via INTRAVENOUS

## 2020-12-29 MED ORDER — MIDAZOLAM HCL 5 MG/5ML IJ SOLN
INTRAMUSCULAR | Status: AC
  Filled 2020-12-29: qty 5

## 2020-12-29 MED ORDER — POVIDONE-IODINE 10 % EX SWAB
2.0000 "application " | Freq: Once | CUTANEOUS | Status: AC
  Administered 2020-12-29: 2 via TOPICAL

## 2020-12-29 MED ORDER — SODIUM CHLORIDE 0.9 % IV SOLN
INTRAVENOUS | Status: AC
  Filled 2020-12-29: qty 2

## 2020-12-29 MED ORDER — CEFAZOLIN SODIUM-DEXTROSE 2-4 GM/100ML-% IV SOLN
2.0000 g | INTRAVENOUS | Status: AC
  Administered 2020-12-29: 2 g via INTRAVENOUS

## 2020-12-29 MED ORDER — CEFAZOLIN SODIUM-DEXTROSE 2-4 GM/100ML-% IV SOLN
INTRAVENOUS | Status: AC
  Filled 2020-12-29: qty 100

## 2020-12-29 MED ORDER — SODIUM CHLORIDE 0.9 % IV SOLN
80.0000 mg | INTRAVENOUS | Status: AC
  Administered 2020-12-29: 80 mg

## 2020-12-29 MED ORDER — SODIUM CHLORIDE 0.9% FLUSH: 3.0000 mL | Freq: Two times a day (BID) | INTRAVENOUS | Status: DC

## 2020-12-29 MED ORDER — CHLORHEXIDINE GLUCONATE 4 % EX LIQD: 4.0000 "application " | Freq: Once | CUTANEOUS | Status: DC

## 2020-12-29 MED ORDER — SODIUM CHLORIDE 0.9% FLUSH: 3.0000 mL | INTRAVENOUS | Status: DC | PRN

## 2020-12-29 MED ORDER — ONDANSETRON HCL 4 MG/2ML IJ SOLN: 4.0000 mg | Freq: Four times a day (QID) | INTRAMUSCULAR | Status: DC | PRN

## 2020-12-29 SURGICAL SUPPLY — 7 items
CABLE SURGICAL S-101-97-12 (CABLE) ×2 IMPLANT
COVER DOME SNAP 22 D (MISCELLANEOUS) ×2 IMPLANT
PACEMAKER PRCT MRI CRTP W1TR01 (Pacemaker) ×1 IMPLANT
PAD PRO RADIOLUCENT 2001M-C (PAD) ×2 IMPLANT
POUCH AIGIS-R ANTIBACT ICD (Mesh General) ×2 IMPLANT
PPM PRECEPTA MRI CRT-P W1TR01 (Pacemaker) ×2 IMPLANT
TRAY PACEMAKER INSERTION (PACKS) ×2 IMPLANT

## 2020-12-29 NOTE — Discharge Instructions (Signed)
Pacemaker Battery Change, Care After This sheet gives you information about how to care for yourself after your procedure. Your health care provider may also give you more specific instructions. If you have problems or questions, contact your health care provider. What can I expect after the procedure? After the procedure, it is common to have these symptoms at the site where the pacemaker was inserted:  Mild pain or soreness.  Slight bruising.  Some swelling over the incisions.  A slight bump over the skin where the device was placed (if it was implanted in the upper chest area). Sometimes, it is possible to feel the device under the skin. This is normal. Follow these instructions at home: Incision care  Keep the incision clean and dry for 2-3 days after the procedure or as told by your health care provider. It takes several weeks for the incision site to completely heal.  Do not remove the bandage (dressing) on your chest until told to do so by your health care provider.  Leave stitches (sutures), skin glue, or adhesive strips in place. These skin closures may need to stay in place for 2 weeks or longer. If adhesive strip edges start to loosen and curl up, you may trim the loose edges. Do not remove adhesive strips completely unless your health care provider tells you to do that.  Do not take baths, swim, or use a hot tub for 7-10 days or until your health care provider approves. Ask your health care provider if you may take showers. You may only be allowed to take sponge baths.  Pat the incision area dry with a clean towel. Do not rub the area. This may cause bleeding.  Check your incision area every day for signs of infection. Check for: ? More redness, swelling, or pain. ? Fluid or blood. ? Warmth. ? Pus or a bad smell.  Avoid putting pressure on the area where the pacemaker was placed. Women may want to place a small pad over the incision site to protect it from their bra strap.    Medicines  Take over-the-counter and prescription medicines only as told by your health care provider.  If you were prescribed an antibiotic medicine, take it as told by your health care provider. Do not stop taking the antibiotic even if you start to feel better. Activity  For the first 2 weeks, or as long as told by your health care provider: ? Avoid lifting your left arm higher than your shoulder. ? Be gentle when you move your arms over your head. It is okay to raise your arm to comb your hair. ? Avoid exercise or activities that take a lot of effort.  Ask your health care provider when it is okay to: ? Return to your normal activities. ? Return to work or school. ? Resume sexual activity.  If you were given a medicine to help you relax (sedative) during the procedure, it can affect you for several hours. Do not drive or operate machinery until your health care provider says that it is safe. General instructions  Do not use any products that contain nicotine or tobacco, such as cigarettes, e-cigarettes, and chewing tobacco. These can delay incision healing after surgery. If you need help quitting, ask your health care provider.  Always let all health care providers, including dentists, know about your pacemaker before you have any medical procedures or tests.  You may be shown how to transfer data from your pacemaker through the phone to your  health care provider.  Wear a medical ID bracelet or necklace stating that you have a pacemaker, and carry a pacemaker ID card with you at all times.  Avoid close and prolonged exposure to electrical devices that have strong magnetic fields. These include: ? [employer]. When at the airport, let officials know that you have a pacemaker. Carry your pacemaker ID card. ? Metal detectors. If you must pass through a metal detector, walk through it quickly. Do not stop under the detector or stand near it.  When using your mobile  phone, hold it to the ear opposite the pacemaker. Do not leave your mobile phone in a pocket over the pacemaker.  Your pacemaker battery will last for 5-15 years. Your health care provider will do routine checks to know when the battery is starting to run down. When this happens, the pacemaker will need to be replaced.  Keep all follow-up visits as told by your health care provider. This is important. Contact a health care provider if:  You have pain at the incision site that is not relieved by medicines.  You have any of these signs of infection: ? More redness, swelling, or pain around your incision. ? Fluid or blood coming from your incision. ? Warmth coming from your incision. ? Pus or a bad smell coming from your incision. ? A fever.  You feel brief, occasional palpitations, light-headedness, or any symptoms that you think might be related to your heart. Get help right away if:  You have chest pain that is different from the pain at the pacemaker site.  You develop a red streak that extends above or below the incision site.  You have shortness of breath.  You have palpitations or an irregular heartbeat.  You have light-headedness that does not go away quickly.  You faint or have dizzy spells.  Your pulse suddenly drops or increases rapidly and does not return to normal.  You gain weight and your legs and ankles swell. Summary  After the procedure, it is common to have pain, soreness, and some swelling or bruising where the pacemaker was inserted.  Keep your incision clean and dry. Follow instructions from your health care provider about how to take care of your incision.  Check your incision every day for signs of infection, such as more pain or swelling, pus or a bad smell, warmth, or leaking fluid or blood.  Carry a pacemaker ID card with you at all times. This information is not intended to replace advice given to you by your health care provider. Make sure you  discuss any questions you have with your health care provider. Document Revised: 08/29/2019 Document Reviewed: 08/29/2019 Elsevier Patient Education  [place] 01/01/21 pm

## 2020-12-29 NOTE — Progress Notes (Signed)
Per Dr Rayann Heman client to remove pressure dressing in 48hours client and his wife notified and voiced understanding;

## 2020-12-29 NOTE — H&P (Signed)
PCP: Marton Redwood, MD Primary Cardiologist: Dr Marlou Porch Primary EP:  Dr Rayann Heman  [name] is a 75 y.o. male who presents today for pacemaker generator change.  Since last being seen in our clinic, the patient reports doing very well.  Today, he denies symptoms of palpitations, chest pain, shortness of breath,  lower extremity edema, dizziness, presyncope, or syncope.  The patient is otherwise without complaint today.       Past Medical History:  Diagnosis Date  . AAA (abdominal aortic aneurysm) ([place])   . Arthritis   . Atrial fibrillation ([place])    permanent  . CAD (coronary artery disease)   . Chronic systolic dysfunction of left ventricle    s/p BiV ICD//downgrade to CRT-P (medtronic) on 04-25-2013 by Dr Rayann Heman  . Colon polyps   . Complete heart block ([facility])    s/p AV nodal ablation for afib  . Dysphagia   . Elevated LFTs   . Fatigue   . GERD (gastroesophageal reflux disease)   . Gout   . Hemochromatosis   . Hyperlipidemia   . IFG (impaired fasting glucose)   . Osteoarthritis   . Sleep apnea         Past Surgical History:  Procedure Laterality Date  . AV NODE ABLATION    . BIV PACEMAKER GENERATOR CHANGE OUT  04-25-2013   downgrade of previously implanted CRTD to Medtronic CRTP by Dr Rayann Heman.  Previously implanted LV and 4076 RV leads were used  . CARDIAC DEFIBRILLATOR PLACEMENT  2007, 2010   MDT BiV ICD,  [phone] RV lead, though pace/ sense is a Medtronic model 4076 - 58  lead; downgraded to CRTP on 04-25-2013 by Dr Rayann Heman  . HAND SURGERY     left  . HERNIA REPAIR    . HYDROCELE EXCISION    . IMPLANTABLE CARDIOVERTER DEFIBRILLATOR GENERATOR CHANGE N/A 04/25/2013   Procedure: IMPLANTABLE CARDIOVERTER DEFIBRILLATOR GENERATOR CHANGE;  Surgeon: Thompson Grayer, MD;  [facility] C[facility];  Service: Cardiovascular;  Laterality: N/A;  . PACEMAKER INSERTION  2006  . REPLACEMENT TOTAL KNEE  2000, 2011 x2   bilateral    ROS- all systems are  reviewed and negative except as per HPI above  Current Outpatient Medications  Medication Sig Dispense Refill  . carvedilol (COREG) 25 MG tablet Take 12.5 mg by mouth 2 (two) times daily.    . colchicine 0.6 MG tablet Take 0.6 mg by mouth as needed.    . furosemide (LASIX) 20 MG tablet Take 1 tablet (20 mg total) by mouth daily. 90 tablet 3  . Rivaroxaban (XARELTO) 20 MG TABS tablet Take 1 tablet (20 mg total) by mouth daily. 30 tablet 3  . simvastatin (ZOCOR) 40 MG tablet Take 20 mg by mouth every evening.     . traZODone (DESYREL) 50 MG tablet Take 50 mg by mouth at bedtime.              Current Facility-Administered Medications  Medication Dose Route Frequency Provider Last Rate Last Admin  . 0.9 %  sodium chloride infusion  500 mL Intravenous Continuous Irene Shipper, MD        Physical Exam: Vitals:   12/29/20 1349  BP: (!) 156/96  Pulse: 81  Temp: 98.3 F (36.8 C)  SpO2: 98%    GEN- The patient is well appearing, alert and oriented x 3 today.   Head- normocephalic, atraumatic Eyes-  Sclera clear, conjunctiva pink Ears- hearing intact Oropharynx- clear Lungs- normal work of breathing  Chest- pacemaker pocket is well healed,  There is a 1cm x 1cm veracious black lesion over the cephalad portion of his device pocket which is chronic.  This does not appear to be pocket infection.  He has had this previously biopsied.  I have again cautioned against biopsies near his device pocket which could lead to infection. Heart- Regular rate and rhythm (paced) GI- soft, NT, ND, + BS Extremities- no clubbing, cyanosis, or edema   Echo 02/16/18- EF 50%, mild LVH, moderate to severe LA enlargement, severe RA enlargement  Assessment and Plan:  1. Symptomatic complete heart block His BiV pacemaker has reached ERI. Risks, benefits, and alternatives to Biv pacemaker pulse generator replacement were discussed in detail today.  The patient understands that risks include but are  not limited to bleeding, infection, pneumothorax, perforation, tamponade, vascular damage, renal failure, MI, stroke, death,  damage to his existing leads, and lead dislodgement and wishes to proceed.   Thompson Grayer MD, [place] 12/29/2020 2:34 PM

## 2020-12-30 ENCOUNTER — Encounter (HOSPITAL_COMMUNITY): Payer: Self-pay | Admitting: Internal Medicine

## 2020-12-30 DIAGNOSIS — E785 Hyperlipidemia, unspecified: Secondary | ICD-10-CM | POA: Diagnosis not present

## 2020-12-30 DIAGNOSIS — Z125 Encounter for screening for malignant neoplasm of prostate: Secondary | ICD-10-CM | POA: Diagnosis not present

## 2020-12-30 DIAGNOSIS — M109 Gout, unspecified: Secondary | ICD-10-CM | POA: Diagnosis not present

## 2020-12-30 DIAGNOSIS — R7301 Impaired fasting glucose: Secondary | ICD-10-CM | POA: Diagnosis not present

## 2020-12-30 NOTE — Telephone Encounter (Signed)
Answered all questions. Patient verbalized understanding.

## 2021-01-06 DIAGNOSIS — I2510 Atherosclerotic heart disease of native coronary artery without angina pectoris: Secondary | ICD-10-CM | POA: Diagnosis not present

## 2021-01-06 DIAGNOSIS — E785 Hyperlipidemia, unspecified: Secondary | ICD-10-CM | POA: Diagnosis not present

## 2021-01-06 DIAGNOSIS — I5022 Chronic systolic (congestive) heart failure: Secondary | ICD-10-CM | POA: Diagnosis not present

## 2021-01-06 DIAGNOSIS — N281 Cyst of kidney, acquired: Secondary | ICD-10-CM | POA: Diagnosis not present

## 2021-01-06 DIAGNOSIS — R82998 Other abnormal findings in urine: Secondary | ICD-10-CM | POA: Diagnosis not present

## 2021-01-06 DIAGNOSIS — R7301 Impaired fasting glucose: Secondary | ICD-10-CM | POA: Diagnosis not present

## 2021-01-06 DIAGNOSIS — R1310 Dysphagia, unspecified: Secondary | ICD-10-CM | POA: Diagnosis not present

## 2021-01-06 DIAGNOSIS — M109 Gout, unspecified: Secondary | ICD-10-CM | POA: Diagnosis not present

## 2021-01-06 DIAGNOSIS — Z0000 Encounter for general adult medical examination without abnormal findings: Secondary | ICD-10-CM | POA: Diagnosis not present

## 2021-01-06 DIAGNOSIS — I480 Paroxysmal atrial fibrillation: Secondary | ICD-10-CM | POA: Diagnosis not present

## 2021-01-06 DIAGNOSIS — Z1339 Encounter for screening examination for other mental health and behavioral disorders: Secondary | ICD-10-CM | POA: Diagnosis not present

## 2021-01-06 DIAGNOSIS — G629 Polyneuropathy, unspecified: Secondary | ICD-10-CM | POA: Diagnosis not present

## 2021-01-06 DIAGNOSIS — D6869 Other thrombophilia: Secondary | ICD-10-CM | POA: Diagnosis not present

## 2021-01-06 DIAGNOSIS — Z1331 Encounter for screening for depression: Secondary | ICD-10-CM | POA: Diagnosis not present

## 2021-01-06 DIAGNOSIS — R972 Elevated prostate specific antigen [PSA]: Secondary | ICD-10-CM | POA: Diagnosis not present

## 2021-01-07 DIAGNOSIS — Z1212 Encounter for screening for malignant neoplasm of rectum: Secondary | ICD-10-CM | POA: Diagnosis not present

## 2021-01-12 ENCOUNTER — Ambulatory Visit: Payer: PPO

## 2021-01-12 ENCOUNTER — Other Ambulatory Visit: Payer: Self-pay

## 2021-01-12 ENCOUNTER — Ambulatory Visit (INDEPENDENT_AMBULATORY_CARE_PROVIDER_SITE_OTHER): Payer: PPO | Admitting: Emergency Medicine

## 2021-01-12 ENCOUNTER — Telehealth: Payer: Self-pay

## 2021-01-12 DIAGNOSIS — I428 Other cardiomyopathies: Secondary | ICD-10-CM | POA: Diagnosis not present

## 2021-01-12 LAB — CUP PACEART INCLINIC DEVICE CHECK
Battery Remaining Longevity: 149 mo
Battery Voltage: 3.22 V
Brady Statistic AP VP Percent: 0 %
Brady Statistic AP VS Percent: 0 %
Brady Statistic AS VP Percent: 99.99 %
Brady Statistic AS VS Percent: 0.01 %
Brady Statistic RA Percent Paced: 0 %
Brady Statistic RV Percent Paced: 99.99 %
Date Time Interrogation Session: 20220405162055
Implantable Lead Implant Date: 20061130
Implantable Lead Implant Date: 20061130
Implantable Lead Implant Date: 20061130
Implantable Lead Implant Date: 20061130
Implantable Lead Location: 753858
Implantable Lead Location: 753859
Implantable Lead Location: 753860
Implantable Lead Location: 753860
Implantable Lead Model: 4076
Implantable Lead Model: 4194
Implantable Lead Model: 5076
Implantable Lead Model: 6949
Implantable Pulse Generator Implant Date: 20220322
Lead Channel Impedance Value: 342 Ohm
Lead Channel Impedance Value: 361 Ohm
Lead Channel Impedance Value: 475 Ohm
Lead Channel Impedance Value: 513 Ohm
Lead Channel Impedance Value: 513 Ohm
Lead Channel Impedance Value: 532 Ohm
Lead Channel Impedance Value: 589 Ohm
Lead Channel Impedance Value: 646 Ohm
Lead Channel Impedance Value: 722 Ohm
Lead Channel Pacing Threshold Amplitude: 0.375 V
Lead Channel Pacing Threshold Amplitude: 0.75 V
Lead Channel Pacing Threshold Pulse Width: 0.4 ms
Lead Channel Pacing Threshold Pulse Width: 0.4 ms
Lead Channel Setting Pacing Amplitude: 1.25 V
Lead Channel Setting Pacing Amplitude: 2 V
Lead Channel Setting Pacing Pulse Width: 0.4 ms
Lead Channel Setting Pacing Pulse Width: 0.4 ms
Lead Channel Setting Sensing Sensitivity: 5.6 mV

## 2021-01-12 NOTE — Patient Instructions (Signed)
Please send a mychart message when you get home with the following information below that is located on the back of your home remote monitor.  Serial # Model #  Device Clinic [phone]

## 2021-01-12 NOTE — Progress Notes (Signed)
Wound check appointment. Steri-strips removed. Wound without redness or edema. Incision edges approximated, wound well healed.   CRT-P device check in clinic. Normal device function. Thresholds, sensing, impedance consistent with previous measurements. Histograms appropriate for patient and level of activity. No ventricular high rate episodes. Patient bi-ventricularly pacing 100% of the time. Device programmed with appropriate safety margins. Device is programmed VVIR. RV & LV outputs are at chronic Voltage d/t chronic leads in place. Patient enrolled in remote follow-up 03/01/21. Plan to check device remotely in 3 months and every 6 months in office.

## 2021-01-12 NOTE — Telephone Encounter (Signed)
Patient seen in device clinic today and monitor noted it was not connected in [place]. Patient states when he gets home he will send a mychart message including serial # and model # of his monitor.

## 2021-01-13 ENCOUNTER — Telehealth: Payer: Self-pay

## 2021-01-13 NOTE — Telephone Encounter (Signed)
I let the patient know that his monitor is not compatible with his new pacemaker. I gave him the option of using the app, relay or the [id number] monitor. The patient chose the Relay monitor. I let him know that the monitor is on a 15 week back order but I do have one at the office he can have. He states he will be by the office on Friday to pick up the monitor.

## 2021-01-19 NOTE — Telephone Encounter (Signed)
Pt transmitted on 01/17/2021.

## 2021-02-25 DIAGNOSIS — M25561 Pain in right knee: Secondary | ICD-10-CM | POA: Diagnosis not present

## 2021-02-25 DIAGNOSIS — Z96659 Presence of unspecified artificial knee joint: Secondary | ICD-10-CM | POA: Diagnosis not present

## 2021-03-30 ENCOUNTER — Ambulatory Visit (INDEPENDENT_AMBULATORY_CARE_PROVIDER_SITE_OTHER): Payer: PPO

## 2021-03-30 DIAGNOSIS — I428 Other cardiomyopathies: Secondary | ICD-10-CM

## 2021-03-30 LAB — CUP PACEART REMOTE DEVICE CHECK
Battery Remaining Longevity: 145 mo
Battery Voltage: 3.19 V
Brady Statistic AP VP Percent: 0 %
Brady Statistic AP VS Percent: 0 %
Brady Statistic AS VP Percent: 99.98 %
Brady Statistic AS VS Percent: 0.02 %
Brady Statistic RA Percent Paced: 0 %
Brady Statistic RV Percent Paced: 99.98 %
Date Time Interrogation Session: 20220621050652
Implantable Lead Implant Date: 20061130
Implantable Lead Implant Date: 20061130
Implantable Lead Implant Date: 20061130
Implantable Lead Implant Date: 20061130
Implantable Lead Location: 753858
Implantable Lead Location: 753859
Implantable Lead Location: 753860
Implantable Lead Location: 753860
Implantable Lead Model: 4076
Implantable Lead Model: 4194
Implantable Lead Model: 5076
Implantable Lead Model: 6949
Implantable Pulse Generator Implant Date: 20220322
Lead Channel Impedance Value: 323 Ohm
Lead Channel Impedance Value: 361 Ohm
Lead Channel Impedance Value: 418 Ohm
Lead Channel Impedance Value: 494 Ohm
Lead Channel Impedance Value: 532 Ohm
Lead Channel Impedance Value: 532 Ohm
Lead Channel Impedance Value: 570 Ohm
Lead Channel Impedance Value: 646 Ohm
Lead Channel Impedance Value: 722 Ohm
Lead Channel Pacing Threshold Amplitude: 0.375 V
Lead Channel Pacing Threshold Pulse Width: 0.4 ms
Lead Channel Sensing Intrinsic Amplitude: 0.75 mV
Lead Channel Setting Pacing Amplitude: 1.25 V
Lead Channel Setting Pacing Amplitude: 2 V
Lead Channel Setting Pacing Pulse Width: 0.4 ms
Lead Channel Setting Pacing Pulse Width: 0.4 ms
Lead Channel Setting Sensing Sensitivity: 5.6 mV

## 2021-04-07 ENCOUNTER — Encounter: Payer: Self-pay | Admitting: Internal Medicine

## 2021-04-07 ENCOUNTER — Ambulatory Visit: Payer: PPO | Admitting: Internal Medicine

## 2021-04-07 ENCOUNTER — Other Ambulatory Visit: Payer: Self-pay

## 2021-04-07 ENCOUNTER — Encounter: Payer: Self-pay | Admitting: *Deleted

## 2021-04-07 VITALS — BP 108/72 | HR 77 | Ht 77.0 in | Wt 246.4 lb

## 2021-04-07 DIAGNOSIS — I5022 Chronic systolic (congestive) heart failure: Secondary | ICD-10-CM | POA: Diagnosis not present

## 2021-04-07 DIAGNOSIS — G4733 Obstructive sleep apnea (adult) (pediatric): Secondary | ICD-10-CM | POA: Diagnosis not present

## 2021-04-07 DIAGNOSIS — Z950 Presence of cardiac pacemaker: Secondary | ICD-10-CM

## 2021-04-07 DIAGNOSIS — I4821 Permanent atrial fibrillation: Secondary | ICD-10-CM

## 2021-04-07 DIAGNOSIS — R079 Chest pain, unspecified: Secondary | ICD-10-CM

## 2021-04-07 DIAGNOSIS — I442 Atrioventricular block, complete: Secondary | ICD-10-CM

## 2021-04-07 NOTE — Patient Instructions (Addendum)
Medication Instructions:  Your physician recommends that you continue on your current medications as directed. Please refer to the Current Medication list given to you today.  Labwork: None ordered.  Testing/Procedures: Your physician has requested that you have an echocardiogram. Echocardiography is a painless test that uses sound waves to create images of your heart. It provides your doctor with information about the size and shape of your heart and how well your heart's chambers and valves are working. This procedure takes approximately one hour. There are no restrictions for this procedure.   Your physician has requested that you have a lexiscan myoview. For further information please visit [email or web address]. Please follow instruction sheet, as given.   Follow-Up: Your physician wants you to follow-up in: 06/07/21 at 9:20 am with  Legrand Como "Jonni Sanger" Chalmers Cater, PA-C    Remote monitoring is used to monitor your Pacemaker from home. This monitoring reduces the number of office visits required to check your device to one time per year. It allows [place] to keep an eye on the functioning of your device to ensure it is working properly. You are scheduled for a device check from home on 06/29/21. You may send your transmission at any time that day. If you have a wireless device, the transmission will be sent automatically. After your physician reviews your transmission, you will receive a postcard with your next transmission date.  Any Other Special Instructions Will Be Listed Below (If Applicable).  If you need a refill on your cardiac medications before your next appointment, please call your pharmacy.

## 2021-04-07 NOTE — Progress Notes (Signed)
PCP: Ginger Organ., MD Primary Cardiologist: Dr Marlou Porch Primary EP:  Dr Rayann Heman  [name] is a 75 y.o. male who presents today for routine electrophysiology followup.  Since last being seen in our clinic, the patient reports doing reasonably well.  + fatigue.  He is very tired in the afternoons.  + edema,  + SOB and occasional exertional chest pain. Today, he denies symptoms of palpitations,   dizziness, presyncope, or syncope.  The patient is otherwise without complaint today.   Past Medical History:  Diagnosis Date   AAA (abdominal aortic aneurysm) ([facility])    Arthritis    Atrial fibrillation ([facility])    permanent   CAD (coronary artery disease)    Chronic systolic dysfunction of left ventricle    s/p BiV ICD//downgrade to CRT-P (medtronic) on 04-25-2013 by Dr Rayann Heman   Colon polyps    Complete heart block ([facility])    s/p AV nodal ablation for afib   Dysphagia    Elevated LFTs    Fatigue    GERD (gastroesophageal reflux disease)    Gout    Hemochromatosis    Hyperlipidemia    IFG (impaired fasting glucose)    Osteoarthritis    Sleep apnea    Past Surgical History:  Procedure Laterality Date   AV NODE ABLATION     BIV PACEMAKER GENERATOR CHANGE OUT  04-25-2013   downgrade of previously implanted CRTD to Medtronic CRTP by Dr Rayann Heman.  Previously implanted LV and 4076 RV leads were used   BIV PACEMAKER GENERATOR CHANGEOUT N/A 12/29/2020   Procedure: BIV PACEMAKER GENERATOR CHANGEOUT;  Surgeon: Thompson Grayer, MD;  Location: [facility];  Service: Cardiovascular;  Laterality: N/A;   CARDIAC DEFIBRILLATOR PLACEMENT  2007, 2010   MDT BiV ICD,  [phone] RV lead, though pace/ sense is a Medtronic model 4076 - 58  lead; downgraded to [place] on 04-25-2013 by Dr Rayann Heman   HAND SURGERY     left   HERNIA REPAIR     HYDROCELE EXCISION     IMPLANTABLE CARDIOVERTER DEFIBRILLATOR GENERATOR CHANGE N/A 04/25/2013   Procedure: IMPLANTABLE CARDIOVERTER DEFIBRILLATOR GENERATOR CHANGE;  Surgeon:  Thompson Grayer, MD;  Location: [facility] CATH LAB;  Service: Cardiovascular;  Laterality: N/A;   PACEMAKER INSERTION  2006   REPLACEMENT TOTAL KNEE  2000, 2011 x2   bilateral    ROS- all systems are reviewed and negative except as per HPI above  Current Outpatient Medications  Medication Sig Dispense Refill   carvedilol (COREG) 25 MG tablet Take 12.5 mg by mouth 2 (two) times daily.     cholecalciferol (VITAMIN D3) 25 MCG (1000 UNIT) tablet Take 1,000 Units by mouth daily.     clonazePAM (KLONOPIN) 0.5 MG tablet Take 0.5 mg by mouth at bedtime as needed (sleep).     furosemide (LASIX) 20 MG tablet Take 1 tablet (20 mg total) by mouth daily. 90 tablet 3   Rivaroxaban (XARELTO) 20 MG TABS tablet Take 1 tablet (20 mg total) by mouth daily. 30 tablet 3   simvastatin (ZOCOR) 80 MG tablet Take 40 mg by mouth in the morning and at bedtime.     traZODone (DESYREL) 50 MG tablet Take 50 mg by mouth at bedtime as needed for sleep.     zinc gluconate 50 MG tablet Take 50 mg by mouth daily.     Current Facility-Administered Medications  Medication Dose Route Frequency Provider Last Rate Last Admin   0.9 %  sodium chloride  infusion  500 mL Intravenous Continuous Irene Shipper, MD        Physical Exam: Vitals:   04/07/21 1431  BP: 108/72  Pulse: 77  SpO2: 95%  Weight: 246 lb 6.4 oz (111.8 kg)  Height: 6\' 5"  (1.956 m)    GEN- The patient is well appearing, alert and oriented x 3 today.   Head- normocephalic, atraumatic Eyes-  Sclera clear, conjunctiva pink Ears- hearing intact Oropharynx- clear Lungs- Clear to ausculation bilaterally, normal work of breathing Chest- pacemaker pocket is well healed Heart- Regular rate and rhythm, no murmurs, rubs or gallops, PMI not laterally displaced GI- soft, NT, ND, + BS Extremities- no clubbing, cyanosis, or edema  Pacemaker interrogation- reviewed in detail today,  See PACEART report  ekg tracing ordered today is personally reviewed and shows afib, V  pacing  Assessment and Plan:  1. Symptomatic complete heart block S/p prior AV nodal ablation Normal biventricular pacemaker function See Pace Art report No changes today he is device dependant today  2. Permanent atrial fibrillation Rate controlled Continue xarelto  3. OSA Compliance with CPAP advised  4. Chronic systolic dysfunction EF has normalized with CRT previously,   now with fatigue and reduced exercise tolerance. Repeat echo Return to see EP APP for VV optimization by ekg in 2 months pending echo findings.  May also benefit from additional CHF medicine if EF has reduced.  5. HL Continue simvastatin, no myalagias  6. SOB/ chest pain Myoview to evaluate for ischemia Echo as above  Risks, benefits and potential toxicities for medications prescribed and/or refilled reviewed with patient today.   Return to see EP APP in 2 months  Thompson Grayer MD, [facility] 04/07/2021 2:51 PM

## 2021-04-15 ENCOUNTER — Encounter: Payer: Self-pay | Admitting: Internal Medicine

## 2021-04-19 NOTE — Progress Notes (Signed)
Remote pacemaker transmission.   

## 2021-04-24 ENCOUNTER — Encounter (HOSPITAL_BASED_OUTPATIENT_CLINIC_OR_DEPARTMENT_OTHER): Payer: Self-pay | Admitting: Emergency Medicine

## 2021-04-24 ENCOUNTER — Emergency Department (HOSPITAL_BASED_OUTPATIENT_CLINIC_OR_DEPARTMENT_OTHER): Payer: PPO

## 2021-04-24 ENCOUNTER — Other Ambulatory Visit: Payer: Self-pay

## 2021-04-24 ENCOUNTER — Emergency Department (HOSPITAL_BASED_OUTPATIENT_CLINIC_OR_DEPARTMENT_OTHER)
Admission: EM | Admit: 2021-04-24 | Discharge: 2021-04-24 | Disposition: A | Discharge status: Home / Self Care | Payer: PPO | Attending: Emergency Medicine | Admitting: Emergency Medicine

## 2021-04-24 DIAGNOSIS — Z79899 Other long term (current) drug therapy: Secondary | ICD-10-CM | POA: Diagnosis not present

## 2021-04-24 DIAGNOSIS — Z96653 Presence of artificial knee joint, bilateral: Secondary | ICD-10-CM | POA: Insufficient documentation

## 2021-04-24 DIAGNOSIS — Z950 Presence of cardiac pacemaker: Secondary | ICD-10-CM | POA: Diagnosis not present

## 2021-04-24 DIAGNOSIS — I5042 Chronic combined systolic (congestive) and diastolic (congestive) heart failure: Secondary | ICD-10-CM | POA: Diagnosis not present

## 2021-04-24 DIAGNOSIS — Z7901 Long term (current) use of anticoagulants: Secondary | ICD-10-CM | POA: Diagnosis not present

## 2021-04-24 DIAGNOSIS — I110 Hypertensive heart disease with heart failure: Secondary | ICD-10-CM | POA: Insufficient documentation

## 2021-04-24 DIAGNOSIS — I4891 Unspecified atrial fibrillation: Secondary | ICD-10-CM | POA: Insufficient documentation

## 2021-04-24 DIAGNOSIS — R0789 Other chest pain: Secondary | ICD-10-CM | POA: Insufficient documentation

## 2021-04-24 DIAGNOSIS — R079 Chest pain, unspecified: Secondary | ICD-10-CM | POA: Diagnosis not present

## 2021-04-24 DIAGNOSIS — I2510 Atherosclerotic heart disease of native coronary artery without angina pectoris: Secondary | ICD-10-CM | POA: Insufficient documentation

## 2021-04-24 DIAGNOSIS — Z87891 Personal history of nicotine dependence: Secondary | ICD-10-CM | POA: Diagnosis not present

## 2021-04-24 LAB — BASIC METABOLIC PANEL WITH GFR
Anion gap: 6 (ref 5–15)
BUN: 18 mg/dL (ref 8–23)
CO2: 28 mmol/L (ref 22–32)
Calcium: 9 mg/dL (ref 8.9–10.3)
Chloride: 106 mmol/L (ref 98–111)
Creatinine, Ser: 1.08 mg/dL (ref 0.61–1.24)
GFR, Estimated: >60 mL/min
Glucose, Bld: 84 mg/dL (ref 70–99)
Potassium: 3.6 mmol/L (ref 3.5–5.1)
Sodium: 140 mmol/L (ref 135–145)

## 2021-04-24 LAB — CBC WITH DIFFERENTIAL/PLATELET
Abs Immature Granulocytes: 0 10*3/uL (ref 0.00–0.07)
Basophils Absolute: 0 10*3/uL (ref 0.0–0.1)
Basophils Relative: 1 %
Eosinophils Absolute: 0.1 10*3/uL (ref 0.0–0.5)
Eosinophils Relative: 2 %
HCT: 39.5 % (ref 39.0–52.0)
Hemoglobin: 13.7 g/dL (ref 13.0–17.0)
Immature Granulocytes: 0 %
Lymphocytes Relative: 12 %
Lymphs Abs: 0.8 10*3/uL (ref 0.7–4.0)
MCH: 34.7 pg — ABNORMAL HIGH (ref 26.0–34.0)
MCHC: 34.7 g/dL (ref 30.0–36.0)
MCV: 100 fL (ref 80.0–100.0)
Monocytes Absolute: 0.5 10*3/uL (ref 0.1–1.0)
Monocytes Relative: 7 %
Neutro Abs: 5 10*3/uL (ref 1.7–7.7)
Neutrophils Relative %: 78 %
Platelets: 188 10*3/uL (ref 150–400)
RBC: 3.95 MIL/uL — ABNORMAL LOW (ref 4.22–5.81)
RDW: 12.9 % (ref 11.5–15.5)
WBC: 6.4 10*3/uL (ref 4.0–10.5)
nRBC: 0 % (ref 0.0–0.2)

## 2021-04-24 LAB — TROPONIN I (HIGH SENSITIVITY)
Troponin I (High Sensitivity): 6 ng/L (ref <18)
Troponin I (High Sensitivity): 6 ng/L (ref <18)

## 2021-04-24 LAB — BRAIN NATRIURETIC PEPTIDE: B Natriuretic Peptide: 139.3 pg/mL — ABNORMAL HIGH (ref 0.0–100.0)

## 2021-04-24 MED ORDER — METHOCARBAMOL 500 MG PO TABS: 500.0000 mg | ORAL_TABLET | Freq: Two times a day (BID) | ORAL | 0 refills | Status: DC

## 2021-04-24 MED ORDER — DICLOFENAC SODIUM 1 % EX GEL: 4.0000 g | Freq: Four times a day (QID) | CUTANEOUS | 0 refills | Status: DC

## 2021-04-24 MED ORDER — ASPIRIN 81 MG PO CHEW
324.0000 mg | CHEWABLE_TABLET | Freq: Once | ORAL | Status: AC
  Administered 2021-04-24: 324 mg via ORAL
  Filled 2021-04-24: qty 4

## 2021-04-24 MED ORDER — ACETAMINOPHEN 325 MG PO TABS
650.0000 mg | ORAL_TABLET | Freq: Once | ORAL | Status: AC
  Administered 2021-04-24: 650 mg via ORAL
  Filled 2021-04-24: qty 2

## 2021-04-24 NOTE — Discharge Instructions (Addendum)
Please follow-up with your cardiologist in the next week.  Please follow-up with your primary care provider as well.  Your work-up today was quite reassuring.  Your cardiac enzymes are within normal limits.  I am reassured that your chest pain improved with some Tylenol and aspirin.  Your chest x-ray was without any evidence of pneumonia or other acute disease.  Ultimately I would treat this as a muscular chest pain for now.  Obviously come back to the ER for any new or concerning symptoms however using Tylenol every 6 hours, Voltaren gel and warm compresses as well as stretching will likely improve your pain.  Tylenol 1000mg  every 6 hours for pain  Voltaren gel -- you may apply directly to the left chest.

## 2021-04-24 NOTE — ED Triage Notes (Signed)
Pt reports LT chest/rib pain x 1 week, sts much worse today and increases with movement; no injury

## 2021-04-24 NOTE — ED Provider Notes (Signed)
[facility] EMERGENCY DEPARTMENT Provider Note   CSN: [id number] Arrival date & time: 04/24/21  1416     History Chief Complaint  Patient presents with   Chest Pain    [name] is a 75 y.o. male.  HPI Patient is a 75 year old male with past medical history significant for AAA, A. fib, CAD, congestive heart failure, pacemaker status post complete heart block after AV nodal ablation for A. fib, history of fatigue, HLD, sleep apnea  Patient presents the ER today with left-sided chest pain he states that has been present intermittently for the past week but has become much more frequent and more painful and constant today.  He states that it generally only occurs when he moves around he denies any nausea vomiting or diaphoresis.  Denies any shortness of breath or lightheadedness or dizziness.  Denies any weakness or numbness in either leg or upper extremity.  States the pain is neither pleuritic nor exertional.  Denies any hemoptysis is still on Xarelto for his A. fib.  States he takes it as directed has had no missed doses recently.  No recent surgeries, hospitalization, long travel, hemoptysis, estrogen containing OCP, cancer history.  No unilateral leg swelling.  No history of PE or VTE.       Past Medical History:  Diagnosis Date   AAA (abdominal aortic aneurysm) (HCC)    Arthritis    Atrial fibrillation ([place])    permanent   CAD (coronary artery disease)    Chronic systolic dysfunction of left ventricle    s/p BiV ICD//downgrade to CRT-P (medtronic) on 04-25-2013 by Dr Rayann Heman   Colon polyps    Complete heart block ([place])    s/p AV nodal ablation for afib   Dysphagia    Elevated LFTs    Fatigue    GERD (gastroesophageal reflux disease)    Gout    Hemochromatosis    Hyperlipidemia    IFG (impaired fasting glucose)    Osteoarthritis    Sleep apnea     Patient Active Problem List   Diagnosis Date Noted   ICD (implantable cardioverter-defibrillator)  battery depletion 04/17/2013   Insomnia 03/14/2013   Dyspnea 03/14/2013   Long term (current) use of anticoagulants 09/14/2011   Chronic diastolic heart failure ([place]) 09/05/2011   NICM (nonischemic cardiomyopathy) ([place]) 09/05/2011   OSA (obstructive sleep apnea) 05/13/2011   AV BLOCK, COMPLETE 08/14/2009   Atrial fibrillation ([place]) 08/14/2009   Other primary cardiomyopathies 04/30/2009   OTHER DYSPHAGIA 01/13/2009   HYPERCHOLESTEROLEMIA 01/08/2009   HYPERTENSION 01/08/2009   ABNORMAL HEART RHYTHMS 01/08/2009   GERD 01/08/2009   COLONIC POLYPS, ADENOMATOUS, HX OF 01/08/2009    Past Surgical History:  Procedure Laterality Date   AV NODE ABLATION     BIV PACEMAKER GENERATOR CHANGE OUT  04-25-2013   downgrade of previously implanted CRTD to Medtronic CRTP by Dr Rayann Heman.  Previously implanted LV and 4076 RV leads were used   BIV PACEMAKER GENERATOR CHANGEOUT N/A 12/29/2020   Procedure: BIV PACEMAKER GENERATOR CHANGEOUT;  Surgeon: Thompson Grayer, MD;  Location: [facility];  Service: Cardiovascular;  Laterality: N/A;   CARDIAC DEFIBRILLATOR PLACEMENT  2007, 2010   MDT BiV ICD,  [phone] RV lead, though pace/ sense is a Medtronic model 4076 - 58  lead; downgraded to CRTP on 04-25-2013 by Dr Rayann Heman   HAND SURGERY     left   [place]  CHANGE N/A 04/25/2013   Procedure: IMPLANTABLE CARDIOVERTER DEFIBRILLATOR GENERATOR CHANGE;  Surgeon: Thompson Grayer, MD;  Location: [facility] CATH LAB;  Service: Cardiovascular;  Laterality: N/A;   PACEMAKER INSERTION  2006   REPLACEMENT TOTAL KNEE  2000, 2011 x2   bilateral       Family History  Problem Relation Age of Onset   Emphysema Father        smoker   Arthritis Father    Bone cancer Maternal Uncle    Brain cancer Brother        step-brother   Breast cancer Cousin        maternal cousin   Colon cancer Neg Hx     Social History   Tobacco Use   Smoking status:  Former    Packs/day: 1.00    Years: 8.00    Pack years: 8.00    Types: Cigarettes    Quit date: 10/10/1997    Years since quitting: 23.5   Smokeless tobacco: Never  Vaping Use   Vaping Use: Never used  Substance Use Topics   Alcohol use: Yes    Comment: few times a week   Drug use: No    Home Medications Prior to Admission medications   Medication Sig Start Date End Date Taking? Authorizing Provider  diclofenac Sodium (VOLTAREN) 1 % GEL Apply 4 g topically 4 (four) times daily. 04/24/21  Yes Karmelo Bass S, PA  methocarbamol (ROBAXIN) 500 MG tablet Take 1 tablet (500 mg total) by mouth 2 (two) times daily. 04/24/21  Yes Hannibal Skalla S, PA  carvedilol (COREG) 25 MG tablet Take 12.5 mg by mouth 2 (two) times daily.    [provider]  cholecalciferol (VITAMIN D3) 25 MCG (1000 UNIT) tablet Take 1,000 Units by mouth daily.    [provider]  clonazePAM (KLONOPIN) 0.5 MG tablet Take 0.5 mg by mouth at bedtime as needed (sleep). 12/10/20   [provider]  furosemide (LASIX) 20 MG tablet Take 1 tablet (20 mg total) by mouth daily. 02/19/18   End, Harrell Gave, MD  Rivaroxaban (XARELTO) 20 MG TABS tablet Take 1 tablet (20 mg total) by mouth daily. 05/13/13   Larey Dresser, MD  simvastatin (ZOCOR) 80 MG tablet Take 40 mg by mouth in the morning and at bedtime.    [provider]  traZODone (DESYREL) 50 MG tablet Take 50 mg by mouth at bedtime as needed for sleep.    [provider]  zinc gluconate 50 MG tablet Take 50 mg by mouth daily.    [provider]    Allergies    Lisinopril and Niacin  Review of Systems   Review of Systems  Constitutional:  Negative for chills and fever.  HENT:  Negative for congestion.   Eyes:  Negative for pain.  Respiratory:  Negative for cough and shortness of breath.   Cardiovascular:  Positive for chest pain. Negative for leg swelling.  Gastrointestinal:  Negative for abdominal pain and vomiting.   Genitourinary:  Negative for dysuria.  Musculoskeletal:  Negative for myalgias.  Skin:  Negative for rash.  Neurological:  Negative for dizziness and headaches.   Physical Exam Updated Vital Signs BP (!) 118/59   Pulse (!) 59   Temp 98.3 F (36.8 C) (Oral)   Resp 17   Ht 6\' 5"  (1.956 m)   Wt 108.9 kg   SpO2 99%   BMI 28.46 kg/m   Physical Exam Vitals and nursing note reviewed.  Constitutional:  General: He is not in acute distress. HENT:     Head: Normocephalic and atraumatic.     Nose: Nose normal.  Eyes:     General: No scleral icterus. Cardiovascular:     Rate and Rhythm: Normal rate and regular rhythm.     Pulses: Normal pulses.     Heart sounds: Normal heart sounds.     Comments: BL radial artery pulses 3+ and symmetric Pulmonary:     Effort: Pulmonary effort is normal. No respiratory distress.     Breath sounds: No wheezing.  Abdominal:     Palpations: Abdomen is soft.     Tenderness: There is no abdominal tenderness.  Musculoskeletal:     Cervical back: Normal range of motion.     Right lower leg: No edema.     Left lower leg: No edema.  Skin:    General: Skin is warm and dry.     Capillary Refill: Capillary refill takes less than 2 seconds.  Neurological:     Mental Status: He is alert. Mental status is at baseline.  Psychiatric:        Mood and Affect: Mood normal.        Behavior: Behavior normal.    ED Results / Procedures / Treatments   Labs (all labs ordered are listed, but only abnormal results are displayed) Labs Reviewed  CBC WITH DIFFERENTIAL/PLATELET - Abnormal; Notable for the following components:      Result Value   RBC 3.95 (*)    MCH 34.7 (*)    All other components within normal limits  BRAIN NATRIURETIC PEPTIDE - Abnormal; Notable for the following components:   B Natriuretic Peptide 139.3 (*)    All other components within normal limits  BASIC METABOLIC PANEL  TROPONIN I (HIGH SENSITIVITY)  TROPONIN I (HIGH SENSITIVITY)     EKG EKG Interpretation  Date/Time:  Saturday April 24 2021 14:24:50 EDT Ventricular Rate:  63 PR Interval:    QRS Duration: 136 QT Interval:  428 QTC Calculation: 437 R Axis:   -76 Text Interpretation: VENTRICULAR PACED RHYTHM No significant change since last tracing Confirmed by Blanchie Dessert [phone]) on 04/24/2021 3:05:07 PM  Radiology DG Chest 2 View  Result Date: 04/24/2021 CLINICAL DATA:  Chest pain x 1 wk, worse today. Ex smoker. Hx afib, CAD.CP EXAM: CHEST - 2 VIEW COMPARISON:  None. FINDINGS: The heart size and mediastinal contours are within normal limits. Both lungs are clear. The visualized skeletal structures are unremarkable. Degenerative osteophytosis of the spine. IMPRESSION: No acute cardiopulmonary process. Electronically Signed   By: Suzy Bouchard M.D.   On: 04/24/2021 16:15    Procedures Procedures   Medications Ordered in ED Medications  aspirin chewable tablet 324 mg (324 mg Oral Given 04/24/21 1531)  acetaminophen (TYLENOL) tablet 650 mg (650 mg Oral Given 04/24/21 1533)    ED Course  I have reviewed the triage vital signs and the nursing notes.  Pertinent labs & imaging results that were available during my care of the patient were reviewed by me and considered in my medical decision making (see chart for details).    MDM Rules/Calculators/A&P                          Patient is a 75 year old male with past medical history detailed in HPI presented today for atypical sounding chest pain that is nonexertional nonpleuritic  Seems to be worse with certain movements and positions.  Patient is  with some left-sided anterior chest wall tenderness to palpation.  I am guarded about whether this is completely reproducing his symptoms.  Denies any lightheadedness or dizziness no shortness of breath nausea or diaphoresis.  Ultimately has very atypical sounding chest pain.  Given his age and cardiac conditions will obtain delta troponins  however.  troponins x2 are 6.  No delta. BMP unremarkable.  CBC w/o leukocytosis  or anemia.  BNP 140 - presentation not consistent with CHF exacerbation.   Chest x-ray without any evidence of pneumonia or pneumothorax.  Unremarkable  EKG is ventricular paced rhythm appears to be in atrial fibrillation in the background.  This is not a new condition for patient.  He is on anticoagulation.  Patient reassessed pain is much improved after Tylenol and aspirin. Continues to have palpable left anterior chest tenderness.  I discussed this case with my attending physician who cosigned this note including patient's presenting symptoms, physical exam, and planned diagnostics and interventions. Attending physician stated agreement with plan or made changes to plan which were implemented.   Attending physician assessed patient at bedside.   We will discharge patient home.  He will follow closely with his cardiologist and PCP.  Final Clinical Impression(s) / ED Diagnoses Final diagnoses:  Left-sided chest pain  Atypical chest pain    Rx / DC Orders ED Discharge Orders          Ordered    diclofenac Sodium (VOLTAREN) 1 % GEL  4 times daily        04/24/21 1642    methocarbamol (ROBAXIN) 500 MG tablet  2 times daily        04/24/21 1642             Pati Gallo [place], [place] 04/24/21 1915    Drenda Freeze, MD 04/25/21 1515

## 2021-04-27 ENCOUNTER — Telehealth (HOSPITAL_COMMUNITY): Payer: Self-pay | Admitting: *Deleted

## 2021-04-27 NOTE — Telephone Encounter (Signed)
Patient given detailed instructions per Myocardial Perfusion Study Information Sheet for the test on 05/03/21 at 0800. Patient notified to arrive 15 minutes early and that it is imperative to arrive on time for appointment to keep from having the test rescheduled.  If you need to cancel or reschedule your appointment, please call the office within 24 hours of your appointment. . Patient verbalized understanding.Trevor Wilkie, [place] Patient did not want Press photographer.

## 2021-05-03 ENCOUNTER — Ambulatory Visit (HOSPITAL_COMMUNITY): Payer: PPO | Attending: Internal Medicine

## 2021-05-03 ENCOUNTER — Other Ambulatory Visit: Payer: Self-pay

## 2021-05-03 ENCOUNTER — Ambulatory Visit (HOSPITAL_BASED_OUTPATIENT_CLINIC_OR_DEPARTMENT_OTHER): Payer: PPO

## 2021-05-03 DIAGNOSIS — I4821 Permanent atrial fibrillation: Secondary | ICD-10-CM | POA: Insufficient documentation

## 2021-05-03 DIAGNOSIS — G4733 Obstructive sleep apnea (adult) (pediatric): Secondary | ICD-10-CM

## 2021-05-03 DIAGNOSIS — R079 Chest pain, unspecified: Secondary | ICD-10-CM

## 2021-05-03 DIAGNOSIS — I5022 Chronic systolic (congestive) heart failure: Secondary | ICD-10-CM | POA: Insufficient documentation

## 2021-05-03 DIAGNOSIS — I442 Atrioventricular block, complete: Secondary | ICD-10-CM | POA: Insufficient documentation

## 2021-05-03 LAB — MYOCARDIAL PERFUSION IMAGING
LV dias vol: 155 mL (ref 62–150)
LV sys vol: 76 mL
Peak HR: 64 {beats}/min
Rest HR: 61 {beats}/min
SDS: 1
SRS: 0
SSS: 1
TID: 1.06

## 2021-05-03 LAB — ECHOCARDIOGRAM COMPLETE
Area-P 1/2: 4.24 cm2
Height: 77 in
P 1/2 time: 664 msec
S' Lateral: 4.5 cm
Weight: 3936 oz

## 2021-05-03 MED ORDER — REGADENOSON 0.4 MG/5ML IV SOLN
0.4000 mg | Freq: Once | INTRAVENOUS | Status: AC
  Administered 2021-05-03: 0.4 mg via INTRAVENOUS

## 2021-05-03 MED ORDER — TECHNETIUM TC 99M TETROFOSMIN IV KIT
10.6000 | PACK | Freq: Once | INTRAVENOUS | Status: AC | PRN
  Administered 2021-05-03: 10.6 via INTRAVENOUS
  Filled 2021-05-03: qty 11

## 2021-05-03 MED ORDER — TECHNETIUM TC 99M TETROFOSMIN IV KIT
31.7000 | PACK | Freq: Once | INTRAVENOUS | Status: AC | PRN
  Administered 2021-05-03: 31.7 via INTRAVENOUS
  Filled 2021-05-03: qty 32

## 2021-05-03 MED ORDER — PERFLUTREN LIPID MICROSPHERE
2.0000 mL | INTRAVENOUS | Status: AC | PRN
  Administered 2021-05-03: 2 mL via INTRAVENOUS

## 2021-05-11 DIAGNOSIS — G4733 Obstructive sleep apnea (adult) (pediatric): Secondary | ICD-10-CM | POA: Diagnosis not present

## 2021-05-26 ENCOUNTER — Telehealth: Payer: Self-pay | Admitting: Cardiology

## 2021-05-26 NOTE — Telephone Encounter (Signed)
   Pt is requesting to speak with Dr. Kandis Mannan nurse. Pt said he just have something to ask her

## 2021-05-26 NOTE — Telephone Encounter (Signed)
Spoke with the patient who states that he was in the shower this morning and noticed that his leg was bleeding badly. He states that it appeared a blood vessel had burst. He states that he wrapped above his leg with a tourniquet and applied pressure to his leg and was able to stop the bleeding. Patient is currently taking xarelto. He wanted to make Dr. Marlou Porch aware. Advised the patient that he did the correct thing by applying pressure. Also advised patient on elevation of his leg if it were to occur again.

## 2021-06-07 ENCOUNTER — Ambulatory Visit: Payer: PPO | Admitting: Student

## 2021-06-11 DIAGNOSIS — G4733 Obstructive sleep apnea (adult) (pediatric): Secondary | ICD-10-CM | POA: Diagnosis not present

## 2021-06-29 ENCOUNTER — Ambulatory Visit (INDEPENDENT_AMBULATORY_CARE_PROVIDER_SITE_OTHER): Payer: PPO

## 2021-06-29 DIAGNOSIS — I442 Atrioventricular block, complete: Secondary | ICD-10-CM

## 2021-06-29 LAB — CUP PACEART REMOTE DEVICE CHECK
Battery Remaining Longevity: 143 mo
Battery Voltage: 3.15 V
Brady Statistic AP VP Percent: 0 %
Brady Statistic AP VS Percent: 0 %
Brady Statistic AS VP Percent: 99.95 %
Brady Statistic AS VS Percent: 0.05 %
Brady Statistic RA Percent Paced: 0 %
Brady Statistic RV Percent Paced: 99.95 %
Date Time Interrogation Session: 20220920014539
Implantable Lead Implant Date: 20050105
Implantable Lead Implant Date: 20061130
Implantable Lead Implant Date: 20061130
Implantable Lead Implant Date: 20061130
Implantable Lead Location: 753858
Implantable Lead Location: 753859
Implantable Lead Location: 753860
Implantable Lead Location: 753860
Implantable Lead Model: 4076
Implantable Lead Model: 4194
Implantable Lead Model: 5076
Implantable Lead Model: 6949
Implantable Pulse Generator Implant Date: 20220322
Lead Channel Impedance Value: 342 Ohm
Lead Channel Impedance Value: 361 Ohm
Lead Channel Impedance Value: 456 Ohm
Lead Channel Impedance Value: 513 Ohm
Lead Channel Impedance Value: 532 Ohm
Lead Channel Impedance Value: 570 Ohm
Lead Channel Impedance Value: 589 Ohm
Lead Channel Impedance Value: 703 Ohm
Lead Channel Impedance Value: 779 Ohm
Lead Channel Pacing Threshold Amplitude: 0.375 V
Lead Channel Pacing Threshold Pulse Width: 0.4 ms
Lead Channel Sensing Intrinsic Amplitude: 0.75 mV
Lead Channel Setting Pacing Amplitude: 1.25 V
Lead Channel Setting Pacing Amplitude: 2 V
Lead Channel Setting Pacing Pulse Width: 0.4 ms
Lead Channel Setting Pacing Pulse Width: 0.4 ms
Lead Channel Setting Sensing Sensitivity: 5.6 mV

## 2021-07-06 NOTE — Progress Notes (Signed)
Remote pacemaker transmission.   

## 2021-07-11 DIAGNOSIS — G4733 Obstructive sleep apnea (adult) (pediatric): Secondary | ICD-10-CM | POA: Diagnosis not present

## 2021-08-11 DIAGNOSIS — G4733 Obstructive sleep apnea (adult) (pediatric): Secondary | ICD-10-CM | POA: Diagnosis not present

## 2021-09-07 ENCOUNTER — Encounter: Payer: Self-pay | Admitting: Cardiology

## 2021-09-08 ENCOUNTER — Encounter: Payer: Self-pay | Admitting: *Deleted

## 2021-09-10 DIAGNOSIS — G4733 Obstructive sleep apnea (adult) (pediatric): Secondary | ICD-10-CM | POA: Diagnosis not present

## 2021-09-28 ENCOUNTER — Ambulatory Visit (INDEPENDENT_AMBULATORY_CARE_PROVIDER_SITE_OTHER): Payer: PPO

## 2021-09-28 DIAGNOSIS — I442 Atrioventricular block, complete: Secondary | ICD-10-CM | POA: Diagnosis not present

## 2021-09-28 LAB — CUP PACEART REMOTE DEVICE CHECK
Battery Remaining Longevity: 139 mo
Battery Voltage: 3.1 V
Brady Statistic AP VP Percent: 0 %
Brady Statistic AP VS Percent: 0 %
Brady Statistic AS VP Percent: 99.93 %
Brady Statistic AS VS Percent: 0.07 %
Brady Statistic RA Percent Paced: 0 %
Brady Statistic RV Percent Paced: 99.93 %
Date Time Interrogation Session: 20221220014316
Implantable Lead Implant Date: 20050105
Implantable Lead Implant Date: 20061130
Implantable Lead Implant Date: 20061130
Implantable Lead Implant Date: 20061130
Implantable Lead Location: 753858
Implantable Lead Location: 753859
Implantable Lead Location: 753860
Implantable Lead Location: 753860
Implantable Lead Model: 4076
Implantable Lead Model: 4194
Implantable Lead Model: 5076
Implantable Lead Model: 6949
Implantable Pulse Generator Implant Date: 20220322
Lead Channel Impedance Value: 323 Ohm
Lead Channel Impedance Value: 361 Ohm
Lead Channel Impedance Value: 437 Ohm
Lead Channel Impedance Value: 513 Ohm
Lead Channel Impedance Value: 532 Ohm
Lead Channel Impedance Value: 551 Ohm
Lead Channel Impedance Value: 570 Ohm
Lead Channel Impedance Value: 646 Ohm
Lead Channel Impedance Value: 741 Ohm
Lead Channel Pacing Threshold Amplitude: 0.375 V
Lead Channel Pacing Threshold Pulse Width: 0.4 ms
Lead Channel Sensing Intrinsic Amplitude: 0.75 mV
Lead Channel Setting Pacing Amplitude: 1.25 V
Lead Channel Setting Pacing Amplitude: 2 V
Lead Channel Setting Pacing Pulse Width: 0.4 ms
Lead Channel Setting Pacing Pulse Width: 0.4 ms
Lead Channel Setting Sensing Sensitivity: 5.6 mV

## 2021-10-05 DIAGNOSIS — R972 Elevated prostate specific antigen [PSA]: Secondary | ICD-10-CM | POA: Diagnosis not present

## 2021-10-05 NOTE — Progress Notes (Signed)
Cardiology Office Note Date:  10/07/2021  Patient ID:  Thomas, Ibarra 05/15/1946, MRN [id number] PCP:  Ginger Organ., MD  Cardiologist:  Dr.Skains Electrophysiologist: Dr. Rayann Heman    Chief Complaint: 6 mo  History of Present Illness: [name] is a 75 y.o. male with history of CHB (hx of AV node ablation) w/PPM, permanent AFib, OSA, chronic CHF (systolic) with recovery/normalization of EF, HLD, AAA  He comes in today to be seen for Dr. Rayann Heman, last seen by him in June 2022, noted that with CRT EF had normalized though presented that day with c/o fatigue, SOB, edema. Planned for an echo and if EF was back down to see EP APP for device optimization efforts  His echo noted LVEF 50-55%, no WMA, , RV pressure mod elevated 49.8 (not new), LA/RA severely dilated, no significant VHD. Stress test with no ischemia, low risk  ER visit 04/24/21 with CP, felt to be atypical in description, HS Trop x2 were 6, not felt to be volume OL< labs largely unremarkable, discharged from the ER  TODAY He is doing pretty well, reports he has had the same SOB for 10 years, no escalation or change no palpitations No near syncope or syncope Infrequently he has a chest discomfort with no particular pattern, is not positional or exertional, last a day or so and when he has it, feels worse when he takes a deep breath, this is also not new or changing  He has for years gotten occasional nose bleeds, though new he has had a couple episodes of bleeding from his leg, 2x, both in the shower and bother at the same spot, no noted trauma, but bleeds quite a lot.   Device information MDT CRT-P implanted 04/25/2013, gen change 12/29/20 S/p AV Node ablation  Past Medical History:  Diagnosis Date   AAA (abdominal aortic aneurysm)    Arthritis    Atrial fibrillation (HCC)    permanent   CAD (coronary artery disease)    Chronic systolic dysfunction of left ventricle    s/p BiV ICD//downgrade to CRT-P  (medtronic) on 04-25-2013 by Dr Rayann Heman   Colon polyps    Complete heart block (HCC)    s/p AV nodal ablation for afib   Dysphagia    Elevated LFTs    Fatigue    GERD (gastroesophageal reflux disease)    Gout    Hemochromatosis    Hyperlipidemia    IFG (impaired fasting glucose)    Osteoarthritis    Sleep apnea     Past Surgical History:  Procedure Laterality Date   AV NODE ABLATION     BIV PACEMAKER GENERATOR CHANGE OUT  04-25-2013   downgrade of previously implanted CRTD to Medtronic CRTP by Dr Rayann Heman.  Previously implanted LV and 4076 RV leads were used   BIV PACEMAKER GENERATOR CHANGEOUT N/A 12/29/2020   Procedure: BIV PACEMAKER GENERATOR CHANGEOUT;  Surgeon: Thompson Grayer, MD;  Location: [facility] LAB;  Service: Cardiovascular;  Laterality: N/A;   CARDIAC DEFIBRILLATOR PLACEMENT  2007, 2010   MDT BiV ICD,  [phone] RV lead, though pace/ sense is a Medtronic model 4076 - 58  lead; downgraded to [place] on 04-25-2013 by Dr Rayann Heman   HAND SURGERY     left   HERNIA REPAIR     HYDROCELE EXCISION     IMPLANTABLE CARDIOVERTER DEFIBRILLATOR GENERATOR CHANGE N/A 04/25/2013   Procedure: IMPLANTABLE CARDIOVERTER DEFIBRILLATOR GENERATOR CHANGE;  Surgeon: Thompson Grayer, MD;  Location[facility] C[facility];  Service: Cardiovascular;  Laterality: N/A;   PACEMAKER INSERTION  2006   REPLACEMENT TOTAL KNEE  2000, 2011 x2   bilateral    Current Outpatient Medications  Medication Sig Dispense Refill   carvedilol (COREG) 25 MG tablet Take 12.5 mg by mouth 2 (two) times daily.     cholecalciferol (VITAMIN D3) 25 MCG (1000 UNIT) tablet Take 1,000 Units by mouth daily.     clonazePAM (KLONOPIN) 0.5 MG tablet Take 0.5 mg by mouth at bedtime as needed (sleep).     furosemide (LASIX) 20 MG tablet Take 1 tablet (20 mg total) by mouth daily. 90 tablet 3   Rivaroxaban (XARELTO) 20 MG TABS tablet Take 1 tablet (20 mg total) by mouth daily. 30 tablet 3   simvastatin (ZOCOR) 80 MG tablet Take 40 mg by mouth in the  morning and at bedtime.     traZODone (DESYREL) 50 MG tablet Take 50 mg by mouth at bedtime as needed for sleep.     zinc gluconate 50 MG tablet Take 50 mg by mouth daily.     Current Facility-Administered Medications  Medication Dose Route Frequency Provider Last Rate Last Admin   0.9 %  sodium chloride infusion  500 mL Intravenous Continuous Irene Shipper, MD        Allergies:   Lisinopril and Niacin   Social History:  The patient  reports that he quit smoking about 24 years ago. His smoking use included cigarettes. He has a 8.00 pack-year smoking history. He has never used smokeless tobacco. He reports current alcohol use. He reports that he does not use drugs.   Family History:  The patient's family history includes Arthritis in his father; Bone cancer in his maternal uncle; Brain cancer in his brother; Breast cancer in his cousin; Emphysema in his father.  ROS:  Please see the history of present illness.    All other systems are reviewed and otherwise negative.   PHYSICAL EXAM:  VS:  BP 120/70    Pulse 61    Ht 6\' 5"  (1.956 m)    Wt 234 lb (106.1 kg)    SpO2 96%    BMI 27.75 kg/m  BMI: Body mass index is 27.75 kg/m. Well nourished, well developed, in no acute distress HEENT: normocephalic, atraumatic Neck: no JVD, carotid bruits or masses Cardiac:  RRR; (paced) no significant murmurs, no rubs, or gallops Lungs:  CTA b/l, no wheezing, rhonchi or rales Abd: soft, nontender MS: no deformity or atrophy Ext: e has chronic looking skin changes b/l L>R with some non-pitting type swelling (trace), he has numerous spider veins b/l, one of the superficial small veins has a scab on it, this he says is where it has bled from Skin: warm and dry, no rash Neuro:  No gross deficits appreciated Psych: euthymic mood, full affect  PPM site is stable, no tethering or discomfort   EKG:  not done today  Device interrogation done today and reviewed by myself:  Battery and lead measurements are  stable No R waves at 40 (AV node ablation) No HVR episodes   05/03/21: stress myoview Nuclear stress EF: 51%. The left ventricular ejection fraction is mildly decreased (45-54%). No T wave inversion was noted during stress. There was no ST segment deviation noted during stress. This is a low risk study.   No reversible ischemia. LVEF 51% with normal wall motion. This is a low risk study. No change compared to prior study in 2010.   05/03/21: TTE IMPRESSIONS  1. Left ventricular ejection fraction, by estimation, is 50 to 55%. The  left ventricle has low normal function. The left ventricle has no regional  wall motion abnormalities. There is mild left ventricular hypertrophy.  Left ventricular diastolic  parameters are indeterminate.   2. Right ventricular systolic function is normal. The right ventricular  size is mildly enlarged. There is moderately elevated pulmonary artery  systolic pressure. The estimated right ventricular systolic pressure is  51.0 mmHg.   3. Left atrial size was severely dilated.   4. Right atrial size was severely dilated.   5. The mitral valve is normal in structure. Trivial mitral valve  regurgitation. No evidence of mitral stenosis.   6. Tricuspid valve regurgitation is mild to moderate.   7. The aortic valve is normal in structure. Aortic valve regurgitation is  trivial. No aortic stenosis is present.   8. The inferior vena cava is dilated in size with <50% respiratory  variability, suggesting right atrial pressure of 15 mmHg.   Recent Labs: 04/24/2021: B Natriuretic Peptide 139.3; BUN 18; Creatinine, Ser 1.08; Hemoglobin 13.7; Platelets 188; Potassium 3.6; Sodium 140  No results found for requested labs within last 8760 hours.   CrCl cannot be calculated (Patient's most recent lab result is older than the maximum 21 days allowed.).   Wt Readings from Last 3 Encounters:  10/07/21 234 lb (106.1 kg)  05/03/21 246 lb (111.6 kg)  04/24/21 240 lb  (108.9 kg)     Other studies reviewed: Additional studies/records reviewed today include: summarized above  ASSESSMENT AND PLAN:  PPM Intact function, no programming changes made  NICM Chronic CHF Recovered LVEF No symptoms or exam findings of volume OL  Permanent AFib CHA2DS2Vasc is 2, on Xarelto, appropriately dosed Hx of AVNode ablation   5. LLE bleeding He has ascab on one of the numerous spider veins, and superficial veins on his leg I have recommended that he cover this in the shower until completely healed and avoid trauma, also recommend that he see his PMD for vascular evaluation if any intervention is warranted.   Disposition: F/u with remotes as usual in clinic in 1 year with EP, he is due to see Dr. Marlou Porch  Current medicines are reviewed at length with the patient today.  The patient did not have any concerns regarding medicines.  Venetia Night, PA-C 10/07/2021 6:02 PM     [address] [place] [address] [phone] (office)  [phone] (fax)

## 2021-10-06 DIAGNOSIS — G4733 Obstructive sleep apnea (adult) (pediatric): Secondary | ICD-10-CM | POA: Diagnosis not present

## 2021-10-07 ENCOUNTER — Other Ambulatory Visit: Payer: Self-pay

## 2021-10-07 ENCOUNTER — Ambulatory Visit: Payer: PPO | Admitting: Physician Assistant

## 2021-10-07 ENCOUNTER — Encounter: Payer: Self-pay | Admitting: Physician Assistant

## 2021-10-07 VITALS — BP 120/70 | HR 61 | Ht 77.0 in | Wt 234.0 lb

## 2021-10-07 DIAGNOSIS — I442 Atrioventricular block, complete: Secondary | ICD-10-CM

## 2021-10-07 DIAGNOSIS — I428 Other cardiomyopathies: Secondary | ICD-10-CM | POA: Diagnosis not present

## 2021-10-07 DIAGNOSIS — I4821 Permanent atrial fibrillation: Secondary | ICD-10-CM

## 2021-10-07 DIAGNOSIS — Z950 Presence of cardiac pacemaker: Secondary | ICD-10-CM | POA: Diagnosis not present

## 2021-10-07 DIAGNOSIS — I5022 Chronic systolic (congestive) heart failure: Secondary | ICD-10-CM | POA: Diagnosis not present

## 2021-10-07 NOTE — Patient Instructions (Signed)
Medication Instructions:   Your physician recommends that you continue on your current medications as directed. Please refer to the Current Medication list given to you today.  *If you need a refill on your cardiac medications before your next appointment, please call your pharmacy*   Lab Work: [facility]   If you have labs (blood work) drawn today and your tests are completely normal, you will receive your results only by: [facility] (if you have MyChart) OR A paper copy in the mail If you have any lab test that is abnormal or we need to change your treatment, we will call you to review the results.   Testing/Procedures: NONE ORDERED  TODAY    Follow-Up: At [facility], you and your health needs are our priority.  As part of our continuing mission to provide you with exceptional heart care, we have created designated Provider Care Teams.  These Care Teams include your primary Cardiologist (physician) and Advanced Practice Providers (APPs -  Physician Assistants and Nurse Practitioners) who all work together to provide you with the care you need, when you need it.  We recommend signing up for the patient portal called "MyChart".  Sign up information is provided on this After Visit Summary.  MyChart is used to connect with patients for Virtual Visits (Telemedicine).  Patients are able to view lab/test results, encounter notes, upcoming appointments, etc.  Non-urgent messages can be sent to your provider as well.   To learn more about what you can do with MyChart, go to [email or web address].    Your next appointment:  DR Marlou Porch OR  WITH APP IN January   1 year(s)  The format for your next appointment:   In Person  Provider:   Lars Mage, MD     Other Instructions

## 2021-10-07 NOTE — Progress Notes (Signed)
Remote pacemaker transmission.   

## 2021-10-08 DIAGNOSIS — Z20822 Contact with and (suspected) exposure to covid-19: Secondary | ICD-10-CM | POA: Diagnosis not present

## 2021-10-11 DIAGNOSIS — G4733 Obstructive sleep apnea (adult) (pediatric): Secondary | ICD-10-CM | POA: Diagnosis not present

## 2021-10-14 DIAGNOSIS — U071 COVID-19: Secondary | ICD-10-CM | POA: Diagnosis not present

## 2021-10-14 DIAGNOSIS — Z20822 Contact with and (suspected) exposure to covid-19: Secondary | ICD-10-CM | POA: Diagnosis not present

## 2021-10-18 DIAGNOSIS — Z20822 Contact with and (suspected) exposure to covid-19: Secondary | ICD-10-CM | POA: Diagnosis not present

## 2021-10-18 DIAGNOSIS — R030 Elevated blood-pressure reading, without diagnosis of hypertension: Secondary | ICD-10-CM | POA: Diagnosis not present

## 2021-10-18 DIAGNOSIS — R0602 Shortness of breath: Secondary | ICD-10-CM | POA: Diagnosis not present

## 2021-10-18 DIAGNOSIS — U071 COVID-19: Secondary | ICD-10-CM | POA: Diagnosis not present

## 2021-10-25 DIAGNOSIS — R972 Elevated prostate specific antigen [PSA]: Secondary | ICD-10-CM | POA: Diagnosis not present

## 2021-10-27 ENCOUNTER — Encounter: Payer: Self-pay | Admitting: Cardiology

## 2021-10-27 ENCOUNTER — Other Ambulatory Visit: Payer: Self-pay

## 2021-10-27 ENCOUNTER — Ambulatory Visit: Payer: PPO | Admitting: Cardiology

## 2021-10-27 DIAGNOSIS — I442 Atrioventricular block, complete: Secondary | ICD-10-CM | POA: Diagnosis not present

## 2021-10-27 DIAGNOSIS — D6869 Other thrombophilia: Secondary | ICD-10-CM

## 2021-10-27 DIAGNOSIS — I8390 Asymptomatic varicose veins of unspecified lower extremity: Secondary | ICD-10-CM | POA: Insufficient documentation

## 2021-10-27 DIAGNOSIS — I428 Other cardiomyopathies: Secondary | ICD-10-CM | POA: Diagnosis not present

## 2021-10-27 DIAGNOSIS — E7800 Pure hypercholesterolemia, unspecified: Secondary | ICD-10-CM | POA: Diagnosis not present

## 2021-10-27 DIAGNOSIS — I4811 Longstanding persistent atrial fibrillation: Secondary | ICD-10-CM | POA: Diagnosis not present

## 2021-10-27 DIAGNOSIS — G4733 Obstructive sleep apnea (adult) (pediatric): Secondary | ICD-10-CM | POA: Diagnosis not present

## 2021-10-27 DIAGNOSIS — I482 Chronic atrial fibrillation, unspecified: Secondary | ICD-10-CM

## 2021-10-27 DIAGNOSIS — I83892 Varicose veins of left lower extremities with other complications: Secondary | ICD-10-CM

## 2021-10-27 DIAGNOSIS — I4891 Unspecified atrial fibrillation: Secondary | ICD-10-CM | POA: Insufficient documentation

## 2021-10-27 NOTE — Progress Notes (Signed)
Cardiology Office Note:    Date:  10/27/2021   ID:  [name], DOB 1946-07-06, MRN [id number]  PCP:  Ginger Organ., MD   [facility] Providers Cardiologist:  Candee Furbish, MD     Referring MD: Ginger Organ., MD    History of Present Illness:    [name] is a 76 y.o. male here for follow-up of complete heart block with history of AV nodal ablation status post pacemaker with prior atrial fibrillation obstructive sleep apnea chronic systolic heart failure with recovery of EF, AAA, hyperlipidemia.  After CRT therapy EF had normalized.  He has had some fatigue and shortness of breath despite this.  RV pressure moderately elevated to approximately 50.  Both atria were severely dilated.  EF was 50 to 55%.  Device information MDT CRT-P implanted 04/25/2013, gen change 12/29/20 S/p AV Node ablation  Overall he has been doing fairly well.  Breathing is about the same, not perfect but he is overall satisfied.  Main issues he addressed with me and showed me pictures of were bleeding from varicose vein/ nose bleeding.     Past Medical History:  Diagnosis Date   AAA (abdominal aortic aneurysm)    Arthritis    Atrial fibrillation (HCC)    permanent   CAD (coronary artery disease)    Chronic systolic dysfunction of left ventricle    s/p BiV ICD//downgrade to CRT-P (medtronic) on 04-25-2013 by Dr Rayann Heman   Colon polyps    Complete heart block (HCC)    s/p AV nodal ablation for afib   Dysphagia    Elevated LFTs    Fatigue    GERD (gastroesophageal reflux disease)    Gout    Hemochromatosis    Hyperlipidemia    IFG (impaired fasting glucose)    Osteoarthritis    Sleep apnea     Past Surgical History:  Procedure Laterality Date   AV NODE ABLATION     BIV PACEMAKER GENERATOR CHANGE OUT  04-25-2013   downgrade of previously implanted CRTD to Medtronic CRTP by Dr Rayann Heman.  Previously implanted LV and 4076 RV leads were used   BIV PACEMAKER GENERATOR CHANGEOUT  N/A 12/29/2020   Procedure: BIV PACEMAKER GENERATOR CHANGEOUT;  Surgeon: Thompson Grayer, MD;  Location: [place] [facility];  Service: Cardiovascular;  Laterality: N/A;   CARDIAC DEFIBRILLATOR PLACEMENT  2007, 2010   MDT BiV ICD,  [phone] RV lead, though pace/ sense is a Medtronic model 4076 - 58  lead; downgraded to [employer]rey on 04-25-2013 by Dr Rayann Heman   HAND SURGERY     left   HERNIA REPAIR     HYDROCELE EXCISION     IMPLANTABLE CARDIOVERTER DEFIBRILLATOR GENERATOR CHANGE N/A 04/25/2013   Procedure: IMPLANTABLE CARDIOVERTER DEFIBRILLATOR GENERATOR CHANGE;  Surgeon: Thompson Grayer, MD;  Location: [facility] CATH LAB;  Service: Cardiovascular;  Laterality: N/A;   PACEMAKER INSERTION  2006   REPLACEMENT TOTAL KNEE  2000, 2011 x2   bilateral    Current Medications: Current Meds  Medication Sig   carvedilol (COREG) 25 MG tablet Take 12.5 mg by mouth 2 (two) times daily.   clonazePAM (KLONOPIN) 0.5 MG tablet Take 0.5 mg by mouth at bedtime as needed (sleep).   furosemide (LASIX) 20 MG tablet Take 1 tablet (20 mg total) by mouth daily.   Rivaroxaban (XARELTO) 20 MG TABS tablet Take 1 tablet (20 mg total) by mouth daily.   simvastatin (ZOCOR) 80 MG tablet Take 40 mg by mouth in  the morning and at bedtime.   traZODone (DESYREL) 50 MG tablet Take 50 mg by mouth at bedtime as needed for sleep.   Current Facility-Administered Medications for the 10/27/21 encounter (Office Visit) with Jerline Pain, MD  Medication   0.9 %  sodium chloride infusion     Allergies:   Lisinopril and Niacin   Social History   Socioeconomic History   Marital status: Married    Spouse name: Not on file   Number of children: 2   Years of education: Not on file   Highest education level: Not on file  Occupational History   Occupation: sales  Tobacco Use   Smoking status: Former    Packs/day: 1.00    Years: 8.00    Pack years: 8.00    Types: Cigarettes    Quit date: 10/10/1997    Years since quitting: 24.0   Smokeless tobacco:  Never  Vaping Use   Vaping Use: Never used  Substance and Sexual Activity   Alcohol use: Yes    Comment: few times a week   Drug use: No   Sexual activity: Yes  Other Topics Concern   Not on file  Social History Narrative   Not on file   Social Determinants of Health   Financial Resource Strain: Not on file  Food Insecurity: Not on file  Transportation Needs: Not on file  Physical Activity: Not on file  Stress: Not on file  Social Connections: Not on file     Family History: The patient's family history includes Arthritis in his father; Bone cancer in his maternal uncle; Brain cancer in his brother; Breast cancer in his cousin; Emphysema in his father. There is no history of Colon cancer.  ROS:   Please see the history of present illness.     All other systems reviewed and are negative.  EKGs/Labs/Other Studies Reviewed:    The following studies were reviewed today: 05/03/21: stress myoview Nuclear stress EF: 51%. The left ventricular ejection fraction is mildly decreased (45-54%). No T wave inversion was noted during stress. There was no ST segment deviation noted during stress. This is a low risk study.   No reversible ischemia. LVEF 51% with normal wall motion. This is a low risk study. No change compared to prior study in 2010.  05/03/21: TTE IMPRESSIONS   1. Left ventricular ejection fraction, by estimation, is 50 to 55%. The  left ventricle has low normal function. The left ventricle has no regional  wall motion abnormalities. There is mild left ventricular hypertrophy.  Left ventricular diastolic  parameters are indeterminate.   2. Right ventricular systolic function is normal. The right ventricular  size is mildly enlarged. There is moderately elevated pulmonary artery  systolic pressure. The estimated right ventricular systolic pressure is  69.6 mmHg.   3. Left atrial size was severely dilated.   4. Right atrial size was severely dilated.   5. The mitral  valve is normal in structure. Trivial mitral valve  regurgitation. No evidence of mitral stenosis.   6. Tricuspid valve regurgitation is mild to moderate.   7. The aortic valve is normal in structure. Aortic valve regurgitation is  trivial. No aortic stenosis is present.   8. The inferior vena cava is dilated in size with <50% respiratory  variability, suggesting right atrial pressure of 15 mmHg.   Cardiac catheterization 2004-no CAD     Recent Labs: 04/24/2021: B Natriuretic Peptide 139.3; BUN 18; Creatinine, Ser 1.08; Hemoglobin 13.7; Platelets 188;  Potassium 3.6; Sodium 140  Recent Lipid Panel    Component Value Date/Time   CHOL 130 03/06/2019 1404   TRIG 76 03/06/2019 1404   HDL 61 03/06/2019 1404   CHOLHDL 2.1 03/06/2019 1404   CHOLHDL 2 01/08/2015 0843   VLDL 11.0 01/08/2015 0843   LDLCALC 54 03/06/2019 1404     Risk Assessment/Calculations:              Physical Exam:    VS:  BP 110/70 (BP Location: Left Arm, Patient Position: Sitting, Cuff Size: Normal)    Pulse 62    Ht 6\' 5"  (1.956 m)    Wt 241 lb (109.3 kg)    SpO2 95%    BMI 28.58 kg/m     Wt Readings from Last 3 Encounters:  10/27/21 241 lb (109.3 kg)  10/07/21 234 lb (106.1 kg)  05/03/21 246 lb (111.6 kg)     GEN:  Well nourished, well developed in no acute distress HEENT: Normal NECK: No JVD; No carotid bruits LYMPHATICS: No lymphadenopathy CARDIAC: RRR, no murmurs, no rubs, gallops RESPIRATORY:  Clear to auscultation without rales, wheezing or rhonchi  ABDOMEN: Soft, non-tender, non-distended MUSCULOSKELETAL:  No edema; No deformity  SKIN: Warm and dry NEUROLOGIC:  Alert and oriented x 3 PSYCHIATRIC:  Normal affect   ASSESSMENT:    1. NICM (nonischemic cardiomyopathy) ([place])   2. Longstanding persistent atrial fibrillation ([place])   3. Hypercoagulable state due to chronic atrial fibrillation ([place])   4. HYPERCHOLESTEROLEMIA   5. AV BLOCK, COMPLETE   6. Varicose veins of left lower extremity  with other complications    PLAN:    In order of problems listed above:  NICM (nonischemic cardiomyopathy) (HCC) Previously NYHA class II with chronic dyspnea for over 15 years.  Continuing with low-dose furosemide.  Low normal EF on most recent echocardiogram.  On carvedilol as well.  CRT therapy.  No changes made.  Atrial fibrillation Permanent atrial fibrillation status post AV nodal ablation.  Pacemaker placement.  He is on Xarelto.  He is pacemaker dependent.  Hypercoagulable state due to atrial fibrillation ([place]) Continues with Xarelto.  Lab work reviewed.  He has been having quite a few nosebleeds, requires packing to stop at times.  He also has varicose vein which causes significant leg bleeding.  It would be nice for him to be off of the Xarelto.  Thankfully, he is going to be seeing Dr. Quentin Ore.  I will place a consultation in for him to discuss potential watchman device.  This may be quite helpful for him given his bleeding issues.  HYPERCHOLESTEROLEMIA Simvastatin, no myalgias.  Prior LDL 57  AV BLOCK, COMPLETE Pacemaker dependent.  Post AV nodal ablation.  Varicose vein of leg Bleeding.  From left lower extremity.  He is seeing vascular and vein specialist soon.  High pressure, he showed me a picture of blood all over the shower floor.  He states that the stream of blood can hit the wall.  He is on Xarelto.        Medication Adjustments/Labs and Tests Ordered: Current medicines are reviewed at length with the patient today.  Concerns regarding medicines are outlined above.  Orders Placed This Encounter  Procedures   Ambulatory referral to Cardiac Electrophysiology   No orders of the defined types were placed in this encounter.   Patient Instructions  Medication Instructions:  Your physician recommends that you continue on your current medications as directed. Please refer to the Current Medication list given  to you today.  *If you need a refill on your cardiac  medications before your next appointment, please call your pharmacy*   Lab Work: NONE If you have labs (blood work) drawn today and your tests are completely normal, you will receive your results only by: [place] (if you have MyChart) OR A paper copy in the mail If you have any lab test that is abnormal or we need to change your treatment, we will call you to review the results.   Testing/Procedures: Your physician has requested that you see EP Provider.    Follow-Up: At [facility], you and your health needs are our priority.  As part of our continuing mission to provide you with exceptional heart care, we have created designated Provider Care Teams.  These Care Teams include your primary Cardiologist (physician) and Advanced Practice Providers (APPs -  Physician Assistants and Nurse Practitioners) who all work together to provide you with the care you need, when you need it.   Your next appointment:   1 year(s)  The format for your next appointment:   In Person  Provider:   Candee Furbish, MD        Signed, Candee Furbish, MD  10/27/2021 3:30 PM    [place]

## 2021-10-27 NOTE — Assessment & Plan Note (Signed)
Simvastatin, no myalgias.  Prior LDL 57

## 2021-10-27 NOTE — Assessment & Plan Note (Addendum)
Continues with Xarelto.  Lab work reviewed.  He has been having quite a few nosebleeds, requires packing to stop at times.  He also has varicose vein which causes significant leg bleeding.  It would be nice for him to be off of the Xarelto.  Thankfully, he is going to be seeing Dr. Quentin Ore.  I will place a consultation in for him to discuss potential watchman device.  This may be quite helpful for him given his bleeding issues.

## 2021-10-27 NOTE — Assessment & Plan Note (Signed)
Pacemaker dependent.  Post AV nodal ablation.

## 2021-10-27 NOTE — Assessment & Plan Note (Signed)
Bleeding.  From left lower extremity.  He is seeing vascular and vein specialist soon.  High pressure, he showed me a picture of blood all over the shower floor.  He states that the stream of blood can hit the wall.  He is on Xarelto.

## 2021-10-27 NOTE — Patient Instructions (Addendum)
Medication Instructions:  Your physician recommends that you continue on your current medications as directed. Please refer to the Current Medication list given to you today.  *If you need a refill on your cardiac medications before your next appointment, please call your pharmacy*   Lab Work: NONE If you have labs (blood work) drawn today and your tests are completely normal, you will receive your results only by: [place] (if you have MyChart) OR A paper copy in the mail If you have any lab test that is abnormal or we need to change your treatment, we will call you to review the results.   Testing/Procedures: Your physician has requested that you see EP Provider.    Follow-Up: At [facility], you and your health needs are our priority.  As part of our continuing mission to provide you with exceptional heart care, we have created designated Provider Care Teams.  These Care Teams include your primary Cardiologist (physician) and Advanced Practice Providers (APPs -  Physician Assistants and Nurse Practitioners) who all work together to provide you with the care you need, when you need it.   Your next appointment:   1 year(s)  The format for your next appointment:   In Person  Provider:   Candee Furbish, MD

## 2021-10-27 NOTE — Assessment & Plan Note (Signed)
Previously NYHA class II with chronic dyspnea for over 15 years.  Continuing with low-dose furosemide.  Low normal EF on most recent echocardiogram.  On carvedilol as well.  CRT therapy.  No changes made.

## 2021-10-27 NOTE — Assessment & Plan Note (Signed)
Permanent atrial fibrillation status post AV nodal ablation.  Pacemaker placement.  He is on Xarelto.  He is pacemaker dependent.

## 2021-11-10 ENCOUNTER — Encounter: Payer: Self-pay | Admitting: Cardiology

## 2021-11-10 ENCOUNTER — Other Ambulatory Visit: Payer: Self-pay

## 2021-11-10 ENCOUNTER — Ambulatory Visit: Payer: PPO | Admitting: Cardiology

## 2021-11-10 VITALS — BP 114/68 | HR 68 | Ht 77.0 in | Wt 245.2 lb

## 2021-11-10 DIAGNOSIS — I428 Other cardiomyopathies: Secondary | ICD-10-CM | POA: Diagnosis not present

## 2021-11-10 DIAGNOSIS — I4821 Permanent atrial fibrillation: Secondary | ICD-10-CM | POA: Diagnosis not present

## 2021-11-10 DIAGNOSIS — T148XXA Other injury of unspecified body region, initial encounter: Secondary | ICD-10-CM | POA: Diagnosis not present

## 2021-11-10 NOTE — Patient Instructions (Addendum)
Medication Instructions:  Your physician recommends that you continue on your current medications as directed. Please refer to the Current Medication list given to you today. *If you need a refill on your cardiac medications before your next appointment, please call your pharmacy*  Lab Work: You got lab work today: If you have labs (blood work) drawn today and your tests are completely normal, you will receive your results only by: [place] (if you have MyChart) OR A paper copy in the mail If you have any lab test that is abnormal or we need to change your treatment, we will call you to review the results.  Testing/Procedures: Your physician has requested that you have cardiac CT. Cardiac computed tomography (CT) is a painless test that uses an x-ray machine to take clear, detailed pictures of your heart.     Follow-Up: At [facility], you and your health needs are our priority.  As part of our continuing mission to provide you with exceptional heart care, we have created designated Provider Care Teams.  These Care Teams include your primary Cardiologist (physician) and Advanced Practice Providers (APPs -  Physician Assistants and Nurse Practitioners) who all work together to provide you with the care you need, when you need it.  Your next appointment:    Lenice Llamas will call you to schedule your procedure

## 2021-11-10 NOTE — Progress Notes (Signed)
Electrophysiology Office Note:    Date:  11/10/2021   ID:  [name], DOB 27-Nov-1945, MRN [id number]  PCP:  Ginger Organ., MD  [facility] Cardiologist:  Candee Furbish, MD  [facility] Electrophysiologist:  Vickie Epley, MD   Referring MD: Jerline Pain, MD   Chief Complaint: Atrial fibrillation  History of Present Illness:    [name] is a 76 y.o. male who presents for an evaluation of atrial fibrillation at the request of Dr. Marlou Porch. Their medical history include permanent atrial fibrillation post pacemaker and AV junction ablation, obstructive sleep apnea, hyperlipidemia.  His CRT pacemaker was implanted in July 2014 with a subsequent generator replacement in March 2022.  He is with his wife today in clinic.  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  The amount of bleeding is actually quite impressive from the lower extremity wounds.  He is interested in a long-term stroke mitigation strategy that avoids long-term exposure anticoagulation.  He is referred to discuss watchman implant.     Past Medical History:  Diagnosis Date   AAA (abdominal aortic aneurysm)    Arthritis    Atrial fibrillation (HCC)    permanent   CAD (coronary artery disease)    Chronic systolic dysfunction of left ventricle    s/p BiV ICD//downgrade to CRT-P (medtronic) on 04-25-2013 by Dr Rayann Heman   Colon polyps    Complete heart block (HCC)    s/p AV nodal ablation for afib   Dysphagia    Elevated LFTs    Fatigue    GERD (gastroesophageal reflux disease)    Gout    Hemochromatosis    Hyperlipidemia    IFG (impaired fasting glucose)    Osteoarthritis    Sleep apnea     Past Surgical History:  Procedure Laterality Date   AV NODE ABLATION     BIV PACEMAKER GENERATOR CHANGE OUT  04-25-2013   downgrade of previously implanted CRTD to Medtronic CRTP by Dr Rayann Heman.  Previously  implanted LV and 4076 RV leads were used   BIV PACEMAKER GENERATOR CHANGEOUT N/A 12/29/2020   Procedure: BIV PACEMAKER GENERATOR CHANGEOUT;  Surgeon: Thompson Grayer, MD;  Location: [place] [facility];  Service: Cardiovascular;  Laterality: N/A;   CARDIAC DEFIBRILLATOR PLACEMENT  2007, 2010   MDT BiV ICD,  [phone] RV lead, though pace/ sense is a Medtronic model 4076 - 58  lead; downgraded to [place] on 04-25-2013 by Dr Rayann Heman   HAND SURGERY     left   HERNIA REPAIR     HYDROCELE EXCISION     IMPLANTABLE CARDIOVERTER DEFIBRILLATOR GENERATOR CHANGE N/A 04/25/2013   Procedure: IMPLANTABLE CARDIOVERTER DEFIBRILLATOR GENERATOR CHANGE;  Surgeon: Thompson Grayer, MD;  Location: [facility] CATH LAB;  Service: Cardiovascular;  Laterality: N/A;   PACEMAKER INSERTION  2006   REPLACEMENT TOTAL KNEE  2000, 2011 x2   bilateral    Current Medications: Current Meds  Medication Sig   carvedilol (COREG) 25 MG tablet Take 12.5 mg by mouth 2 (two) times daily.   clonazePAM (KLONOPIN) 0.5 MG tablet Take 0.5 mg by mouth at bedtime as needed (sleep).   furosemide (LASIX) 20 MG tablet Take 1 tablet (20 mg total) by mouth daily.   Rivaroxaban (XARELTO) 20 MG TABS tablet Take 1 tablet (20 mg total) by mouth daily.   simvastatin (ZOCOR) 80 MG tablet Take 40 mg by mouth in  the morning and at bedtime.   traZODone (DESYREL) 50 MG tablet Take 50 mg by mouth at bedtime as needed for sleep.   Current Facility-Administered Medications for the 11/10/21 encounter (Office Visit) with Vickie Epley, MD  Medication   0.9 %  sodium chloride infusion     Allergies:   Lisinopril and Niacin   Social History   Socioeconomic History   Marital status: Married    Spouse name: Not on file   Number of children: 2   Years of education: Not on file   Highest education level: Not on file  Occupational History   Occupation: sales  Tobacco Use   Smoking status: Former    Packs/day: 1.00    Years: 8.00    Pack years: 8.00    Types:  Cigarettes    Quit date: 10/10/1997    Years since quitting: 24.1   Smokeless tobacco: Never  Vaping Use   Vaping Use: Never used  Substance and Sexual Activity   Alcohol use: Yes    Comment: few times a week   Drug use: No   Sexual activity: Yes  Other Topics Concern   Not on file  Social History Narrative   Not on file   Social Determinants of Health   Financial Resource Strain: Not on file  Food Insecurity: Not on file  Transportation Needs: Not on file  Physical Activity: Not on file  Stress: Not on file  Social Connections: Not on file     Family History: The patient's family history includes Arthritis in his father; Bone cancer in his maternal uncle; Brain cancer in his brother; Breast cancer in his cousin; Emphysema in his father. There is no history of Colon cancer.  ROS:   Please see the history of present illness.    All other systems reviewed and are negative.  EKGs/Labs/Other Studies Reviewed:    The following studies were reviewed today:  July 2022 echo Left ventricular function normal, 50 to 55% Right ventricular function normal Severely dilated left and right atrium Trivial MR Mild to moderate TR  EKG:  The ekg ordered today demonstrates atrial fibrillation, biventricular pacing.  QRS duration 144 ms.   Recent Labs: 04/24/2021: B Natriuretic Peptide 139.3; BUN 18; Creatinine, Ser 1.08; Hemoglobin 13.7; Platelets 188; Potassium 3.6; Sodium 140  Recent Lipid Panel    Component Value Date/Time   CHOL 130 03/06/2019 1404   TRIG 76 03/06/2019 1404   HDL 61 03/06/2019 1404   CHOLHDL 2.1 03/06/2019 1404   CHOLHDL 2 01/08/2015 0843   VLDL 11.0 01/08/2015 0843   LDLCALC 54 03/06/2019 1404    Physical Exam:    VS:  BP 114/68    Pulse 68    Ht 6\' 5"  (1.956 m)    Wt 245 lb 3.2 oz (111.2 kg)    SpO2 98%    BMI 29.08 kg/m     Wt Readings from Last 3 Encounters:  11/10/21 245 lb 3.2 oz (111.2 kg)  10/27/21 241 lb (109.3 kg)  10/07/21 234 lb (106.1 kg)      GEN:  Well nourished, well developed in no acute distress HEENT: Normal NECK: No JVD; No carotid bruits LYMPHATICS: No lymphadenopathy CARDIAC: Regular rhythm.  CRT-P pocket well-healed., no murmurs, rubs, gallops RESPIRATORY:  Clear to auscultation without rales, wheezing or rhonchi  ABDOMEN: Soft, non-tender, non-distended MUSCULOSKELETAL:  No edema; No deformity  SKIN: Warm and dry NEUROLOGIC:  Alert and oriented x 3 PSYCHIATRIC:  Normal affect  ASSESSMENT:    1. Permanent atrial fibrillation ([place])   2. NICM (nonischemic cardiomyopathy) ([place])   3. Bleeding from wound    PLAN:    In order of problems listed above:  #Permanent atrial fibrillation On Xarelto for stroke prophylaxis.  Has had recurrent episodes of nosebleeding and bleeding from lower extremity wounds.  He is interested in a stroke mitigation strategy that avoids long-term exposure anticoagulation which I think is very reasonable.  We discussed the watchman implant in detail during today's visit.  He would like to proceed with scheduling.  ---------------------  I have seen [name] in the office today who is being considered for a Watchman left atrial appendage closure device. I believe they will benefit from this procedure given their history of atrial fibrillation, CHA2DS2-VASc score of 3 and unadjusted ischemic stroke rate of 3.2% per year. Unfortunately, the patient is not felt to be a long term anticoagulation candidate secondary to recurrent epistaxis and vascular bleeding from the legs. The patient's chart has been reviewed and I feel that they would be a candidate for short term oral anticoagulation after Watchman implant.   It is my belief that after undergoing a LAA closure procedure, [name] will not need long term anticoagulation which eliminates anticoagulation side effects and major bleeding risk.   Procedural risks for the Watchman implant have been reviewed with the patient  including a 0.5% risk of stroke, <1% risk of perforation and <1% risk of device embolization.    The published clinical data on the safety and effectiveness of WATCHMAN include but are not limited to the following: - Holmes DR, Mechele Claude, Sick P et al. for the PROTECT AF Investigators. Percutaneous closure of the left atrial appendage versus warfarin therapy for prevention of stroke in patients with atrial fibrillation: a randomised non-inferiority trial. Lancet 2009; 374: 534-42. Mechele Claude, Doshi SK, Abelardo Diesel D et al. on behalf of the PROTECT AF Investigators. Percutaneous Left Atrial Appendage Closure for Stroke Prophylaxis in Patients With Atrial Fibrillation 2.3-Year Follow-up of the PROTECT AF (Watchman Left Atrial Appendage System for Embolic Protection in Patients With Atrial Fibrillation) Trial. Circulation 2013; 127:720-729. - Alli O, Doshi S,  Kar S, Reddy VY, Sievert H et al. Quality of Life Assessment in the Randomized PROTECT AF (Percutaneous Closure of the Left Atrial Appendage Versus Warfarin Therapy for Prevention of Stroke in Patients With Atrial Fibrillation) Trial of Patients at Risk for Stroke With Nonvalvular Atrial Fibrillation. J Am Coll Cardiol 2013; 85:2778-2. Vertell Limber DR, Tarri Abernethy, Price M, [place], Sievert H, Doshi S, Huber K, Reddy V. Prospective randomized evaluation of the Watchman left atrial appendage Device in patients with atrial fibrillation versus long-term warfarin therapy; the PREVAIL trial. Journal of the [employer] of Cardiology, Vol. 4, No. 1, 2014, 1-11. - Kar S, Doshi SK, Sadhu A, Horton R, Osorio J et al. Primary outcome evaluation of a next-generation left atrial appendage closure device: results from the PINNACLE FLX trial. Circulation 2021;143(18)1754-1762.    After today's visit with the patient which was dedicated solely for shared decision making visit regarding LAA closure device, the patient decided to proceed with the LAA appendage  closure procedure scheduled to be done in the near future at [facility]. Prior to the procedure, I would like to obtain a gated CT scan of the chest with contrast timed for PV/LA visualization.   HAS-BLED score 2 Hypertension No  Abnormal renal and liver function (Dialysis, transplant, Cr >  2.26 mg/dL /Cirrhosis or Bilirubin >2x Normal or AST/ALT/AP >3x Normal) No  Stroke No  Bleeding Yes  Labile INR (Unstable/high INR) No  Elderly (>65) Yes  Drugs or alcohol (? 8 drinks/week, anti-plt or NSAID) No   CHA2DS2-VASc Score = 3  The patient's score is based upon: CHF History: 1 HTN History: 0 Diabetes History: 0 Stroke History: 0 Vascular Disease History: 1 Age Score: 1 Gender Score: 0        Total time spent with patient today 60 minutes. This includes reviewing records, evaluating the patient and coordinating care.  Medication Adjustments/Labs and Tests Ordered: Current medicines are reviewed at length with the patient today.  Concerns regarding medicines are outlined above.  Orders Placed This Encounter  Procedures   CT CARDIAC MORPH/PULM VEIN W/CM&W/O CA SCORE   Basic metabolic panel   EKG 22-VVKP   No orders of the defined types were placed in this encounter.    Signed, Hilton Cork. Quentin Ore, MD, [facility] 11/10/2021 9:44 PM    Electrophysiology [facility]

## 2021-11-11 DIAGNOSIS — G4733 Obstructive sleep apnea (adult) (pediatric): Secondary | ICD-10-CM | POA: Diagnosis not present

## 2021-11-11 LAB — BASIC METABOLIC PANEL
BUN/Creatinine Ratio: 14 (ref 10–24)
BUN: 13 mg/dL (ref 8–27)
CO2: 24 mmol/L (ref 20–29)
Calcium: 9.5 mg/dL (ref 8.6–10.2)
Chloride: 106 mmol/L (ref 96–106)
Creatinine, Ser: 0.95 mg/dL (ref 0.76–1.27)
Glucose: 73 mg/dL (ref 70–99)
Potassium: 4.3 mmol/L (ref 3.5–5.2)
Sodium: 144 mmol/L (ref 134–144)
eGFR: 84 mL/min/{1.73_m2} (ref >59)

## 2021-11-16 ENCOUNTER — Other Ambulatory Visit: Payer: Self-pay

## 2021-11-16 DIAGNOSIS — I83899 Varicose veins of unspecified lower extremities with other complications: Secondary | ICD-10-CM

## 2021-11-17 ENCOUNTER — Ambulatory Visit: Payer: PPO | Admitting: Physician Assistant

## 2021-11-17 ENCOUNTER — Other Ambulatory Visit: Payer: Self-pay

## 2021-11-17 ENCOUNTER — Ambulatory Visit (HOSPITAL_COMMUNITY)
Admission: RE | Admit: 2021-11-17 | Discharge: 2021-11-17 | Disposition: A | Discharge status: Home / Self Care | Payer: PPO | Source: Ambulatory Visit | Attending: Vascular Surgery | Admitting: Vascular Surgery

## 2021-11-17 VITALS — BP 119/72 | HR 68 | Temp 97.9°F | Resp 20 | Ht 77.0 in | Wt 236.0 lb

## 2021-11-17 DIAGNOSIS — G5600 Carpal tunnel syndrome, unspecified upper limb: Secondary | ICD-10-CM | POA: Insufficient documentation

## 2021-11-17 DIAGNOSIS — Z461 Encounter for fitting and adjustment of hearing aid: Secondary | ICD-10-CM | POA: Insufficient documentation

## 2021-11-17 DIAGNOSIS — Z5181 Encounter for therapeutic drug level monitoring: Secondary | ICD-10-CM | POA: Insufficient documentation

## 2021-11-17 DIAGNOSIS — I83892 Varicose veins of left lower extremities with other complications: Secondary | ICD-10-CM

## 2021-11-17 DIAGNOSIS — H903 Sensorineural hearing loss, bilateral: Secondary | ICD-10-CM | POA: Insufficient documentation

## 2021-11-17 DIAGNOSIS — M1990 Unspecified osteoarthritis, unspecified site: Secondary | ICD-10-CM | POA: Insufficient documentation

## 2021-11-17 DIAGNOSIS — I83899 Varicose veins of unspecified lower extremities with other complications: Secondary | ICD-10-CM

## 2021-11-17 DIAGNOSIS — M7989 Other specified soft tissue disorders: Secondary | ICD-10-CM

## 2021-11-17 DIAGNOSIS — E785 Hyperlipidemia, unspecified: Secondary | ICD-10-CM | POA: Insufficient documentation

## 2021-11-17 DIAGNOSIS — F4310 Post-traumatic stress disorder, unspecified: Secondary | ICD-10-CM | POA: Insufficient documentation

## 2021-11-17 DIAGNOSIS — I872 Venous insufficiency (chronic) (peripheral): Secondary | ICD-10-CM | POA: Diagnosis not present

## 2021-11-17 DIAGNOSIS — Z0271 Encounter for disability determination: Secondary | ICD-10-CM | POA: Insufficient documentation

## 2021-11-17 DIAGNOSIS — Z96659 Presence of unspecified artificial knee joint: Secondary | ICD-10-CM | POA: Insufficient documentation

## 2021-11-17 DIAGNOSIS — I2510 Atherosclerotic heart disease of native coronary artery without angina pectoris: Secondary | ICD-10-CM | POA: Insufficient documentation

## 2021-11-18 ENCOUNTER — Encounter: Payer: Self-pay | Admitting: Physician Assistant

## 2021-11-18 NOTE — Progress Notes (Signed)
Office Note     CC:  follow up Requesting Provider:  Ginger Organ., MD  HPI: [name] is a 76 y.o. (05-03-1946) male who presents for evaluation of episodic bleeding from superficial veins of left lateral ankle.  Patient states every 3 weeks for the past several months he has had spontaneous bleeding in the shower from surface veins of his left lateral ankle.  He does not wear compression.  He does not elevate his legs during the day.  He denies any history of DVT, venous ulcerations, trauma, or prior vascular intervention.  He is a former smoker.  He works as a Administrator.  He is on Xarelto for atrial fibrillation.   Past Medical History:  Diagnosis Date   AAA (abdominal aortic aneurysm)    Arthritis    Atrial fibrillation (HCC)    permanent   CAD (coronary artery disease)    Chronic systolic dysfunction of left ventricle    s/p BiV ICD//downgrade to CRT-P (medtronic) on 04-25-2013 by Dr Rayann Heman   Colon polyps    Complete heart block (HCC)    s/p AV nodal ablation for afib   Dysphagia    Elevated LFTs    Fatigue    GERD (gastroesophageal reflux disease)    Gout    Hemochromatosis    Hyperlipidemia    IFG (impaired fasting glucose)    Osteoarthritis    Sleep apnea     Past Surgical History:  Procedure Laterality Date   AV NODE ABLATION     BIV PACEMAKER GENERATOR CHANGE OUT  04-25-2013   downgrade of previously implanted CRTD to Medtronic CRTP by Dr Rayann Heman.  Previously implanted LV and 4076 RV leads were used   BIV PACEMAKER GENERATOR CHANGEOUT N/A 12/29/2020   Procedure: BIV PACEMAKER GENERATOR CHANGEOUT;  Surgeon: Thompson Grayer, MD;  Location: [place] [facility];  Service: Cardiovascular;  Laterality: N/A;   CARDIAC DEFIBRILLATOR PLACEMENT  2007, 2010   MDT BiV ICD,  [id number] RV lead, though pace/ sense is a Medtronic model 4076 - 58  lead; downgraded to [place] on 04-25-2013 by Dr Rayann Heman   HAND SURGERY     left   HERNIA REPAIR     HYDROCELE EXCISION      IMPLANTABLE CARDIOVERTER DEFIBRILLATOR GENERATOR CHANGE N/A 04/25/2013   Procedure: IMPLANTABLE CARDIOVERTER DEFIBRILLATOR GENERATOR CHANGE;  Surgeon: Thompson Grayer, MD;  Location: [facility] CATH LAB;  Service: Cardiovascular;  Laterality: N/A;   PACEMAKER INSERTION  2006   REPLACEMENT TOTAL KNEE  2000, 2011 x2   bilateral    Social History   Socioeconomic History   Marital status: Married    Spouse name: Not on file   Number of children: 2   Years of education: Not on file   Highest education level: Not on file  Occupational History   Occupation: sales  Tobacco Use   Smoking status: Former    Packs/day: 1.00    Years: 8.00    Pack years: 8.00    Types: Cigarettes    Quit date: 10/10/1997    Years since quitting: 24.1   Smokeless tobacco: Never  Vaping Use   Vaping Use: Never used  Substance and Sexual Activity   Alcohol use: Yes    Comment: few times a week   Drug use: No   Sexual activity: Yes  Other Topics Concern   Not on file  Social History Narrative   Not on file   Social Determinants of Radio broadcast assistant  Strain: Not on file  Food Insecurity: Not on file  Transportation Needs: Not on file  Physical Activity: Not on file  Stress: Not on file  Social Connections: Not on file  Intimate Partner Violence: Not on file    Family History  Problem Relation Age of Onset   Emphysema Father        smoker   Arthritis Father    Bone cancer Maternal Uncle    Brain cancer Brother        step-brother   Breast cancer Cousin        maternal cousin   Colon cancer Neg Hx     Current Outpatient Medications  Medication Sig Dispense Refill   carvedilol (COREG) 25 MG tablet Take 12.5 mg by mouth 2 (two) times daily.     clonazePAM (KLONOPIN) 0.5 MG tablet Take 0.5 mg by mouth at bedtime as needed (sleep).     furosemide (LASIX) 20 MG tablet Take 1 tablet (20 mg total) by mouth daily. 90 tablet 3   Rivaroxaban (XARELTO) 20 MG TABS tablet Take 1 tablet (20 mg total) by  mouth daily. 30 tablet 3   simvastatin (ZOCOR) 80 MG tablet Take 40 mg by mouth in the morning and at bedtime.     traZODone (DESYREL) 50 MG tablet Take 50 mg by mouth at bedtime as needed for sleep.     Current Facility-Administered Medications  Medication Dose Route Frequency Provider Last Rate Last Admin   0.9 %  sodium chloride infusion  500 mL Intravenous Continuous Irene Shipper, MD        Allergies  Allergen Reactions   Lisinopril     Hot flashes   Niacin     Hot flashes     REVIEW OF SYSTEMS:   [X]  denotes positive finding, [ ]  denotes negative finding Cardiac  Comments:  Chest pain or chest pressure:    Shortness of breath upon exertion:    Short of breath when lying flat:    Irregular heart rhythm:        Vascular    Pain in calf, thigh, or hip brought on by ambulation:    Pain in feet at night that wakes you up from your sleep:     Blood clot in your veins:    Leg swelling:         Pulmonary    Oxygen at home:    Productive cough:     Wheezing:         Neurologic    Sudden weakness in arms or legs:     Sudden numbness in arms or legs:     Sudden onset of difficulty speaking or slurred speech:    Temporary loss of vision in one eye:     Problems with dizziness:         Gastrointestinal    Blood in stool:     Vomited blood:         Genitourinary    Burning when urinating:     Blood in urine:        Psychiatric    Major depression:         Hematologic    Bleeding problems:    Problems with blood clotting too easily:        Skin    Rashes or ulcers:        Constitutional    Fever or chills:      PHYSICAL EXAMINATION:  Vitals:   11/17/21 1432  BP: 119/72  Pulse: 68  Resp: 20  Temp: 97.9 F (36.6 C)  TempSrc: Temporal  SpO2: 96%  Weight: 236 lb (107 kg)  Height: 6\' 5"  (1.956 m)    General:  WDWN in NAD; vital signs documented above Gait: Not observed HENT: WNL, normocephalic Pulmonary: normal non-labored breathing , without  Rales, rhonchi,  wheezing Cardiac: regular HR Abdomen: soft, NT, no masses Skin: without rashes Vascular Exam/Pulses:  Right Left  Radial 2+ (normal) 2+ (normal)  DP 2+ (normal) 2+ (normal)   Extremities: without ischemic changes, without Gangrene , without cellulitis; without open wounds; small eschar over left lateral ankle surface veins in area of prior spontaneous bleed; no ulceration; stasis pigmentation changes bilateral lower legs Musculoskeletal: no muscle wasting or atrophy  Neurologic: A&O X 3;  No focal weakness or paresthesias are detected Psychiatric:  The pt has Normal affect.   Non-Invasive Vascular Imaging:   Left lower extremity reflux study negative for DVT Deep reflux noted in the common femoral vein Segment of left greater saphenous vein incompetent    ASSESSMENT/PLAN:: 76 y.o. male here for evaluation of periodic bleeding from surface veins of left lateral ankle  -Venous insufficiency noted on left lower extremity reflux study with deep venous reflux in the common femoral vein and a portion of the greater saphenous vein around the level of the knee with reflux.  However with limited reflux in the greater saphenous vein not involving the saphenofemoral junction he would not be a candidate for laser ablation.  There is not a wound surrounding the surface veins.  Recommendations would include knee-high 15 to 20 mmHg compression socks to be worn daily.  He should also elevate his legs above the level of his heart periodically.  He will also avoid prolonged sitting and standing.  If surface veins of left lateral ankle were to develop a larger wound he would likely need Unna boot changes until completely healed.  Patient otherwise can follow-up on an as-needed basis.   Dagoberto Ligas, PA-C [facility] [phone]  Clinic MD:   Donzetta Matters

## 2021-11-24 ENCOUNTER — Telehealth (HOSPITAL_COMMUNITY): Payer: Self-pay | Admitting: *Deleted

## 2021-11-24 NOTE — Telephone Encounter (Signed)
Reaching out to patient to offer assistance regarding upcoming cardiac imaging study; pt verbalizes understanding of appt date/time, parking situation and where to check in, pre-test NPO status and verified current allergies; name and call back number provided for further questions should they arise  Thomas Clement RN Navigator [facility] [place] [facility] [phone] office [phone] cell  Patient aware to arrive at 3:30pm for his 4pm scan.

## 2021-11-25 ENCOUNTER — Other Ambulatory Visit: Payer: Self-pay

## 2021-11-25 ENCOUNTER — Encounter (HOSPITAL_COMMUNITY): Payer: Self-pay

## 2021-11-25 ENCOUNTER — Ambulatory Visit (HOSPITAL_COMMUNITY)
Admission: RE | Admit: 2021-11-25 | Discharge: 2021-11-25 | Disposition: A | Discharge status: Home / Self Care | Payer: PPO | Source: Ambulatory Visit | Attending: Cardiology | Admitting: Cardiology

## 2021-11-25 DIAGNOSIS — I428 Other cardiomyopathies: Secondary | ICD-10-CM | POA: Diagnosis not present

## 2021-11-25 DIAGNOSIS — I4821 Permanent atrial fibrillation: Secondary | ICD-10-CM | POA: Insufficient documentation

## 2021-11-25 MED ORDER — IOHEXOL 350 MG/ML SOLN
95.0000 mL | Freq: Once | INTRAVENOUS | Status: AC | PRN
  Administered 2021-11-25: 95 mL via INTRAVENOUS

## 2021-11-26 ENCOUNTER — Other Ambulatory Visit: Payer: Self-pay

## 2021-11-26 ENCOUNTER — Telehealth: Payer: Self-pay

## 2021-11-26 DIAGNOSIS — I513 Intracardiac thrombosis, not elsewhere classified: Secondary | ICD-10-CM

## 2021-11-26 MED ORDER — ROSUVASTATIN CALCIUM 20 MG PO TABS: 20.0000 mg | ORAL_TABLET | Freq: Every day | ORAL | 3 refills | Status: DC

## 2021-11-26 MED ORDER — DABIGATRAN ETEXILATE MESYLATE 150 MG PO CAPS: 150.0000 mg | ORAL_CAPSULE | Freq: Two times a day (BID) | ORAL | 3 refills | Status: DC

## 2021-11-26 NOTE — Telephone Encounter (Signed)
-----   Message from Vickie Epley, MD sent at 11/26/2021  1:42 PM EST ----- Regarding: RE: LA thrombus Hey need to stop his Xarelto and change to pradaxa.  He needs a TEE in 4-6 weeks. Watchman on indefinite hold.  Thanks,  Lars Mage        ----- Message ----- From: Tommie Raymond, NP Sent: 11/26/2021   9:38 AM EST To: Theodoro Parma, RN, Vickie Epley, MD Subject: LA thrombus                                    Hey Dr. Quentin Ore, this patient had pre Watchman scans yesterday that unfortunately showed LA thrombus. Please let us know how to proceed?   Thanks  JIll

## 2021-11-26 NOTE — Telephone Encounter (Signed)
Agree, thank you

## 2021-11-26 NOTE — Telephone Encounter (Signed)
Kathyrn Drown spoke with the patient. Instructed him to STOP XARELTO and START PRADAXA 150 mg BID. Will also STOP SIMVASTATIN and START CRESTOR 20 mg daily.    Follow up with Kathyrn Drown scheduled 12/20/2021. TEE scheduled 01/04/2022 with Dr. Gasper Sells. Booking ID: [id number]

## 2021-12-01 ENCOUNTER — Other Ambulatory Visit: Payer: Self-pay | Admitting: *Deleted

## 2021-12-01 ENCOUNTER — Telehealth: Payer: Self-pay

## 2021-12-01 MED ORDER — DABIGATRAN ETEXILATE MESYLATE 150 MG PO CAPS: 150.0000 mg | ORAL_CAPSULE | Freq: Two times a day (BID) | ORAL | 3 refills | Status: DC

## 2021-12-01 MED ORDER — ROSUVASTATIN CALCIUM 20 MG PO TABS: 20.0000 mg | ORAL_TABLET | Freq: Every day | ORAL | 3 refills | Status: DC

## 2021-12-01 MED ORDER — DABIGATRAN ETEXILATE MESYLATE 150 MG PO CAPS: 150.0000 mg | ORAL_CAPSULE | Freq: Two times a day (BID) | ORAL | 1 refills | Status: DC

## 2021-12-01 NOTE — Telephone Encounter (Signed)
Spoke with the patient. Since his [place] doctor did not prescribe him Pradaxa or Crestor, they will not prescribe through the [place].  Called in both medications to [facility].   Informed the patient [facility] does not have samples of Pradaxa.   He will call back with questions or concerns.

## 2021-12-01 NOTE — Telephone Encounter (Signed)
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 Key: [id number]

## 2021-12-02 NOTE — Telephone Encounter (Signed)
**Note De-Identified Dawaun Brancato Obfuscation** Burna Sis Key: [id number] - PA Case ID: [id number] Outcome: Approved on February 22 22-FEB-23:31-DEC-23 Pradaxa 150MG  OR CAPS Quantity:180 Drug: Pradaxa 150MG  capsules Form: [place] [employer] Electronic Prior Authorization Form 2017 NCPDP  I have notified [place] of this approval. Per the pharmacist the RX will be ready for pick up after 10 am this morning.

## 2021-12-09 DIAGNOSIS — G4733 Obstructive sleep apnea (adult) (pediatric): Secondary | ICD-10-CM | POA: Diagnosis not present

## 2021-12-15 NOTE — Progress Notes (Signed)
[facility]                                     Cardiology Office Note:    Date:  12/20/2021   ID:  [name], DOB 1946-02-22, MRN [id number]  PCP:  Ginger Organ., MD  [facility] Cardiologist:  Candee Furbish, MD  [facility] Electrophysiologist:  Vickie Epley, MD   Referring MD: Ginger Organ., MD   Chief Complaint  Patient presents with   Follow-up    Pre TEE visit    History of Present Illness:    [name] is a 76 y.o. male with a hx of permanent atrial fibrillation on [facility] with Xarelto, s/p pacemaker and AV junction ablation, obstructive sleep apnea, hyperlipidemia, obstructive sleep apnea, chronic systolic heart failure with normalization of EF, and AAA.   He was referred to Dr. Quentin Ore 11/10/21 for the evaluation of atrial fibrillation and possible Watchman implant. His CRT pacemaker was implanted in 04/2013 with a subsequent generator replacement in 12/2020. At the time of Dr. Claudie Revering evaluation, he was experiencing recurrent episodes of significant nose bleeding. Given this, he was interested in a long-term stroke mitigation strategy that avoids long-term exposure anticoagulation.  He is referred to discuss watchman implant.   Pre Watchman CT imaging performed 11/25/21 which showed the left atrial appendage with apparent thrombus. Per Dr. Quentin Ore, the patient was transitioned from Xarelto to Pradaxa. Plan is to repeat TEE scheduled for 01/04/22 at 930am.    He is here today and reports that he has been doing well. We discussed TEE at length and all questions answered. He denies chest pain, palpitations, orthopnea, LE edema, dizziness, or syncope.   Past Medical History:  Diagnosis Date   AAA (abdominal aortic aneurysm)    Arthritis    Atrial fibrillation (HCC)    permanent   CAD (coronary artery disease)    Chronic systolic dysfunction of left ventricle    s/p BiV ICD//downgrade to CRT-P (medtronic) on 04-25-2013 by Dr Rayann Heman   Colon  polyps    Complete heart block (HCC)    s/p AV nodal ablation for afib   Dysphagia    Elevated LFTs    Fatigue    GERD (gastroesophageal reflux disease)    Gout    Hemochromatosis    Hyperlipidemia    IFG (impaired fasting glucose)    Osteoarthritis    Sleep apnea     Past Surgical History:  Procedure Laterality Date   AV NODE ABLATION     BIV PACEMAKER GENERATOR CHANGE OUT  04-25-2013   downgrade of previously implanted CRTD to Medtronic CRTP by Dr Rayann Heman.  Previously implanted LV and 4076 RV leads were used   BIV PACEMAKER GENERATOR CHANGEOUT N/A 12/29/2020   Procedure: BIV PACEMAKER GENERATOR CHANGEOUT;  Surgeon: Thompson Grayer, MD;  Location: [place] [facility];  Service: Cardiovascular;  Laterality: N/A;   CARDIAC DEFIBRILLATOR PLACEMENT  2007, 2010   MDT BiV ICD,  [phone] RV lead, though pace/ sense is a Medtronic model 4076 - 58  lead; downgraded to [place] on 04-25-2013 by Dr Rayann Heman   HAND SURGERY     left   [place] N/A 04/25/2013   Procedure: IMPLANTABLE CARDIOVERTER DEFIBRILLATOR GENERATOR CHANGE;  Surgeon: Thompson Grayer, MD;  Location: [facility] CATH  LAB;  Service: Cardiovascular;  Laterality: N/A;   PACEMAKER INSERTION  2006   REPLACEMENT TOTAL KNEE  2000, 2011 x2   bilateral    Current Medications: Current Meds  Medication Sig   carvedilol (COREG) 25 MG tablet Take 12.5 mg by mouth 2 (two) times daily.   clonazePAM (KLONOPIN) 0.5 MG tablet Take 0.5 mg by mouth at bedtime as needed (sleep).   dabigatran (PRADAXA) 150 MG CAPS capsule Take 1 capsule (150 mg total) by mouth 2 (two) times daily.   furosemide (LASIX) 20 MG tablet Take 1 tablet (20 mg total) by mouth daily.   rosuvastatin (CRESTOR) 20 MG tablet Take 1 tablet (20 mg total) by mouth daily.   traZODone (DESYREL) 50 MG tablet Take 50 mg by mouth at bedtime as needed for sleep.   Current Facility-Administered Medications for the  12/20/21 encounter (Office Visit) with [facility]  Medication   0.9 %  sodium chloride infusion     Allergies:   Lisinopril and Niacin   Social History   Socioeconomic History   Marital status: Married    Spouse name: Not on file   Number of children: 2   Years of education: Not on file   Highest education level: Not on file  Occupational History   Occupation: sales  Tobacco Use   Smoking status: Former    Packs/day: 1.00    Years: 8.00    Pack years: 8.00    Types: Cigarettes    Quit date: 10/10/1997    Years since quitting: 24.2   Smokeless tobacco: Never  Vaping Use   Vaping Use: Never used  Substance and Sexual Activity   Alcohol use: Yes    Comment: few times a week   Drug use: No   Sexual activity: Yes  Other Topics Concern   Not on file  Social History Narrative   Not on file   Social Determinants of Health   Financial Resource Strain: Not on file  Food Insecurity: Not on file  Transportation Needs: Not on file  Physical Activity: Not on file  Stress: Not on file  Social Connections: Not on file     Family History: The patient's family history includes Arthritis in his father; Bone cancer in his maternal uncle; Brain cancer in his brother; Breast cancer in his cousin; Emphysema in his father. There is no history of Colon cancer.  ROS:   Please see the history of present illness.    All other systems reviewed and are negative.  EKGs/Labs/Other Studies Reviewed:    EKG:  EKG is ordered today.  The ekg ordered today demonstrates AF with V pacing, HR 69bpm  Recent Labs: 04/24/2021: B Natriuretic Peptide 139.3; Hemoglobin 13.7; Platelets 188 11/10/2021: BUN 13; Creatinine, Ser 0.95; Potassium 4.3; Sodium 144   Recent Lipid Panel    Component Value Date/Time   CHOL 130 03/06/2019 1404   TRIG 76 03/06/2019 1404   HDL 61 03/06/2019 1404   CHOLHDL 2.1 03/06/2019 1404   CHOLHDL 2 01/08/2015 0843   VLDL 11.0 01/08/2015 0843   LDLCALC  54 03/06/2019 1404   Risk Assessment/Calculations:    CHA2DS2-VASc Score = 3 [ CHF History: 1, HTN History: 0, Diabetes History: 0, Stroke History: 0, Vascular Disease History: 1, Age Score: 1, Gender Score: 0].  Therefore, the patient's annual risk of stroke is 3.2 %.      HAS-BLED score: 3   12/20/21 1145  HAS-BLED Score  Hypertension (Uncontrolled, SBP > 160  mmHg) 0  Abnormal Renal Function  0  Abnormal Liver Function  0  Stroke History 0  Prior major bleeding or predisposition to bleeding 1  Labile INR 0  Age > 65 1  Medication usage predisposing to bleeding 1  Alcohol use 0  HAS-BLED Score 3  Yearly Major Bleeding Risk % 3.74   Physical Exam:    VS:  BP 120/74    Pulse 69    Ht '6\' 5"'$  (1.956 m)    Wt 242 lb 12.8 oz (110.1 kg)    SpO2 99%    BMI 28.79 kg/m     Wt Readings from Last 3 Encounters:  12/20/21 242 lb 12.8 oz (110.1 kg)  11/17/21 236 lb (107 kg)  11/10/21 245 lb 3.2 oz (111.2 kg)    General: Well developed, well nourished, NAD Lungs:Clear to ausculation bilaterally. No wheezes, rales, or rhonchi. Breathing is unlabored. Cardiovascular: Irregularly irregular. No murmurs Extremities: No edema.  Neuro: Alert and oriented. No focal deficits. No facial asymmetry. MAE spontaneously. Psych: Responds to questions appropriately with normal affect.    ASSESSMENT/PLAN:    Atrial appendage thrombus: Found on pre Watchman CT. He was transitioned to Pradaxa with plans for follow up TEE 01/04/22. After careful review of history and examination, the risks and benefits of transesophageal echocardiogram have been explained including risks of esophageal damage, perforation (1:10,000 risk), bleeding, pharyngeal hematoma as well as other potential complications associated with conscious sedation including aspiration, arrhythmia, respiratory failure and death. Alternatives to treatment were discussed, questions were answered. Patient is willing to proceed. Obtain BMET, CBC today as  pre-procedure labs  PAF: Permanent atrial fibrillation status post AV nodal ablation. Pacemaker placement. Continue current regimen   NICM: Last echocardiogram 05/03/21 with LVEF at 50-55%. Appears euvolemic today with no changes needed. CRT therapy.   Hx of AV block with PPM placement: Gen change out 12/2020. V paced on EKG today.   Medication Adjustments/Labs and Tests Ordered: Current medicines are reviewed at length with the patient today.  Concerns regarding medicines are outlined above.  Orders Placed This Encounter  Procedures   Basic metabolic panel   CBC   EKG 12-Lead   No orders of the defined types were placed in this encounter.   Patient Instructions  Medication Instructions:  Your physician recommends that you continue on your current medications as directed. Please refer to the Current Medication list given to you today.  *If you need a refill on your cardiac medications before your next appointment, please call your pharmacy*   Lab Work: TODAY: BMET, CBC If you have labs (blood work) drawn today and your tests are completely normal, you will receive your results only by: [place] (if you have MyChart) OR A paper copy in the mail If you have any lab test that is abnormal or we need to change your treatment, we will call you to review the results.   Testing/Procedures:  You are scheduled for a TEE/Cardioversion/TEE Cardioversion on 01/04/22 with Dr. Radford Pax.  Please arrive at the [facility] (Main Entrance A) at [facility]: [address] [place], [place] [address] at 8:30 am. (1 hour prior to procedure unless lab work is needed; if lab work is needed arrive 1.5 hours ahead)  DIET: Nothing to eat or drink after midnight except a sip of water with medications (see medication instructions below)  FYI: For your safety, and to allow [place] to monitor your vital signs accurately during the surgery/procedure we request that  if you have artificial nails,  gel coating, SNS etc. Please have those removed prior to your surgery/procedure. Not having the nail coverings /polish removed may result in cancellation or delay of your surgery/procedure.   Medication Instructions:  Continue your anticoagulant: PRADAXA You will need to continue your anticoagulant after your procedure until you  are told by your  Provider that it is safe to stop   You must have a responsible person to drive you home and stay in the waiting area during your procedure. Failure to do so could result in cancellation.  Bring your insurance cards.  *Special Note: Every effort is made to have your procedure done on time. Occasionally there are emergencies that occur at the hospital that may cause delays. Please be patient if a delay does occur.     Follow-Up: At [facility], you and your health needs are our priority.  As part of our continuing mission to provide you with exceptional heart care, we have created designated Provider Care Teams.  These Care Teams include your primary Cardiologist (physician) and Advanced Practice Providers (APPs -  Physician Assistants and Nurse Practitioners) who all work together to provide you with the care you need, when you need it.  We recommend signing up for the patient portal called "MyChart".  Sign up information is provided on this After Visit Summary.  MyChart is used to connect with patients for Virtual Visits (Telemedicine).  Patients are able to view lab/test results, encounter notes, upcoming appointments, etc.  Non-urgent messages can be sent to your provider as well.   To learn more about what you can do with MyChart, go to [email or web address].    Your next appointment:   SOMEONE FROM THE STRUCTURAL TEAM WILL CALL YOU TO SCHEDULE A FOLLOW UP APPOINTMENT      Signed, Kathyrn Drown, NP  12/20/2021 11:47 AM    [place]

## 2021-12-20 ENCOUNTER — Other Ambulatory Visit: Payer: Self-pay

## 2021-12-20 ENCOUNTER — Ambulatory Visit: Payer: PPO | Admitting: Cardiology

## 2021-12-20 VITALS — BP 120/74 | HR 69 | Ht 77.0 in | Wt 242.8 lb

## 2021-12-20 DIAGNOSIS — I4811 Longstanding persistent atrial fibrillation: Secondary | ICD-10-CM | POA: Diagnosis not present

## 2021-12-20 DIAGNOSIS — D6869 Other thrombophilia: Secondary | ICD-10-CM | POA: Diagnosis not present

## 2021-12-20 DIAGNOSIS — I4821 Permanent atrial fibrillation: Secondary | ICD-10-CM | POA: Diagnosis not present

## 2021-12-20 DIAGNOSIS — I428 Other cardiomyopathies: Secondary | ICD-10-CM

## 2021-12-20 DIAGNOSIS — I4891 Unspecified atrial fibrillation: Secondary | ICD-10-CM

## 2021-12-20 DIAGNOSIS — I442 Atrioventricular block, complete: Secondary | ICD-10-CM | POA: Diagnosis not present

## 2021-12-20 DIAGNOSIS — I83899 Varicose veins of unspecified lower extremities with other complications: Secondary | ICD-10-CM | POA: Diagnosis not present

## 2021-12-20 LAB — BASIC METABOLIC PANEL
BUN/Creatinine Ratio: 15 (ref 10–24)
BUN: 14 mg/dL (ref 8–27)
CO2: 26 mmol/L (ref 20–29)
Calcium: 9.2 mg/dL (ref 8.6–10.2)
Chloride: 103 mmol/L (ref 96–106)
Creatinine, Ser: 0.94 mg/dL (ref 0.76–1.27)
Glucose: 93 mg/dL (ref 70–99)
Potassium: 4.3 mmol/L (ref 3.5–5.2)
Sodium: 140 mmol/L (ref 134–144)
eGFR: 85 mL/min/{1.73_m2} (ref >59)

## 2021-12-20 LAB — CBC
Hematocrit: 38.9 % (ref 37.5–51.0)
Hemoglobin: 13.4 g/dL (ref 13.0–17.7)
MCH: 33.7 pg — ABNORMAL HIGH (ref 26.6–33.0)
MCHC: 34.4 g/dL (ref 31.5–35.7)
MCV: 98 fL — ABNORMAL HIGH (ref 79–97)
Platelets: 183 10*3/uL (ref 150–450)
RBC: 3.98 x10E6/uL — ABNORMAL LOW (ref 4.14–5.80)
RDW: 12 % (ref 11.6–15.4)
WBC: 5.3 10*3/uL (ref 3.4–10.8)

## 2021-12-20 NOTE — Patient Instructions (Signed)
Medication Instructions:  ?Your physician recommends that you continue on your current medications as directed. Please refer to the Current Medication list given to you today.  ?*If you need a refill on your cardiac medications before your next appointment, please call your pharmacy* ? ? ?Lab Work: ?TODAY: BMET, CBC ?If you have labs (blood work) drawn today and your tests are completely normal, you will receive your results only by: ?MyChart Message (if you have MyChart) OR ?A paper copy in the mail ?If you have any lab test that is abnormal or we need to change your treatment, we will call you to review the results. ? ? ?Testing/Procedures: ? ?You are scheduled for a TEE/Cardioversion/TEE Cardioversion on 01/04/22 with Dr. Radford Pax.  Please arrive at the [facility] (Main Entrance A) at [facility]: [address] [place], [place] [address] at 8:30 am. (1 hour prior to procedure unless lab work is needed; if lab work is needed arrive 1.5 hours ahead) ? ?DIET: Nothing to eat or drink after midnight except a sip of water with medications (see medication instructions below) ? ?FYI: For your safety, and to allow [place] to monitor your vital signs accurately during the surgery/procedure we request that   ?if you have artificial nails, gel coating, SNS etc. Please have those removed prior to your surgery/procedure. Not having the nail coverings /polish removed may result in cancellation or delay of your surgery/procedure. ? ? ?Medication Instructions: ? ?Continue your anticoagulant: PRADAXA ?You will need to continue your anticoagulant after your procedure until you  are told by your  ?Provider that it is safe to stop ? ? ?You must have a responsible person to drive you home and stay in the waiting area during your procedure. Failure to do so could result in cancellation. ? ?Interior and spatial designer cards. ? ?*Special Note: Every effort is made to have your procedure done on time. Occasionally there are emergencies  that occur at the hospital that may cause delays. Please be patient if a delay does occur.   ? ? ?Follow-Up: ?At [facility], you and your health needs are our priority.  As part of our continuing mission to provide you with exceptional heart care, we have created designated Provider Care Teams.  These Care Teams include your primary Cardiologist (physician) and Advanced Practice Providers (APPs -  Physician Assistants and Nurse Practitioners) who all work together to provide you with the care you need, when you need it. ? ?We recommend signing up for the patient portal called "MyChart".  Sign up information is provided on this After Visit Summary.  MyChart is used to connect with patients for Virtual Visits (Telemedicine).  Patients are able to view lab/test results, encounter notes, upcoming appointments, etc.  Non-urgent messages can be sent to your provider as well.   ?To learn more about what you can do with MyChart, go to [email or web address].   ? ?Your next appointment:   ?SOMEONE FROM THE STRUCTURAL TEAM WILL CALL YOU TO SCHEDULE A FOLLOW UP APPOINTMENT   ? ?

## 2021-12-20 NOTE — H&P (View-Only) (Signed)
?   12/20/21 1145  ?HAS-BLED Score  ?Hypertension (Uncontrolled, SBP > 160 mmHg) 0  ?Abnormal Renal Function  0  ?Abnormal Liver Function  0  ?Stroke History 0  ?Prior major bleeding or predisposition to bleeding 1  ?Labile INR 0  ?Age > 65 1  ?Medication usage predisposing to bleeding 1  ?Alcohol use 0  ?HAS-BLED Score 3  ?Yearly Major Bleeding Risk % 3.74  ? ? ?

## 2021-12-20 NOTE — Progress Notes (Signed)
?   12/20/21 1145  ?HAS-BLED Score  ?Hypertension (Uncontrolled, SBP > 160 mmHg) 0  ?Abnormal Renal Function  0  ?Abnormal Liver Function  0  ?Stroke History 0  ?Prior major bleeding or predisposition to bleeding 1  ?Labile INR 0  ?Age > 65 1  ?Medication usage predisposing to bleeding 1  ?Alcohol use 0  ?HAS-BLED Score 3  ?Yearly Major Bleeding Risk % 3.74  ? ? ?

## 2021-12-22 DIAGNOSIS — M79641 Pain in right hand: Secondary | ICD-10-CM | POA: Diagnosis not present

## 2021-12-22 DIAGNOSIS — M85841 Other specified disorders of bone density and structure, right hand: Secondary | ICD-10-CM | POA: Diagnosis not present

## 2021-12-22 DIAGNOSIS — M1811 Unilateral primary osteoarthritis of first carpometacarpal joint, right hand: Secondary | ICD-10-CM | POA: Diagnosis not present

## 2021-12-27 ENCOUNTER — Encounter (HOSPITAL_COMMUNITY): Payer: Self-pay | Admitting: Internal Medicine

## 2021-12-27 NOTE — Progress Notes (Signed)
Attempted to obtain medical history via telephone, unable to reach at this time. I left a voicemail to return pre surgical testing department's phone call.  

## 2021-12-28 ENCOUNTER — Ambulatory Visit (INDEPENDENT_AMBULATORY_CARE_PROVIDER_SITE_OTHER): Payer: PPO

## 2021-12-28 DIAGNOSIS — I428 Other cardiomyopathies: Secondary | ICD-10-CM | POA: Diagnosis not present

## 2021-12-28 LAB — CUP PACEART REMOTE DEVICE CHECK
Battery Remaining Longevity: 137 mo
Battery Voltage: 3.06 V
Brady Statistic AP VP Percent: 0 %
Brady Statistic AP VS Percent: 0 %
Brady Statistic AS VP Percent: 99.97 %
Brady Statistic AS VS Percent: 0.03 %
Brady Statistic RA Percent Paced: 0 %
Brady Statistic RV Percent Paced: 99.97 %
Date Time Interrogation Session: 20230320222842
Implantable Lead Implant Date: 20050105
Implantable Lead Implant Date: 20061130
Implantable Lead Implant Date: 20061130
Implantable Lead Location: 753858
Implantable Lead Location: 753859
Implantable Lead Location: 753860
Implantable Lead Model: 4076
Implantable Lead Model: 4194
Implantable Lead Model: 5076
Implantable Pulse Generator Implant Date: 20220322
Lead Channel Impedance Value: 323 Ohm
Lead Channel Impedance Value: 399 Ohm
Lead Channel Impedance Value: 437 Ohm
Lead Channel Impedance Value: 513 Ohm
Lead Channel Impedance Value: 551 Ohm
Lead Channel Impedance Value: 570 Ohm
Lead Channel Impedance Value: 608 Ohm
Lead Channel Impedance Value: 722 Ohm
Lead Channel Impedance Value: 817 Ohm
Lead Channel Pacing Threshold Amplitude: 0.375 V
Lead Channel Pacing Threshold Pulse Width: 0.4 ms
Lead Channel Sensing Intrinsic Amplitude: 0.75 mV
Lead Channel Setting Pacing Amplitude: 1.25 V
Lead Channel Setting Pacing Amplitude: 2 V
Lead Channel Setting Pacing Pulse Width: 0.4 ms
Lead Channel Setting Pacing Pulse Width: 0.4 ms
Lead Channel Setting Sensing Sensitivity: 5.6 mV

## 2022-01-04 ENCOUNTER — Ambulatory Visit (HOSPITAL_BASED_OUTPATIENT_CLINIC_OR_DEPARTMENT_OTHER): Payer: PPO | Admitting: Certified Registered"

## 2022-01-04 ENCOUNTER — Ambulatory Visit (HOSPITAL_BASED_OUTPATIENT_CLINIC_OR_DEPARTMENT_OTHER)
Admission: RE | Admit: 2022-01-04 | Discharge: 2022-01-04 | Disposition: A | Discharge status: Home / Self Care | Payer: PPO | Source: Ambulatory Visit | Attending: Cardiology | Admitting: Cardiology

## 2022-01-04 ENCOUNTER — Encounter (HOSPITAL_COMMUNITY)
Admission: RE | Disposition: A | Discharge status: Home / Self Care | Payer: PPO | Source: Home / Self Care | Attending: Internal Medicine

## 2022-01-04 ENCOUNTER — Ambulatory Visit (HOSPITAL_COMMUNITY)
Admission: RE | Admit: 2022-01-04 | Discharge: 2022-01-04 | Disposition: A | Discharge status: Home / Self Care | Payer: PPO | Attending: Internal Medicine | Admitting: Internal Medicine

## 2022-01-04 ENCOUNTER — Other Ambulatory Visit: Payer: Self-pay

## 2022-01-04 ENCOUNTER — Encounter (HOSPITAL_COMMUNITY): Payer: Self-pay | Admitting: Internal Medicine

## 2022-01-04 ENCOUNTER — Ambulatory Visit (HOSPITAL_COMMUNITY): Payer: PPO | Admitting: Certified Registered"

## 2022-01-04 DIAGNOSIS — G4730 Sleep apnea, unspecified: Secondary | ICD-10-CM | POA: Diagnosis not present

## 2022-01-04 DIAGNOSIS — I2510 Atherosclerotic heart disease of native coronary artery without angina pectoris: Secondary | ICD-10-CM | POA: Diagnosis not present

## 2022-01-04 DIAGNOSIS — Z87891 Personal history of nicotine dependence: Secondary | ICD-10-CM | POA: Diagnosis not present

## 2022-01-04 DIAGNOSIS — I513 Intracardiac thrombosis, not elsewhere classified: Secondary | ICD-10-CM

## 2022-01-04 DIAGNOSIS — R0602 Shortness of breath: Secondary | ICD-10-CM | POA: Insufficient documentation

## 2022-01-04 DIAGNOSIS — I083 Combined rheumatic disorders of mitral, aortic and tricuspid valves: Secondary | ICD-10-CM | POA: Diagnosis not present

## 2022-01-04 DIAGNOSIS — I4891 Unspecified atrial fibrillation: Secondary | ICD-10-CM | POA: Diagnosis not present

## 2022-01-04 DIAGNOSIS — I080 Rheumatic disorders of both mitral and aortic valves: Secondary | ICD-10-CM | POA: Insufficient documentation

## 2022-01-04 DIAGNOSIS — R1310 Dysphagia, unspecified: Secondary | ICD-10-CM | POA: Diagnosis not present

## 2022-01-04 DIAGNOSIS — I10 Essential (primary) hypertension: Secondary | ICD-10-CM

## 2022-01-04 DIAGNOSIS — I700 Atherosclerosis of aorta: Secondary | ICD-10-CM | POA: Insufficient documentation

## 2022-01-04 DIAGNOSIS — I340 Nonrheumatic mitral (valve) insufficiency: Secondary | ICD-10-CM

## 2022-01-04 DIAGNOSIS — F419 Anxiety disorder, unspecified: Secondary | ICD-10-CM | POA: Diagnosis not present

## 2022-01-04 HISTORY — PX: TEE WITHOUT CARDIOVERSION: SHX5443

## 2022-01-04 SURGERY — ECHOCARDIOGRAM, TRANSESOPHAGEAL
Anesthesia: Monitor Anesthesia Care

## 2022-01-04 MED ORDER — LACTATED RINGERS IV SOLN
INTRAVENOUS | Status: AC | PRN
Start: 2022-01-04 — End: 2022-01-04
  Administered 2022-01-04: 1000 mL via INTRAVENOUS

## 2022-01-04 MED ORDER — LIDOCAINE 2% (20 MG/ML) 5 ML SYRINGE
INTRAMUSCULAR | Status: DC | PRN
Start: 2022-01-04 — End: 2022-01-04
  Administered 2022-01-04: 100 mg via INTRAVENOUS

## 2022-01-04 MED ORDER — PROPOFOL 500 MG/50ML IV EMUL
INTRAVENOUS | Status: DC | PRN
  Administered 2022-01-04: 125 ug/kg/min via INTRAVENOUS

## 2022-01-04 MED ORDER — PROPOFOL 10 MG/ML IV BOLUS
INTRAVENOUS | Status: DC | PRN
  Administered 2022-01-04: 90 mg via INTRAVENOUS

## 2022-01-04 MED ORDER — SODIUM CHLORIDE 0.9 % IV SOLN: INTRAVENOUS | Status: DC

## 2022-01-04 NOTE — Anesthesia Preprocedure Evaluation (Addendum)
Anesthesia Evaluation  ?Patient identified by MRN, date of birth, ID band ?Patient awake ? ? ? ?Reviewed: ?Allergy & Precautions, NPO status , Patient's Chart, lab work & pertinent test results ? ?Airway ?Mallampati: II ? ?TM Distance: >3 FB ?Neck ROM: Full ? ? ? Dental ? ?(+) Dental Advisory Given, Teeth Intact, Chipped, Poor Dentition ?  ?Pulmonary ?shortness of breath, sleep apnea , former smoker,  ?  ?Pulmonary exam normal ?breath sounds clear to auscultation ? ? ? ? ? ? Cardiovascular ?hypertension, Pt. on home beta blockers and Pt. on medications ?+ CAD  ?+ dysrhythmias  ?Rhythm:Regular Rate:Normal ? ?Echo 04/2021 ?1. Left ventricular ejection fraction, by estimation, is 50 to 55%. The left ventricle has low normal function. The left ventricle has no regional wall motion abnormalities. There is mild left ventricular hypertrophy. Left ventricular diastolic parameters are indeterminate.  ??2. Right ventricular systolic function is normal. The right ventricular size is mildly enlarged. There is moderately elevated pulmonary artery systolic pressure. The estimated right ventricular systolic pressure is  ?08.8 mmHg.  ??3. Left atrial size was severely dilated.  ??4. Right atrial size was severely dilated.  ??5. The mitral valve is normal in structure. Trivial mitral valve regurgitation. No evidence of mitral stenosis.  ??6. Tricuspid valve regurgitation is mild to moderate.  ??7. The aortic valve is normal in structure. Aortic valve regurgitation is trivial. No aortic stenosis is present.  ??8. The inferior vena cava is dilated in size with <50% respiratory variability, suggesting right atrial pressure of 15 mmHg.  ? ?AICD interrogation 12/2021 ?99.97 % AS-VS ?  ?Neuro/Psych ?PSYCHIATRIC DISORDERS Anxiety  Neuromuscular disease   ? GI/Hepatic ?Neg liver ROS, GERD  ,  ?Endo/Other  ?negative endocrine ROS ? Renal/GU ?negative Renal ROS  ? ?  ?Musculoskeletal ? ?(+) Arthritis ,  ?  Abdominal ?  ?Peds ? Hematology ?negative hematology ROS ?(+)   ?Anesthesia Other Findings ? ? Reproductive/Obstetrics ? ?  ? ? ? ? ? ? ? ? ? ? ? ? ? ?  ?  ? ? ? ? ? ? ?Anesthesia Physical ?Anesthesia Plan ? ?ASA: 3 ? ?Anesthesia Plan: MAC  ? ?Post-op Pain Management: Minimal or no pain anticipated  ? ?Induction: Intravenous ? ?PONV Risk Score and Plan: 1 and Propofol infusion, TIVA and Treatment may vary due to age or medical condition ? ?Airway Management Planned: Natural Airway ? ?Additional Equipment:  ? ?Intra-op Plan:  ? ?Post-operative Plan:  ? ?Informed Consent: I have reviewed the patients History and Physical, chart, labs and discussed the procedure including the risks, benefits and alternatives for the proposed anesthesia with the patient or authorized representative who has indicated his/her understanding and acceptance.  ? ? ? ?Dental advisory given ? ?Plan Discussed with: CRNA ? ?Anesthesia Plan Comments:   ? ? ? ? ? ?Anesthesia Quick Evaluation ? ?

## 2022-01-04 NOTE — Progress Notes (Signed)
?  Echocardiogram ?Echocardiogram Transesophageal has been performed. ? ?[name] ?01/04/2022, 9:50 AM ?

## 2022-01-04 NOTE — Interval H&P Note (Signed)
History and Physical Interval Note: ? ?01/04/2022 ?8:50 AM ? ?[name]  has presented today for surgery, with the diagnosis of LEFT ATRIAL THROMBIS.  The various methods of treatment have been discussed with the patient and family. After consideration of risks, benefits and other options for treatment, the patient has consented to  Procedure(s): ?TRANSESOPHAGEAL ECHOCARDIOGRAM (TEE) (N/A) as a surgical intervention.  The patient's history has been reviewed, patient examined, no change in status, stable for surgery.  I have reviewed the patient's chart and labs.  Questions were answered to the patient's satisfaction.   ? ?History of dysphagia without syndrome esophageal pathology noted; we discussed risks related to this. ? ?[name] A Corrinne Benegas ? ? ?

## 2022-01-04 NOTE — CV Procedure (Signed)
? ? ?  TRANSESOPHAGEAL ECHOCARDIOGRAM  ? ?NAME:  [name]    ?MRN: [id number] ?DOB:  09/08/1946    ?ADMIT DATE: 01/04/2022 ? ?INDICATIONS: ?Left atrial appendage thrombus ? ?PROCEDURE:  ? ?Informed consent was obtained prior to the procedure. The risks, benefits and alternatives for the procedure were discussed and the patient comprehended these risks.  Risks include, but are not limited to, cough, sore throat, vomiting, nausea, somnolence, esophageal and stomach trauma or perforation, bleeding, low blood pressure, aspiration, pneumonia, infection, trauma to the teeth and death.   ? ?Procedural time out performed. The oropharynx was anesthetized with topical 1% benzocaine.   ? ?Anesthesia was administered by Dr. Lissa Hoard and team.  The patient was administered a total of Propofol 300 mgto achieve and maintain moderate to deep conscious sedation.  The patient's heart rate, blood pressure, and oxygen saturation are monitored continuously during the procedure. The period of conscious sedation is 16 minutes, of which I was present face-to-face 100% of this time.  ? ?The transesophageal probe was inserted in the esophagus and stomach without difficulty and multiple views were obtained.  ? ?COMPLICATIONS:   ? ?There were no immediate complications. ? ?KEY FINDINGS: ? ?There is evidence of left atrial appendage thrombus ?Full report to follow. ?Further management per primary team.  ? ?Rudean Haskell, MD ?[place]  ?9:30 AM ? ? ?

## 2022-01-04 NOTE — Transfer of Care (Signed)
Immediate Anesthesia Transfer of Care Note ? ?Patient: [name] ? ?Procedure(s) Performed: TRANSESOPHAGEAL ECHOCARDIOGRAM (TEE) ? ?Patient Location: PACU ? ?Anesthesia Type:General ? ?Level of Consciousness: drowsy and patient cooperative ? ?Airway & Oxygen Therapy: Patient Spontanous Breathing ? ?Post-op Assessment: Report given to RN and Post -op Vital signs reviewed and stable ? ?Post vital signs: Reviewed and stable ? ?Last Vitals:  ?Vitals Value Taken Time  ?BP    ?Temp    ?Pulse    ?Resp    ?SpO2    ? ? ?Last Pain:  ?Vitals:  ? 01/04/22 0834  ?TempSrc: Temporal  ?PainSc: 0-No pain  ?   ? ?  ? ?Complications: No notable events documented. ?

## 2022-01-05 ENCOUNTER — Encounter (HOSPITAL_COMMUNITY): Payer: Self-pay | Admitting: Internal Medicine

## 2022-01-05 NOTE — Anesthesia Postprocedure Evaluation (Signed)
Anesthesia Post Note ? ?Patient: [name] ? ?Procedure(s) Performed: TRANSESOPHAGEAL ECHOCARDIOGRAM (TEE) ? ?  ? ?Patient location during evaluation: PACU ?Anesthesia Type: MAC ?Level of consciousness: awake and alert ?Pain management: pain level controlled ?Vital Signs Assessment: post-procedure vital signs reviewed and stable ?Respiratory status: spontaneous breathing ?Cardiovascular status: stable ?Anesthetic complications: no ? ? ?No notable events documented. ? ?Last Vitals:  ?Vitals:  ? 01/04/22 0930 01/04/22 0943  ?BP: 110/68 127/76  ?Pulse:  60  ?Resp: 16 (!) 21  ?Temp: 36.6 ?C   ?SpO2: 99% 96%  ?  ?Last Pain:  ?Vitals:  ? 01/04/22 0930  ?TempSrc:   ?PainSc: 0-No pain  ? ? ?  ?  ?  ?  ?  ?  ? ?Nolon Nations ? ? ? ? ?

## 2022-01-06 ENCOUNTER — Telehealth: Payer: Self-pay

## 2022-01-06 NOTE — Telephone Encounter (Signed)
Per Dr. Quentin Ore, discussed TEE results with patient. He understands he has a persistent LAA clot and Dr. Quentin Ore recommends changing to Coumadin.  ? ?Spoke with Pharmacist who gave appointment time tomorrow for the patient to come in and discuss transitioning since he will need Lovenox bridging.  ? ?Called to discuss with patient. He was adamantly against going on Coumadin. He said he was on it for years over a decade ago and does not want to do it again. Attempted to schedule him in [facility] tomorrow but he will be out of town. He requested that I speak with Dr. Quentin Ore for alternative therapies.   ? ?Will discuss with Dr. Quentin Ore and call the patient tomorrow. ?

## 2022-01-07 NOTE — Telephone Encounter (Signed)
Per Dr. Quentin Ore: ?"As an alternative, can stay on Pradaxa and repeat in 3 months.  ?You can add him on to my schedule sometime in the next week or so and I can chat with him more about it." ? ?Scheduled the patient for office visit with Dr. Quentin Ore 4/7 to discuss treatment options for LAA clot.  ?Scheduled the patient with the PharmD immediately after  Dr. Mardene Speak visit in case it is decided to transition to Coumadin. PharmD visit will need to be cancelled if not needed. ? ?The patient was grateful for call and agrees with plan.  ?

## 2022-01-09 DIAGNOSIS — G4733 Obstructive sleep apnea (adult) (pediatric): Secondary | ICD-10-CM | POA: Diagnosis not present

## 2022-01-10 DIAGNOSIS — R52 Pain, unspecified: Secondary | ICD-10-CM | POA: Diagnosis not present

## 2022-01-10 DIAGNOSIS — M1811 Unilateral primary osteoarthritis of first carpometacarpal joint, right hand: Secondary | ICD-10-CM | POA: Diagnosis not present

## 2022-01-11 NOTE — Progress Notes (Signed)
Remote pacemaker transmission.   

## 2022-01-12 ENCOUNTER — Ambulatory Visit: Payer: PPO | Admitting: Internal Medicine

## 2022-01-12 ENCOUNTER — Encounter: Payer: Self-pay | Admitting: Internal Medicine

## 2022-01-12 VITALS — BP 118/76 | HR 61 | Ht 77.0 in | Wt 236.1 lb

## 2022-01-12 DIAGNOSIS — Z7901 Long term (current) use of anticoagulants: Secondary | ICD-10-CM

## 2022-01-12 DIAGNOSIS — Z86010 Personal history of colonic polyps: Secondary | ICD-10-CM

## 2022-01-12 DIAGNOSIS — K222 Esophageal obstruction: Secondary | ICD-10-CM

## 2022-01-12 DIAGNOSIS — R1310 Dysphagia, unspecified: Secondary | ICD-10-CM | POA: Diagnosis not present

## 2022-01-12 DIAGNOSIS — K219 Gastro-esophageal reflux disease without esophagitis: Secondary | ICD-10-CM

## 2022-01-12 MED ORDER — PANTOPRAZOLE SODIUM 40 MG PO TBEC
40.0000 mg | DELAYED_RELEASE_TABLET | Freq: Every day | ORAL | 3 refills | Status: DC
Start: 2022-01-12 — End: 2022-04-04

## 2022-01-12 MED ORDER — NA SULFATE-K SULFATE-MG SULF 17.5-3.13-1.6 GM/177ML PO SOLN: 1.0000 | Freq: Once | ORAL | 0 refills | Status: AC

## 2022-01-12 NOTE — Patient Instructions (Addendum)
If you are age 75 or older, your body mass index should be between 23-30. Your Body mass index is 28 kg/m?Marland Kitchen If this is out of the aforementioned range listed, please consider follow up with your Primary Care Provider. ? ?If you are age 44 or younger, your body mass index should be between 19-25. Your Body mass index is 28 kg/m?Marland Kitchen If this is out of the aformentioned range listed, please consider follow up with your Primary Care Provider.  ? ?________________________________________________________ ? ?The  GI providers would like to encourage you to use [facility] to communicate with providers for non-urgent requests or questions.  Due to long hold times on the telephone, sending your provider a message by [facility] may be a faster and more efficient way to get a response.  Please allow 48 business hours for a response.  Please remember that this is for non-urgent requests.  ?_______________________________________________________ ? ?We have sent the following medications to your pharmacy for you to pick up at your convenience:  Pantoprazole ? ?You have been scheduled for an endoscopy and colonoscopy. Please follow the written instructions given to you at your visit today. ?Please pick up your prep supplies at the pharmacy within the next 1-3 days. ?If you use inhalers (even only as needed), please bring them with you on the day of your procedure. ? ?

## 2022-01-12 NOTE — Progress Notes (Signed)
HISTORY OF PRESENT ILLNESS: ? ?[name] is a 76 y.o. male with multiple significant medical and cardiac problems as outlined below.  Currently on chronic Pradaxa.  Current ejection fraction 50 to 55%.  He does have implantable defibrillator and pacemaker.  There is a history of atrial fibrillation and sleep apnea.  Patient has a history of adenomatous colon polyps.  He also has a history of GERD with symptomatic esophageal stricture requiring dilation.  He presents today with chief complaint of severe worsening intermittent solid food dysphagia over the past year.  He describes transient food impactions that require self-induced vomiting for relief.  He is not on PPI.  His last upper endoscopy with esophageal dilation was performed July 2017.  Dilated to 30 French at that time.  Was prescribed PPI.  Had been on Xarelto which was interrupted short-term.  He also underwent concurrent colonoscopy at that time for history of adenomatous colon polyps.  He was found to have a diminutive adenoma.  Follow-up in 5 years recommended.  The patient denies lower GI complaints.  He states that he is working with his cardiologist and is anticipating a change to Coumadin.  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  Review of blood work from December 20, 2021 shows a normal basic metabolic panel.  Unremarkable CBC with hemoglobin 13.4.  Recent EKG showed A-fib with pacer activity ? ?REVIEW OF SYSTEMS: ? ?All non-GI ROS negative unless otherwise stated in the HPI except for arthritis, hearing problems, nosebleeds ? ?Past Medical History:  ?Diagnosis Date  ? AAA (abdominal aortic aneurysm) ([place])   ? Arthritis   ? Atrial fibrillation ([place])   ? permanent  ? CAD (coronary artery disease)   ? Chronic systolic dysfunction of left ventricle   ? s/p BiV ICD//downgrade to CRT-P (medtronic) on 04-25-2013 by Dr Rayann Heman  ? Colon polyps   ? Complete heart block ([place])   ? s/p AV nodal ablation for afib  ? Dysphagia   ? Elevated LFTs   ?  Fatigue   ? GERD (gastroesophageal reflux disease)   ? Gout   ? Hemochromatosis   ? Hyperlipidemia   ? IFG (impaired fasting glucose)   ? Osteoarthritis   ? Sleep apnea   ? ? ?Past Surgical History:  ?Procedure Laterality Date  ? AV NODE ABLATION    ? BIV PACEMAKER GENERATOR CHANGE OUT  04-25-2013  ? downgrade of previously implanted CRTD to Medtronic CRTP by Dr Rayann Heman.  Previously implanted LV and 4076 RV leads were used  ? [place] N/A 12/29/2020  ? Procedure: BIV PACEMAKER GENERATOR CHANGEOUT;  Surgeon: Thompson Grayer, MD;  Location: [place] CV LAB;  Service: Cardiovascular;  Laterality: N/A;  ? CARDIAC DEFIBRILLATOR PLACEMENT  2007, 2010  ? MDT BiV ICD,  4193 RV lead, though pace/ sense is a Medtronic model 4076 - 58  lead; downgraded to CRTP on 04-25-2013 by Dr Rayann Heman  ? HAND SURGERY    ? left  ? HERNIA REPAIR    ? HYDROCELE EXCISION    ? IMPLANTABLE CARDIOVERTER DEFIBRILLATOR GENERATOR CHANGE N/A 04/25/2013  ? Procedure: IMPLANTABLE CARDIOVERTER DEFIBRILLATOR GENERATOR CHANGE;  Surgeon: Thompson Grayer, MD;  Location: [facility] CATH LAB;  Service: Cardiovascular;  Laterality: N/A;  ? PACEMAKER INSERTION  2006  ? REPLACEMENT TOTAL KNEE  2000, 2011 x2  ? bilateral  ? TEE WITHOUT CARDIOVERSION N/A 01/04/2022  ? Procedure: TRANSESOPHAGEAL ECHOCARDIOGRAM (TEE);  Surgeon: Werner Lean, MD;  Location: [place] ENDOSCOPY;  Service: Cardiovascular;  Laterality: N/A;  ? ? ?Social History ?DENZAL MEIR  reports that he quit smoking about 24 years ago. His smoking use included cigarettes. He has a 8.00 pack-year smoking history. He has never used smokeless tobacco. He reports current alcohol use. He reports that he does not use drugs. ? ?family history includes Arthritis in his father; Bone cancer in his maternal uncle; Brain cancer in his brother; Breast cancer in his cousin; Emphysema in his father. ? ?Allergies  ?Allergen Reactions  ? Lisinopril   ?  Hot flashes  ? Niacin   ?  Hot flashes  ? ? ?   ? ?PHYSICAL EXAMINATION: ?Vital signs: BP 118/76   Pulse 61   Ht '6\' 5"'$  (1.956 m)   Wt 236 lb 2 oz (107.1 kg)   BMI 28.00 kg/m?   ?Constitutional: generally well-appearing, no acute distress ?Psychiatric: alert and oriented x3, cooperative ?Eyes: extraocular movements intact, anicteric, conjunctiva pink ?Mouth: oral pharynx moist, no lesions ?Neck: supple no lymphadenopathy ?Cardiovascular: Regular, no murmur ?Lungs: clear to auscultation bilaterally ?Abdomen: soft, nontender, nondistended, no obvious ascites, no peritoneal signs, normal bowel sounds, no organomegaly ?Rectal: Deferred to colonoscopy ?Extremities: no clubbing cyanosis or edema lower extremity edema bilaterally ?Skin: no lesions on visible extremities ?Neuro: No focal deficits.  Cranial nerves intact ? ?ASSESSMENT: ? ?1.  Chronic GERD.  On the medical therapy. ?2.  Worsening intermittent solid food dysphagia.  Likely due to recurrent peptic stricture ?3.  History of adenomatous colon polyps.  Due for surveillance ?4.  Multiple significant medical problems.  On Pradaxa ? ? ?PLAN: ? ?1.  Reflux precautions.  Chew food extremely well ?2.  Prescribe pantoprazole 40 mg daily ?3.  Schedule upper endoscopy with esophageal dilation.  The patient is HIGH RISK given his comorbidities and the need to address chronic anticoagulation.The nature of the procedure, as well as the risks, benefits, and alternatives were carefully and thoroughly reviewed with the patient. Ample time for discussion and questions allowed. The patient understood, was satisfied, and agreed to proceed.  ?4.  Schedule colonoscopy for colorectal neoplasia surveillance.  The patient is high risk as above.The nature of the procedure, as well as the risks, benefits, and alternatives were carefully and thoroughly reviewed with the patient. Ample time for discussion and questions allowed. The patient understood, was satisfied, and agreed to proceed.  ?5.  We will discuss with the patient's  cardiology team prospects of short-term interruption of his anticoagulation for his therapeutic endoscopic procedures. ? ? ? ? ?  ?

## 2022-01-14 ENCOUNTER — Ambulatory Visit (INDEPENDENT_AMBULATORY_CARE_PROVIDER_SITE_OTHER): Payer: PPO | Admitting: Pharmacist

## 2022-01-14 ENCOUNTER — Ambulatory Visit: Payer: PPO | Admitting: Cardiology

## 2022-01-14 ENCOUNTER — Encounter: Payer: Self-pay | Admitting: Cardiology

## 2022-01-14 VITALS — BP 132/86 | HR 64 | Ht 77.0 in | Wt 235.0 lb

## 2022-01-14 DIAGNOSIS — I442 Atrioventricular block, complete: Secondary | ICD-10-CM | POA: Diagnosis not present

## 2022-01-14 DIAGNOSIS — I513 Intracardiac thrombosis, not elsewhere classified: Secondary | ICD-10-CM | POA: Diagnosis not present

## 2022-01-14 DIAGNOSIS — I4821 Permanent atrial fibrillation: Secondary | ICD-10-CM

## 2022-01-14 DIAGNOSIS — Z7901 Long term (current) use of anticoagulants: Secondary | ICD-10-CM

## 2022-01-14 MED ORDER — WARFARIN SODIUM 5 MG PO TABS: ORAL_TABLET | ORAL | 0 refills | Status: DC

## 2022-01-14 MED ORDER — ENOXAPARIN SODIUM 150 MG/ML IJ SOSY: 150.0000 mg | PREFILLED_SYRINGE | INTRAMUSCULAR | 0 refills | Status: DC

## 2022-01-14 NOTE — Progress Notes (Signed)
?Electrophysiology Office Follow up Visit Note:   ? ?Date:  01/14/2022  ? ?ID:  [name], DOB 1945/12/18, MRN [id number] ? ?PCP:  Ginger Organ., MD  ?[place] Cardiologist:  Candee Furbish, MD  ?[facility] Electrophysiologist:  Vickie Epley, MD  ? ? ?Interval History:   ? ?[name] is a 76 y.o. male who presents for a follow up visit. They were last seen in clinic 11/10/2021. ?Since their last appointment, they most recently followed up with Thomas Drown, NP on 12/20/2021 where he was doing well. His EKG at that visit showed atrial fibrillation with V pacing, heart rate 69 bpm. His repeat TEE performed 01/04/2022 showed LAA thrombus. ? ?He is accompanied by his wife. Overall, he appears well. We reviewed his TEE 01/04/2022 at length. ? ?He denies any palpitations, chest pain, shortness of breath, or peripheral edema. No lightheadedness, headaches, syncope, orthopnea, or PND. ? ? ?  ? ?Past Medical History:  ?Diagnosis Date  ? AAA (abdominal aortic aneurysm) ([place])   ? Arthritis   ? Atrial fibrillation ([place])   ? permanent  ? CAD (coronary artery disease)   ? Chronic systolic dysfunction of left ventricle   ? s/p BiV ICD//downgrade to CRT-P (medtronic) on 04-25-2013 by Dr Rayann Heman  ? Colon polyps   ? Complete heart block ([place])   ? s/p AV nodal ablation for afib  ? Dysphagia   ? Elevated LFTs   ? Fatigue   ? GERD (gastroesophageal reflux disease)   ? Gout   ? Hemochromatosis   ? Hyperlipidemia   ? IFG (impaired fasting glucose)   ? Osteoarthritis   ? Sleep apnea   ? ? ?Past Surgical History:  ?Procedure Laterality Date  ? AV NODE ABLATION    ? BIV PACEMAKER GENERATOR CHANGE OUT  04-25-2013  ? downgrade of previously implanted CRTD to Medtronic CRTP by Dr Rayann Heman.  Previously implanted LV and 4076 RV leads were used  ? [place] N/A 12/29/2020  ? Procedure: BIV PACEMAKER GENERATOR CHANGEOUT;  Surgeon: Thompson Grayer, MD;  Location: [place] CV LAB;  Service: Cardiovascular;   Laterality: N/A;  ? CARDIAC DEFIBRILLATOR PLACEMENT  2007, 2010  ? MDT BiV ICD,  9767 RV lead, though pace/ sense is a Medtronic model 4076 - 58  lead; downgraded to CRTP on 04-25-2013 by Dr Rayann Heman  ? HAND SURGERY    ? left  ? HERNIA REPAIR    ? HYDROCELE EXCISION    ? IMPLANTABLE CARDIOVERTER DEFIBRILLATOR GENERATOR CHANGE N/A 04/25/2013  ? Procedure: IMPLANTABLE CARDIOVERTER DEFIBRILLATOR GENERATOR CHANGE;  Surgeon: Thompson Grayer, MD;  Location: [facility] CATH LAB;  Service: Cardiovascular;  Laterality: N/A;  ? PACEMAKER INSERTION  2006  ? REPLACEMENT TOTAL KNEE  2000, 2011 x2  ? bilateral  ? TEE WITHOUT CARDIOVERSION N/A 01/04/2022  ? Procedure: TRANSESOPHAGEAL ECHOCARDIOGRAM (TEE);  Surgeon: Werner Lean, MD;  Location: [facility] ENDOSCOPY;  Service: Cardiovascular;  Laterality: N/A;  ? ? ?Current Medications: ?Current Meds  ?Medication Sig  ? acetaminophen (TYLENOL) 500 MG tablet Take 500-1,000 mg by mouth every 6 (six) hours as needed for moderate pain.  ? carvedilol (COREG) 25 MG tablet Take 12.5 mg by mouth 2 (two) times daily.  ? clonazePAM (KLONOPIN) 0.5 MG tablet Take 0.5 mg by mouth at bedtime as needed (sleep).  ? colchicine 0.6 MG tablet Take 0.6 mg by mouth daily as needed (gout flare).  ? Cyanocobalamin (B-12 PO) Take 1 capsule by mouth daily.  ?  furosemide (LASIX) 20 MG tablet Take 1 tablet (20 mg total) by mouth daily.  ? Multiple Vitamins-Minerals (ZINC PO) Take 1 tablet by mouth 3 (three) times a week.  ? pantoprazole (PROTONIX) 40 MG tablet Take 1 tablet (40 mg total) by mouth daily.  ? rosuvastatin (CRESTOR) 20 MG tablet Take 1 tablet (20 mg total) by mouth daily.  ? SIMPLY SALINE NA Place 1 spray into the nose as needed.  ? traZODone (DESYREL) 50 MG tablet Take 50 mg by mouth at bedtime as needed for sleep.  ? VITAMIN D PO Take 1 capsule by mouth daily.  ? [DISCONTINUED] dabigatran (PRADAXA) 150 MG CAPS capsule Take 1 capsule (150 mg total) by mouth 2 (two) times daily.  ? ?Current  Facility-Administered Medications for the 01/14/22 encounter (Office Visit) with Vickie Epley, MD  ?Medication  ? 0.9 %  sodium chloride infusion  ?  ? ?Allergies:   Lisinopril and Niacin  ? ?Social History  ? ?Socioeconomic History  ? Marital status: Married  ?  Spouse name: Not on file  ? Number of children: 2  ? Years of education: Not on file  ? Highest education level: Not on file  ?Occupational History  ? Occupation: Press photographer  ?Tobacco Use  ? Smoking status: Former  ?  Packs/day: 1.00  ?  Years: 8.00  ?  Pack years: 8.00  ?  Types: Cigarettes  ?  Quit date: 10/10/1997  ?  Years since quitting: 24.2  ? Smokeless tobacco: Never  ?Vaping Use  ? Vaping Use: Never used  ?Substance and Sexual Activity  ? Alcohol use: Yes  ?  Comment: few times a week  ? Drug use: No  ? Sexual activity: Yes  ?Other Topics Concern  ? Not on file  ?Social History Narrative  ? Not on file  ? ?Social Determinants of Health  ? ?Financial Resource Strain: Not on file  ?Food Insecurity: Not on file  ?Transportation Needs: Not on file  ?Physical Activity: Not on file  ?Stress: Not on file  ?Social Connections: Not on file  ?  ? ?Family History: ?The patient's family history includes Arthritis in his father; Bone cancer in his maternal uncle; Brain cancer in his brother; Breast cancer in his cousin; Emphysema in his father. There is no history of Colon cancer. ? ?ROS:   ?Please see the history of present illness.    ?All other systems reviewed and are negative. ? ?EKGs/Labs/Other Studies Reviewed:   ? ?The following studies were reviewed today: ? ?Echo TEE 01/04/2022: ? 1. Left atrial size was severely dilated. A left atrial/left atrial  ?appendage thrombus was detected- there is a linear echodensity that  ?attaches lateral to the superior portion of the left atrial appendage  ?base. There is heavy smoke, the stalk  ?attachment is conceing for thrombus. Maximal projections of left atrial  ?dimensions 30 mm X 31 mm; a 35 mm Watchman FLX may be  used if LAAO was  ?planned.  ? 2. Left ventricular ejection fraction, by estimation, is 50 to 55%. The  ?left ventricle has low normal function. The left ventricle has no regional  ?wall motion abnormalities.  ? 3. Right ventricular systolic function is normal. The right ventricular  ?size is normal.  ? 4. Right atrial size was severely dilated.  ? 5. 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 Mild mitral valve regurgitation. No evidence of mitral  ?stenosis.  ? 6. The aortic valve is  tricuspid. Aortic valve regurgitation is mild. No  ?aortic stenosis is present.  ? 7. There is mild (Grade II) plaque involving the descending aorta.  ? ?Cardiac CTA 11/25/2021: ?FINDINGS: ?A 120 kV prospective scan was triggered in the descending thoracic ?aorta at 111 HU's. Gantry rotation speed was 280 msecs and ?collimation was .9 mm. No beta blockade and no NTG was given. The 3D ?data set was reconstructed in 5% intervals of the 60-80 % of the R-R ?cycle. Diastolic phases were analyzed on a dedicated work station ?using MPR, MIP and VRT modes. The patient received 95 cc of ?contrast. ?  ?Pulmonary Veins: ?  ?There is normal variant pulmonary vein drainage into the left atrium ?(3 on the right and 2 on the left) with ostial measurements as ?follows: ?  ?RUPV: 32 mm x 20 mm ?  ?RLPV: 24 mm x 21 mm ?  ?RMPV: 13 mm x 10 mm ?  ?LUPV: 25 mm x 19 mm ?  ?LLPV: 23 mm x 18 mm ?  ?Left atrium and appendage: ?  ?Left atrium: Severely dilated left atrium. There is a perfusion ?defect in the tip of the left atrial appendage that dose not ?completely clear with delay; HU Units 46. This is concerning for ?left atrial thrombus. ?  ?Left atrial appendage: The left atrial appendage is windsock ?morphology. ?  ?Pre-procedural Measurements ?  ?Landing Zone Measurements: ?  ?Left atrial appendage landing zone size 31 mm X 29 mm ?  ?Landing zone could be as large as 46 mm X 32 mm when perfusion ?defect resolves. ?   ?Maximal length from the landing zone of main lobe is 38 mm. ?  ?Notes on transseptal puncture: Mid-inferior puncture advised. Device ?lead should not lead to issues with puncture by may lead to artifact ?during interventio

## 2022-01-14 NOTE — Patient Instructions (Addendum)
Take your last dose of Pradaxa this evening. ? ?Tomorrow 4/8, start warfarin '5mg'$  - 1 tablet daily except 1.5 tablets on Tuesdays and Fridays. Tomorrow, also start Lovenox '150mg'$  - 1 injection in the fatty tissue of the stomach once daily. ? ?If there is a delay in the pharmacy filling your Lovenox, stay on your Pradaxa until you can start on both warfarin and Lovenox on the same day. ? ?Recheck INR on Friday, April 14th at 2:30pm ? ?

## 2022-01-14 NOTE — Patient Instructions (Addendum)
Medication Instructions:  ?Your physician recommends that you continue on your current medications as directed. Please refer to the Current Medication list given to you today. ?*If you need a refill on your cardiac medications before your next appointment, please call your pharmacy* ? ?Lab Work: ?None. ?If you have labs (blood work) drawn today and your tests are completely normal, you will receive your results only by: ?MyChart Message (if you have MyChart) OR ?A paper copy in the mail ?If you have any lab test that is abnormal or we need to change your treatment, we will call you to review the results. ? ?Testing/Procedures: ?None. ? ?Follow-Up: ?At [facility], you and your health needs are our priority.  As part of our continuing mission to provide you with exceptional heart care, we have created designated Provider Care Teams.  These Care Teams include your primary Cardiologist (physician) and Advanced Practice Providers (APPs -  Physician Assistants and Nurse Practitioners) who all work together to provide you with the care you need, when you need it. ? ?Your physician wants you to follow-up in: I will call you to set up your TEE and follow up with Dr. Quentin Ore.  ? ?We recommend signing up for the patient portal called "MyChart".  Sign up information is provided on this After Visit Summary.  MyChart is used to connect with patients for Virtual Visits (Telemedicine).  Patients are able to view lab/test results, encounter notes, upcoming appointments, etc.  Non-urgent messages can be sent to your provider as well.   ?To learn more about what you can do with MyChart, go to [email or web address].   ? ?Any Other Special Instructions Will Be Listed Below (If Applicable). ? ? ? ? ?  ? ? ?

## 2022-01-14 NOTE — Progress Notes (Signed)
Patient ID: [name]                 DOB: 02-15-1946                      MRN: [id number] ? ? ? ? ?HPI: ?[name] is a 76 y.o. male referred by Dr. Quentin Ore to PharmD clinic for anticoagulation management. PMH is significant for permanent afib, s/p pacemaker and AV junction ablation, CAD, OSA, HLD, CHF with normalization of EF, and AAA. Pt is pending Watchman implant due to recurrent episodes of nose bleeding on his Xarelto. CT 2/16 showed LAA thrombus. Pt was changed from Xarelto to Pradaxa. Repeat TEE on 01/04/22 showed persistent LAA clot and it was recommended for pt to change to warfarin. Pt previously took warfarin and does not wish to restart.  ? ?Scheduled for appt today with Dr Quentin Ore to discuss changing to warfarin with alternative option of staying on Pradaxa and repeating TEE in 3 months. Ultimate plan to change to warfarin with Lovenox bridge. Pt with normal renal function and CBC, will dose Lovenox at 1.'5mg'$ /kg once daily. ? ? ?Past Medical History:  ?Diagnosis Date  ? AAA (abdominal aortic aneurysm) ([place])   ? Arthritis   ? Atrial fibrillation ([place])   ? permanent  ? CAD (coronary artery disease)   ? Chronic systolic dysfunction of left ventricle   ? s/p BiV ICD//downgrade to CRT-P (medtronic) on 04-25-2013 by Dr Rayann Heman  ? Colon polyps   ? Complete heart block ([place])   ? s/p AV nodal ablation for afib  ? Dysphagia   ? Elevated LFTs   ? Fatigue   ? GERD (gastroesophageal reflux disease)   ? Gout   ? Hemochromatosis   ? Hyperlipidemia   ? IFG (impaired fasting glucose)   ? Osteoarthritis   ? Sleep apnea   ? ? ?Current Outpatient Medications on File Prior to Visit  ?Medication Sig Dispense Refill  ? acetaminophen (TYLENOL) 500 MG tablet Take 500-1,000 mg by mouth every 6 (six) hours as needed for moderate pain.    ? carvedilol (COREG) 25 MG tablet Take 12.5 mg by mouth 2 (two) times daily.    ? clonazePAM (KLONOPIN) 0.5 MG tablet Take 0.5 mg by mouth at bedtime as needed (sleep).    ? colchicine 0.6  MG tablet Take 0.6 mg by mouth daily as needed (gout flare).    ? Cyanocobalamin (B-12 PO) Take 1 capsule by mouth daily.    ? dabigatran (PRADAXA) 150 MG CAPS capsule Take 1 capsule (150 mg total) by mouth 2 (two) times daily. 180 capsule 1  ? furosemide (LASIX) 20 MG tablet Take 1 tablet (20 mg total) by mouth daily. 90 tablet 3  ? Multiple Vitamins-Minerals (ZINC PO) Take 1 tablet by mouth 3 (three) times a week.    ? pantoprazole (PROTONIX) 40 MG tablet Take 1 tablet (40 mg total) by mouth daily. 90 tablet 3  ? rosuvastatin (CRESTOR) 20 MG tablet Take 1 tablet (20 mg total) by mouth daily. 90 tablet 3  ? SIMPLY SALINE NA Place 1 spray into the nose as needed.    ? traZODone (DESYREL) 50 MG tablet Take 50 mg by mouth at bedtime as needed for sleep.    ? VITAMIN D PO Take 1 capsule by mouth daily.    ? ?Current Facility-Administered Medications on File Prior to Visit  ?Medication Dose Route Frequency Provider Last Rate Last Admin  ? 0.9 %  sodium chloride infusion  500 mL Intravenous Continuous Irene Shipper, MD      ? ? ?Allergies  ?Allergen Reactions  ? Lisinopril   ?  Hot flashes  ? Niacin   ?  Hot flashes  ? ? ? ?Assessment/Plan: ? ?Anticoag - Pt with afib and upcoming Watchman procedure complicated by persistent LAA thrombus despite therapy with Xarelto and then Pradaxa. Changing to warfarin with Lovenox bridge. INR goal to be 2.5-3.5 per Dr Quentin Ore. He'll take his last dose of Pradaxa this evening, then tomorrow 4/8 start warfarin '5mg'$  daily except 7.'5mg'$  on Tuesdays and Fridays (previous dose he took on warfarin years ago). Tomorrow he will also start Lovenox '150mg'$  SQ once daily. Will recheck INR on 4/14 at [facility] per pt availability. Warfarin and dietary/drug/alcohol interactions reviewed with pt today. He was provided with GoodRx coupon for his Lovenox as well. ? ?Cheral Cappucci E. Johntae Broxterman, PharmD, BCACP, CPP ?[facility], [place], [place] [address] ?Phone: [phone]; Fax:  [phone] ?01/14/2022 2:54 PM ? ? ?

## 2022-01-16 ENCOUNTER — Encounter: Payer: Self-pay | Admitting: Cardiology

## 2022-01-17 ENCOUNTER — Encounter: Payer: Self-pay | Admitting: Cardiology

## 2022-01-18 ENCOUNTER — Encounter: Payer: Self-pay | Admitting: *Deleted

## 2022-01-19 ENCOUNTER — Telehealth: Payer: Self-pay

## 2022-01-19 NOTE — Telephone Encounter (Signed)
Lm on vm 

## 2022-01-19 NOTE — Telephone Encounter (Signed)
Patient called to let you know he cancelled the procedure with Dr. Henrene Pastor. ?

## 2022-01-19 NOTE — Telephone Encounter (Signed)
-----   Message from Irene Shipper, MD sent at 01/18/2022  3:54 PM EDT ----- ?Thanks again [employer]. ? ?Magda Paganini, ?Given the above, and the patient's severe dysphagia, I want to keep him on for his procedures scheduled. ?However, he should STAY ON COUMADIN for his procedures.  There are some limitations, but the benefits potentially outweigh the risks (particularly given the unknown duration of clot). ?PLEASE CONTACT THE PATIENT and tell him that we touched base with cardiology and we need to perform his procedures on Coumadin. ?Please convert all of this conversation into a phone note so we may refer back, if needed. ?Thank you, ?Dr. Henrene Pastor ? ? ?----- Message ----- ?From: Vickie Epley, MD ?Sent: 01/18/2022   2:46 PM EDT ?To: Jerline Pain, MD, Audrea Muscat, CMA, # ? ?Typically the change to pradaxa will resolve the clot but for him it did not. I am hoping the Coumadin with higher INR goal will do the trick. We will recheck with TEE in 3 months. ?CL ? ?----- Message ----- ?From: Irene Shipper, MD ?Sent: 01/17/2022   8:24 AM EDT ?To: Jerline Pain, MD, Audrea Muscat, CMA, # ? ?Lysbeth Galas, ?Thanks for responding.  His procedures have tentatively been set up for May 26.  Do you know how long it typically takes for clot to resolve? ?Thanks, ?John ?----- Message ----- ?From: Vickie Epley, MD ?Sent: 01/15/2022   8:45 PM EDT ?To: Jerline Pain, MD, Audrea Muscat, CMA, # ? ?Hildred Priest, ?Mr Wamble has a very large and mobile blood clot in his left atrial appendage and I am concerned that he has a very high risk of stroke in the short term until this is resolved. I would be very hesitant to hold the anticoagulant right now until the clot has resolved. Once a repeat TEE shows a resolved thrombus, I think we'd be OK to hold for a short period for the EGD/colon.  ?Lysbeth Galas T. Quentin Ore, MD, [facility], [place] ?Cardiac Electrophysiology ? ? ?----- Message ----- ?From: Irene Shipper, MD ?Sent: 01/12/2022   1:14 PM EDT ?To: Jerline Pain, MD,  Audrea Muscat, CMA, # ? ?Lavada Mesi, ?Mr. Rohrman is scheduled for endoscopic procedures, including esophageal dilation, late May.  I am inquiring about the prospects of temporarily interrupting his anticoagulation, as we have done in the past.  He tells me that he may be changing from Pradaxa to Coumadin.  I appreciate your weighing in.  Thanks. ?John ? ? ? ? ?

## 2022-01-19 NOTE — Telephone Encounter (Signed)
Lm on vm to clarify reason for cancelling - wanted to make sure he knew Dr. Henrene Pastor was willing to do his procedure while ON his blood thinner.  ?

## 2022-01-20 ENCOUNTER — Telehealth: Payer: Self-pay | Admitting: Cardiology

## 2022-01-20 NOTE — Telephone Encounter (Signed)
Patient has been scheduled for repeat TEE (3 months) for further evaluation of his LAAO thrombus. Scheduled for 04/18/22 at 10am. Called and left message for patient to call back regarding time and date of procedure. Will continue to follow  ? ? ?Kathyrn Drown NP-C ?Structural Heart Team  ?Pager: [phone] ?Phone: [phone] ? ?

## 2022-01-21 ENCOUNTER — Telehealth: Payer: Self-pay

## 2022-01-21 ENCOUNTER — Other Ambulatory Visit: Payer: Self-pay

## 2022-01-21 DIAGNOSIS — R7301 Impaired fasting glucose: Secondary | ICD-10-CM | POA: Diagnosis not present

## 2022-01-21 DIAGNOSIS — Z7901 Long term (current) use of anticoagulants: Secondary | ICD-10-CM

## 2022-01-21 DIAGNOSIS — I513 Intracardiac thrombosis, not elsewhere classified: Secondary | ICD-10-CM

## 2022-01-21 DIAGNOSIS — I4821 Permanent atrial fibrillation: Secondary | ICD-10-CM

## 2022-01-21 DIAGNOSIS — R7989 Other specified abnormal findings of blood chemistry: Secondary | ICD-10-CM | POA: Diagnosis not present

## 2022-01-21 DIAGNOSIS — M109 Gout, unspecified: Secondary | ICD-10-CM | POA: Diagnosis not present

## 2022-01-21 DIAGNOSIS — E538 Deficiency of other specified B group vitamins: Secondary | ICD-10-CM | POA: Diagnosis not present

## 2022-01-21 DIAGNOSIS — E785 Hyperlipidemia, unspecified: Secondary | ICD-10-CM | POA: Diagnosis not present

## 2022-01-21 DIAGNOSIS — Z125 Encounter for screening for malignant neoplasm of prostate: Secondary | ICD-10-CM | POA: Diagnosis not present

## 2022-01-21 NOTE — Telephone Encounter (Signed)
I called pt and went over TEE instructions with him. He will need INR 3 days prior. This has been scheduled on 7/7 at our [place] location (Coumadin clininc will be moved there by then) He also needs regular labs and is aware to have them done while at [place] that same day.  ? ?I have sent him a letter with instructions through [place]. ?

## 2022-01-21 NOTE — Telephone Encounter (Signed)
Pt has schedule TEE on 04/18/22. Pt has New Coumadin appt scheduled on 01/24/22 and will need weekly appts until INR is therapeutic.  ? ?PATIENT WILL NEED INR CHECKED ON 04/15/22 (3 DAYS PRIOR TO TEE) PER DR LAMBERT).  ? ?Called pt and reminded him of his NEW COUMADIN appt this coming Monday, 01/24/22 and also explained the importance of checking INR 3 days prior to TEE. Pt verbalized understanding.  ? ? ?

## 2022-01-24 ENCOUNTER — Ambulatory Visit (INDEPENDENT_AMBULATORY_CARE_PROVIDER_SITE_OTHER): Payer: PPO | Admitting: *Deleted

## 2022-01-24 DIAGNOSIS — I4821 Permanent atrial fibrillation: Secondary | ICD-10-CM | POA: Diagnosis not present

## 2022-01-24 DIAGNOSIS — Z5181 Encounter for therapeutic drug level monitoring: Secondary | ICD-10-CM

## 2022-01-24 DIAGNOSIS — I513 Intracardiac thrombosis, not elsewhere classified: Secondary | ICD-10-CM | POA: Diagnosis not present

## 2022-01-24 LAB — POCT INR: INR: 1.7 — AB (ref 2.0–3.0)

## 2022-01-24 MED ORDER — ENOXAPARIN SODIUM 150 MG/ML IJ SOSY: 150.0000 mg | PREFILLED_SYRINGE | INTRAMUSCULAR | 0 refills | Status: DC

## 2022-01-24 NOTE — Patient Instructions (Addendum)
A full discussion of the nature of anticoagulants has been carried out.  A benefit risk analysis has been presented to the patient, so that they understand the justification for choosing anticoagulation at this time. The need for frequent and regular monitoring, precise dosage adjustment and compliance is stressed.  Side effects of potential bleeding are discussed.  The patient should avoid any OTC items containing aspirin or ibuprofen, and should avoid great swings in general diet.  Avoid alcohol consumption.  Call if any signs of abnormal bleeding.   ?Description   ?Continue Lovenox '150mg'$  daily. Today take 1.5 tablets and tomorrow take 2 tablets then start taking 1.5 tablets except 1 tablet on Mondays, Wednesdays, and Fridays. Recheck INR on Friday. Anticoagulation Clinic [phone] ?  ?  ? ?

## 2022-01-25 ENCOUNTER — Telehealth: Payer: Self-pay

## 2022-01-25 NOTE — Telephone Encounter (Signed)
Spoke with patient to clarify why he wanted to cancel his procedure and make sure he was ok.  He stated he wanted to wait until a later date to have his procedures.  I encouraged him to call us when he is ready.  Patient agreed.  ?

## 2022-01-25 NOTE — Telephone Encounter (Signed)
? ?===  View-only below this line=== ?----- Message ----- ?From: Irene Shipper, MD ?Sent: 01/18/2022   3:59 PM EDT ?To: Jerline Pain, MD, Audrea Muscat, CMA, * ? ?Thanks again [employer]. ? ?Magda Paganini, ?Given the above, and the patient's severe dysphagia, I want to keep him on for his procedures scheduled. ?However, he should STAY ON COUMADIN for his procedures.  There are some limitations, but the benefits potentially outweigh the risks (particularly given the unknown duration of clot). ?PLEASE CONTACT THE PATIENT and tell him that we touched base with cardiology and we need to perform his procedures on Coumadin. ?Please convert all of this conversation into a phone note so we may refer back, if needed. ?Thank you, ?Dr. Henrene Pastor ? ? ?----- Message ----- ?From: Vickie Epley, MD ?Sent: 01/18/2022   2:46 PM EDT ?To: Jerline Pain, MD, Audrea Muscat, CMA, * ? ?Typically the change to pradaxa will resolve the clot but for him it did not. I am hoping the Coumadin with higher INR goal will do the trick. We will recheck with TEE in 3 months. ?CL ? ?----- Message ----- ?From: Irene Shipper, MD ?Sent: 01/17/2022   8:24 AM EDT ?To: Jerline Pain, MD, Audrea Muscat, CMA, * ? ?Lysbeth Galas, ?Thanks for responding.  His procedures have tentatively been set up for May 26.  Do you know how long it typically takes for clot to resolve? ?Thanks, ?John ?----- Message ----- ?From: Vickie Epley, MD ?Sent: 01/15/2022   8:45 PM EDT ?To: Jerline Pain, MD, Audrea Muscat, CMA, * ? ?Hildred Priest, ?Mr Resnik has a very large and mobile blood clot in his left atrial appendage and I am concerned that he has a very high risk of stroke in the short term until this is resolved. I would be very hesitant to hold the anticoagulant right now until the clot has resolved. Once a repeat TEE shows a resolved thrombus, I think we'd be OK to hold for a short period for the EGD/colon.  ?Lysbeth Galas T. Quentin Ore, MD, [facility], [place] ?Cardiac Electrophysiology ? ? ?----- Message  ----- ?From: Irene Shipper, MD ?Sent: 01/12/2022   1:14 PM EDT ?To: Jerline Pain, MD, Audrea Muscat, CMA, * ? ?Lavada Mesi, ?Mr. Lundstrom is scheduled for endoscopic procedures, including esophageal dilation, late May.  I am inquiring about the prospects of temporarily interrupting his anticoagulation, as we have done in the past.  He tells me that he may be changing from Pradaxa to Coumadin.  I appreciate your weighing in.  Thanks. ?John ? ?

## 2022-01-25 NOTE — Telephone Encounter (Signed)
Noted. ?Patient is well-informed. ?Thank you ?

## 2022-01-28 ENCOUNTER — Encounter: Payer: PPO | Admitting: Cardiology

## 2022-01-28 ENCOUNTER — Ambulatory Visit (INDEPENDENT_AMBULATORY_CARE_PROVIDER_SITE_OTHER): Payer: PPO

## 2022-01-28 ENCOUNTER — Telehealth: Payer: Self-pay

## 2022-01-28 DIAGNOSIS — Z5181 Encounter for therapeutic drug level monitoring: Secondary | ICD-10-CM

## 2022-01-28 DIAGNOSIS — I4821 Permanent atrial fibrillation: Secondary | ICD-10-CM | POA: Diagnosis not present

## 2022-01-28 LAB — POCT INR: INR: 2.4 (ref 2.0–3.0)

## 2022-01-28 NOTE — Patient Instructions (Addendum)
Description   ?Take last Lovenox '150mg'$  injection tomorrow.   ?Take 1.5 tablets today and then resume taking 1.5 tablets daily except 1 tablet on Mondays, Wednesdays, and Fridays.  ?Recheck INR on Wednesday, 02/02/22.  ?Anticoagulation Clinic [phone] ?  ?   ?

## 2022-01-28 NOTE — Telephone Encounter (Signed)
Received call from pt stating he was prescribed Prednisone '10mg'$  daily for gout. Confirmed with Jinny Blossom, PharmD that Warfarin dose does not need to be adjusted since it is a low dose of Prednisone. Pt scheduled to have INR rechecked on 02/02/22.  ?

## 2022-02-02 ENCOUNTER — Ambulatory Visit (INDEPENDENT_AMBULATORY_CARE_PROVIDER_SITE_OTHER): Payer: PPO

## 2022-02-02 DIAGNOSIS — Z5181 Encounter for therapeutic drug level monitoring: Secondary | ICD-10-CM

## 2022-02-02 DIAGNOSIS — I4821 Permanent atrial fibrillation: Secondary | ICD-10-CM

## 2022-02-02 LAB — POCT INR: INR: 2.5 (ref 2.0–3.0)

## 2022-02-02 NOTE — Patient Instructions (Signed)
Description   ?START taking 1.5 tablets daily except 1 tablet on Mondays and Fridays.  ?Recheck INR in 1 week.  ?Anticoagulation Clinic [phone] ?  ?   ?

## 2022-02-03 DIAGNOSIS — E785 Hyperlipidemia, unspecified: Secondary | ICD-10-CM | POA: Diagnosis not present

## 2022-02-03 DIAGNOSIS — M109 Gout, unspecified: Secondary | ICD-10-CM | POA: Diagnosis not present

## 2022-02-03 DIAGNOSIS — I480 Paroxysmal atrial fibrillation: Secondary | ICD-10-CM | POA: Diagnosis not present

## 2022-02-03 DIAGNOSIS — I5022 Chronic systolic (congestive) heart failure: Secondary | ICD-10-CM | POA: Diagnosis not present

## 2022-02-03 DIAGNOSIS — Z1331 Encounter for screening for depression: Secondary | ICD-10-CM | POA: Diagnosis not present

## 2022-02-03 DIAGNOSIS — I2510 Atherosclerotic heart disease of native coronary artery without angina pectoris: Secondary | ICD-10-CM | POA: Diagnosis not present

## 2022-02-03 DIAGNOSIS — I513 Intracardiac thrombosis, not elsewhere classified: Secondary | ICD-10-CM | POA: Diagnosis not present

## 2022-02-03 DIAGNOSIS — Z1339 Encounter for screening examination for other mental health and behavioral disorders: Secondary | ICD-10-CM | POA: Diagnosis not present

## 2022-02-03 DIAGNOSIS — E669 Obesity, unspecified: Secondary | ICD-10-CM | POA: Diagnosis not present

## 2022-02-03 DIAGNOSIS — R7301 Impaired fasting glucose: Secondary | ICD-10-CM | POA: Diagnosis not present

## 2022-02-03 DIAGNOSIS — D6869 Other thrombophilia: Secondary | ICD-10-CM | POA: Diagnosis not present

## 2022-02-03 DIAGNOSIS — R82998 Other abnormal findings in urine: Secondary | ICD-10-CM | POA: Diagnosis not present

## 2022-02-03 DIAGNOSIS — Z0000 Encounter for general adult medical examination without abnormal findings: Secondary | ICD-10-CM | POA: Diagnosis not present

## 2022-02-08 DIAGNOSIS — G4733 Obstructive sleep apnea (adult) (pediatric): Secondary | ICD-10-CM | POA: Diagnosis not present

## 2022-02-10 ENCOUNTER — Ambulatory Visit: Payer: PPO | Admitting: Cardiology

## 2022-02-10 ENCOUNTER — Ambulatory Visit (INDEPENDENT_AMBULATORY_CARE_PROVIDER_SITE_OTHER): Payer: PPO | Admitting: *Deleted

## 2022-02-10 ENCOUNTER — Encounter: Payer: Self-pay | Admitting: Cardiology

## 2022-02-10 DIAGNOSIS — I442 Atrioventricular block, complete: Secondary | ICD-10-CM | POA: Diagnosis not present

## 2022-02-10 DIAGNOSIS — I428 Other cardiomyopathies: Secondary | ICD-10-CM

## 2022-02-10 DIAGNOSIS — I4821 Permanent atrial fibrillation: Secondary | ICD-10-CM

## 2022-02-10 DIAGNOSIS — E7800 Pure hypercholesterolemia, unspecified: Secondary | ICD-10-CM

## 2022-02-10 DIAGNOSIS — I513 Intracardiac thrombosis, not elsewhere classified: Secondary | ICD-10-CM

## 2022-02-10 LAB — POCT INR: INR: 2.4 (ref 2.0–3.0)

## 2022-02-10 MED ORDER — WARFARIN SODIUM 5 MG PO TABS: ORAL_TABLET | ORAL | 1 refills | Status: DC

## 2022-02-10 NOTE — Patient Instructions (Signed)

## 2022-02-10 NOTE — Assessment & Plan Note (Signed)
Repeating transesophageal echocardiogram.  Looking for clearance of left atrial appendage thrombus.  Failed Pradaxa so to speak.  Now on Coumadin. ?

## 2022-02-10 NOTE — Assessment & Plan Note (Signed)
Has an upcoming repeat TEE in July.  Continue with Coumadin.  Goal INR 2.5-3. ?

## 2022-02-10 NOTE — Assessment & Plan Note (Signed)
Continue with Crestor 20 mg a day.  LDL 54.  At goal.  Myalgias.  Continue with current medication management. ?

## 2022-02-10 NOTE — Assessment & Plan Note (Signed)
Prior improvement with biventricular pacing. ?

## 2022-02-10 NOTE — Progress Notes (Signed)
?Cardiology Office Note:   ? ?Date:  02/10/2022  ? ?ID:  [name], DOB 1946/09/11, MRN [id number] ? ?PCP:  Ginger Organ., MD ?  ?[facility] Providers ?Cardiologist:  Candee Furbish, MD ?Electrophysiologist:  Vickie Epley, MD    ? ?Referring MD: Ginger Organ., MD  ? ? ?History of Present Illness:   ? ?[name] is a 75 y.o. male with left atrial appendage thrombus discovered during work-up for potential Watchman device given varicose vein bleeding who has tried Xarelto, Pradaxa and is now on Coumadin.  Has TEE in July repeat.  Has atrial fibrillation ventricular pacing repeat TEE performed on January 04, 2022 showed continued left atrial appendage thrombus.  Dr. Quentin Ore has reviewed this at length with both he and his wife. ? ?Had prior AV nodal ablation.  He is pacemaker dependent.  Has biventricular pacer in place.  Medtronic. ? ?Denies any chest pain fevers chills nausea vomiting syncope bleeding.  No strokelike symptoms. ? ?Past Medical History:  ?Diagnosis Date  ? AAA (abdominal aortic aneurysm) ([place])   ? Arthritis   ? Atrial fibrillation ([place])   ? permanent  ? CAD (coronary artery disease)   ? Chronic systolic dysfunction of left ventricle   ? s/p BiV ICD//downgrade to CRT-P (medtronic) on 04-25-2013 by Dr Rayann Heman  ? Colon polyps   ? Complete heart block ([place])   ? s/p AV nodal ablation for afib  ? Dysphagia   ? Elevated LFTs   ? Fatigue   ? GERD (gastroesophageal reflux disease)   ? Gout   ? Hemochromatosis   ? Hyperlipidemia   ? IFG (impaired fasting glucose)   ? Osteoarthritis   ? Sleep apnea   ? ? ?Past Surgical History:  ?Procedure Laterality Date  ? AV NODE ABLATION    ? BIV PACEMAKER GENERATOR CHANGE OUT  04-25-2013  ? downgrade of previously implanted CRTD to Medtronic CRTP by Dr Rayann Heman.  Previously implanted LV and 4076 RV leads were used  ? [place] N/A 12/29/2020  ? Procedure: BIV PACEMAKER GENERATOR CHANGEOUT;  Surgeon: Thompson Grayer, MD;  Location: [place] CV LAB;  Service: Cardiovascular;  Laterality: N/A;  ? CARDIAC DEFIBRILLATOR PLACEMENT  2007, 2010  ? MDT BiV ICD,  8338 RV lead, though pace/ sense is a Medtronic model 4076 - 58  lead; downgraded to CRTP on 04-25-2013 by Dr Rayann Heman  ? HAND SURGERY    ? left  ? HERNIA REPAIR    ? HYDROCELE EXCISION    ? IMPLANTABLE CARDIOVERTER DEFIBRILLATOR GENERATOR CHANGE N/A 04/25/2013  ? Procedure: IMPLANTABLE CARDIOVERTER DEFIBRILLATOR GENERATOR CHANGE;  Surgeon: Thompson Grayer, MD;  Location: [facility] CATH LAB;  Service: Cardiovascular;  Laterality: N/A;  ? PACEMAKER INSERTION  2006  ? REPLACEMENT TOTAL KNEE  2000, 2011 x2  ? bilateral  ? TEE WITHOUT CARDIOVERSION N/A 01/04/2022  ? Procedure: TRANSESOPHAGEAL ECHOCARDIOGRAM (TEE);  Surgeon: Werner Lean, MD;  Location: [facility] ENDOSCOPY;  Service: Cardiovascular;  Laterality: N/A;  ? ? ?Current Medications: ?No outpatient medications have been marked as taking for the 02/10/22 encounter (Office Visit) with Jerline Pain, MD.  ? ?Current Facility-Administered Medications for the 02/10/22 encounter (Office Visit) with Jerline Pain, MD  ?Medication  ? 0.9 %  sodium chloride infusion  ?  ? ?Allergies:   Lisinopril and Niacin  ? ?Social History  ? ?Socioeconomic History  ? Marital status: Married  ?  Spouse name: Not on file  ?  Number of children: 2  ? Years of education: Not on file  ? Highest education level: Not on file  ?Occupational History  ? Occupation: Press photographer  ?Tobacco Use  ? Smoking status: Former  ?  Packs/day: 1.00  ?  Years: 8.00  ?  Pack years: 8.00  ?  Types: Cigarettes  ?  Quit date: 10/10/1997  ?  Years since quitting: 24.3  ? Smokeless tobacco: Never  ?Vaping Use  ? Vaping Use: Never used  ?Substance and Sexual Activity  ? Alcohol use: Yes  ?  Comment: few times a week  ? Drug use: No  ? Sexual activity: Yes  ?Other Topics Concern  ? Not on file  ?Social History Narrative  ? Not on file  ? ?Social Determinants of Health  ? ?Financial Resource Strain: Not on file   ?Food Insecurity: Not on file  ?Transportation Needs: Not on file  ?Physical Activity: Not on file  ?Stress: Not on file  ?Social Connections: Not on file  ?  ? ?Family History: ?The patient's family history includes Arthritis in his father; Bone cancer in his maternal uncle; Brain cancer in his brother; Breast cancer in his cousin; Emphysema in his father. There is no history of Colon cancer. ? ?ROS:   ?Please see the history of present illness.    ? All other systems reviewed and are negative. ? ?EKGs/Labs/Other Studies Reviewed:   ? ?The following studies were reviewed today: ?Echo TEE 01/04/2022: ? 1. Left atrial size was severely dilated. A left atrial/left atrial  ?appendage thrombus was detected- there is a linear echodensity that  ?attaches lateral to the superior portion of the left atrial appendage  ?base. There is heavy smoke, the stalk  ?attachment is conceing for thrombus. Maximal projections of left atrial  ?dimensions 30 mm X 31 mm; a 35 mm Watchman FLX may be used if LAAO was  ?planned.  ? 2. Left ventricular ejection fraction, by estimation, is 50 to 55%. The  ?left ventricle has low normal function. The left ventricle has no regional  ?wall motion abnormalities.  ? 3. Right ventricular systolic function is normal. The right ventricular  ?size is normal.  ? 4. Right atrial size was severely dilated.  ? 5. 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 Mild mitral valve regurgitation. No evidence of mitral  ?stenosis.  ? 6. The aortic valve is tricuspid. Aortic valve regurgitation is mild. No  ?aortic stenosis is present.  ? 7. There is mild (Grade II) plaque involving the descending aorta.  ?  ?Cardiac CTA 11/25/2021: ?FINDINGS: ?A 120 kV prospective scan was triggered in the descending thoracic ?aorta at 111 HU's. Gantry rotation speed was 280 msecs and ?collimation was .9 mm. No beta blockade and no NTG was given. The 3D ?data set was reconstructed in 5% intervals  of the 60-80 % of the R-R ?cycle. Diastolic phases were analyzed on a dedicated work station ?using MPR, MIP and VRT modes. The patient received 95 cc of ?contrast. ?  ?Pulmonary Veins: ?  ?There is normal variant pulmonary vein drainage into the left atrium ?(3 on the right and 2 on the left) with ostial measurements as ?follows: ?  ?RUPV: 32 mm x 20 mm ?  ?RLPV: 24 mm x 21 mm ?  ?RMPV: 13 mm x 10 mm ?  ?LUPV: 25 mm x 19 mm ?  ?LLPV: 23 mm x 18 mm ?  ?Left atrium and appendage: ?  ?  Left atrium: Severely dilated left atrium. There is a perfusion ?defect in the tip of the left atrial appendage that dose not ?completely clear with delay; HU Units 46. This is concerning for ?left atrial thrombus. ?  ?Left atrial appendage: The left atrial appendage is windsock ?morphology. ?  ?Pre-procedural Measurements ?  ?Landing Zone Measurements: ?  ?Left atrial appendage landing zone size 31 mm X 29 mm ?  ?Landing zone could be as large as 46 mm X 32 mm when perfusion ?defect resolves. ?  ?Maximal length from the landing zone of main lobe is 38 mm. ?  ?Notes on transseptal puncture: Mid-inferior puncture advised. Device ?lead should not lead to issues with puncture by may lead to artifact ?during interventional imaging. ?  ?No evidence of PFO or ASD. ?  ?Comments on device selection: ?  ?Concerning that a 35 mm FLX device would not be have < 10 % ?compression. ?  ?Esophagus the esophagus runs in the left atrial midline. ?  ?Aorta:  Normal caliber.  No dissection.  No calcification. ?  ?Main pulmonary artery: Moderate dilation 33 mm. ?  ?Aortic Valve:  Tri-leaflet. No calcification. ?  ?CRT device noted; device in CS, RAAF, and RV apex. ?  ?Coronary Arteries: Normal coronary origin. The study was performed ?without use of NTG and insufficient for plaque evaluation. ?  ?Coronary Calcium Score: ?  ?Left main: 0 ?  ?Left anterior descending artery: 135 ?  ?Left circumflex artery: 11 ?  ?Right coronary artery: 0 ?  ?Total: 146 ?   ?Percentile: 42nd for age, sex, and race matched control. ?  ?Extra-cardiac findings: See attached radiology report for ?non-cardiac structures. ?  ?IMPRESSION: ?1. There is normal variant pulmonary vein drain

## 2022-02-10 NOTE — Telephone Encounter (Signed)
The patient had an appointment with Dr. Marlou Porch this morning. He was scheduled for pre-TEE visit 04/06/22. ?

## 2022-02-10 NOTE — Assessment & Plan Note (Signed)
Biventricular pacing. ?

## 2022-02-10 NOTE — Patient Instructions (Signed)
Description   ?Today take 2 tablets then start taking 1.5 tablets daily except 1 tablet on Mondays. Recheck INR in 1 week. Anticoagulation Clinic [phone] ?  ?  ?

## 2022-02-17 ENCOUNTER — Ambulatory Visit (INDEPENDENT_AMBULATORY_CARE_PROVIDER_SITE_OTHER): Payer: PPO | Admitting: *Deleted

## 2022-02-17 DIAGNOSIS — I513 Intracardiac thrombosis, not elsewhere classified: Secondary | ICD-10-CM | POA: Diagnosis not present

## 2022-02-17 DIAGNOSIS — I4821 Permanent atrial fibrillation: Secondary | ICD-10-CM | POA: Diagnosis not present

## 2022-02-17 LAB — POCT INR: INR: 3.1 — AB (ref 2.0–3.0)

## 2022-02-17 NOTE — Patient Instructions (Addendum)
Description   Continue taking 1.5 tablets daily except 1 tablet on Mondays.  Recheck INR in 1 week. Anticoagulation Clinic [phone]      

## 2022-02-24 ENCOUNTER — Ambulatory Visit (INDEPENDENT_AMBULATORY_CARE_PROVIDER_SITE_OTHER): Payer: PPO | Admitting: *Deleted

## 2022-02-24 DIAGNOSIS — Z5181 Encounter for therapeutic drug level monitoring: Secondary | ICD-10-CM | POA: Diagnosis not present

## 2022-02-24 DIAGNOSIS — I4821 Permanent atrial fibrillation: Secondary | ICD-10-CM | POA: Diagnosis not present

## 2022-02-24 DIAGNOSIS — I513 Intracardiac thrombosis, not elsewhere classified: Secondary | ICD-10-CM | POA: Diagnosis not present

## 2022-02-24 LAB — POCT INR: INR: 2.7 (ref 2.0–3.0)

## 2022-02-24 NOTE — Patient Instructions (Signed)
Description   Continue taking 1.5 tablets daily except 1 tablet on Mondays.  Recheck INR in 1 week. Anticoagulation Clinic [phone]      

## 2022-03-01 ENCOUNTER — Other Ambulatory Visit: Payer: Self-pay | Admitting: Cardiology

## 2022-03-01 NOTE — Telephone Encounter (Signed)
Prescription refill request received for warfarin Lov: [place], 02/10/2022 Next INR check: 03/04/2022 Warfarin tablet strength: '5mg'$ 

## 2022-03-04 ENCOUNTER — Other Ambulatory Visit: Payer: Self-pay | Admitting: *Deleted

## 2022-03-04 ENCOUNTER — Encounter: Payer: PPO | Admitting: Internal Medicine

## 2022-03-04 ENCOUNTER — Ambulatory Visit (INDEPENDENT_AMBULATORY_CARE_PROVIDER_SITE_OTHER): Payer: PPO | Admitting: *Deleted

## 2022-03-04 DIAGNOSIS — Z5181 Encounter for therapeutic drug level monitoring: Secondary | ICD-10-CM | POA: Diagnosis not present

## 2022-03-04 DIAGNOSIS — I4821 Permanent atrial fibrillation: Secondary | ICD-10-CM | POA: Diagnosis not present

## 2022-03-04 LAB — POCT INR: INR: 2.6 (ref 2.0–3.0)

## 2022-03-04 MED ORDER — ROSUVASTATIN CALCIUM 20 MG PO TABS: 20.0000 mg | ORAL_TABLET | Freq: Every day | ORAL | 3 refills | Status: AC

## 2022-03-04 NOTE — Patient Instructions (Signed)
Description   Continue taking 1.5 tablets daily except 1 tablet on Mondays. Recheck INR in 2 weeks. Anticoagulation Clinic [phone]

## 2022-03-11 DIAGNOSIS — G4733 Obstructive sleep apnea (adult) (pediatric): Secondary | ICD-10-CM | POA: Diagnosis not present

## 2022-03-18 ENCOUNTER — Ambulatory Visit (INDEPENDENT_AMBULATORY_CARE_PROVIDER_SITE_OTHER): Payer: PPO | Admitting: *Deleted

## 2022-03-18 DIAGNOSIS — I513 Intracardiac thrombosis, not elsewhere classified: Secondary | ICD-10-CM

## 2022-03-18 DIAGNOSIS — I4821 Permanent atrial fibrillation: Secondary | ICD-10-CM

## 2022-03-18 LAB — POCT INR: INR: 2.8 (ref 2.0–3.0)

## 2022-03-18 NOTE — Patient Instructions (Signed)
Description   Continue taking 1.5 tablets daily except 1 tablet on Mondays. Recheck INR in 3 weeks. Anticoagulation Clinic [phone]

## 2022-03-29 ENCOUNTER — Ambulatory Visit (INDEPENDENT_AMBULATORY_CARE_PROVIDER_SITE_OTHER): Payer: PPO

## 2022-03-29 DIAGNOSIS — I428 Other cardiomyopathies: Secondary | ICD-10-CM

## 2022-03-29 LAB — CUP PACEART REMOTE DEVICE CHECK
Battery Remaining Longevity: 134 mo
Battery Voltage: 3.03 V
Brady Statistic AP VP Percent: 0 %
Brady Statistic AP VS Percent: 0 %
Brady Statistic AS VP Percent: 99.96 %
Brady Statistic AS VS Percent: 0.04 %
Brady Statistic RA Percent Paced: 0 %
Brady Statistic RV Percent Paced: 99.96 %
Date Time Interrogation Session: 20230619210216
Implantable Lead Implant Date: 20050105
Implantable Lead Implant Date: 20061130
Implantable Lead Implant Date: 20061130
Implantable Lead Location: 753858
Implantable Lead Location: 753859
Implantable Lead Location: 753860
Implantable Lead Model: 4076
Implantable Lead Model: 4194
Implantable Lead Model: 5076
Implantable Pulse Generator Implant Date: 20220322
Lead Channel Impedance Value: 361 Ohm
Lead Channel Impedance Value: 399 Ohm
Lead Channel Impedance Value: 437 Ohm
Lead Channel Impedance Value: 532 Ohm
Lead Channel Impedance Value: 551 Ohm
Lead Channel Impedance Value: 589 Ohm
Lead Channel Impedance Value: 646 Ohm
Lead Channel Impedance Value: 741 Ohm
Lead Channel Impedance Value: 836 Ohm
Lead Channel Pacing Threshold Amplitude: 0.375 V
Lead Channel Pacing Threshold Pulse Width: 0.4 ms
Lead Channel Sensing Intrinsic Amplitude: 0.75 mV
Lead Channel Setting Pacing Amplitude: 1.25 V
Lead Channel Setting Pacing Amplitude: 2 V
Lead Channel Setting Pacing Pulse Width: 0.4 ms
Lead Channel Setting Pacing Pulse Width: 0.4 ms
Lead Channel Setting Sensing Sensitivity: 5.6 mV

## 2022-03-31 NOTE — H&P (View-Only) (Signed)
[facility]                                     Cardiology Office Note:    Date:  04/05/2022   ID:  [name], DOB August 11, 1946, MRN [id number]  PCP:  Ginger Organ., MD  [facility] Cardiologist:  Candee Furbish, MD  [facility] Electrophysiologist:  Vickie Epley, MD   Referring MD: Ginger Organ., MD   Chief Complaint  Patient presents with   Follow-up    Pre TEE    History of Present Illness:    [name] is a 76 y.o. male with a hx of permanent atrial fibrillation on [facility] with Xarelto, s/p pacemaker and AV junction ablation, obstructive sleep apnea, hyperlipidemia, obstructive sleep apnea, chronic systolic heart failure with normalization of EF, and AAA.    He was referred to Dr. Quentin Ore 11/10/21 for the evaluation of atrial fibrillation and possible Watchman implant. His CRT pacemaker was implanted in 04/2013 with a subsequent generator replacement in 12/2020. At the time of Dr. Claudie Revering evaluation, he was experiencing recurrent episodes of significant nose bleeding. Given this, he was interested in a long-term stroke mitigation strategy that avoids long-term exposure anticoagulation.  He is referred to discuss watchman implant.    Pre Watchman CT imaging performed 11/25/21 which showed the left atrial appendage with apparent thrombus. Per Dr. Quentin Ore, the patient was transitioned from Xarelto to Pradaxa. Repeat TEE 01/04/22 unfortunately showed persistent LAA thrombus. He was then transitioned to Coumadin with INR gaol at 2.5-3.5 and plan for repeat TEE in three months.   Today he presents for pre-TEE evaluation, scheduled for 04/18/22. He reports that he has been tolerating Coumadin quite well with no bleeding in stool or urine. He is having several symptoms today including fatigue which he states has been ongoing for many months with no real change. He is also having some left sided rib pain which is worse with inhalation, laying flat, and with  exertion. He has definite point tenderness with palpation. He has not tried any Tylenol or other pain relievers. No recent illness with fever, chills or cough. No new activities. He denies chest pain, LE edema, palpitations, orthopnea, dizziness, or syncope.   Past Medical History:  Diagnosis Date   AAA (abdominal aortic aneurysm) (HCC)    Arthritis    Atrial fibrillation (HCC)    permanent   CAD (coronary artery disease)    Chronic systolic dysfunction of left ventricle    s/p BiV ICD//downgrade to CRT-P (medtronic) on 04-25-2013 by Dr Rayann Heman   Colon polyps    Complete heart block (HCC)    s/p AV nodal ablation for afib   Dysphagia    Elevated LFTs    Fatigue    GERD (gastroesophageal reflux disease)    Gout    Hemochromatosis    Hyperlipidemia    IFG (impaired fasting glucose)    Osteoarthritis    Sleep apnea     Past Surgical History:  Procedure Laterality Date   AV NODE ABLATION     BIV PACEMAKER GENERATOR CHANGE OUT  04-25-2013   downgrade of previously implanted CRTD to Medtronic CRTP by Dr Rayann Heman.  Previously implanted LV and 4076 RV leads were used   BIV PACEMAKER GENERATOR CHANGEOUT N/A 12/29/2020   Procedure: BIV PACEMAKER GENERATOR CHANGEOUT;  Surgeon: Thompson Grayer, MD;  Location: [place] [facility];  Service: Cardiovascular;  Laterality: N/A;   CARDIAC DEFIBRILLATOR PLACEMENT  2007, 2010   MDT BiV ICD,  [phone] RV lead, though pace/ sense is a Medtronic model 4076 - 58  lead; downgraded to [place] on 04-25-2013 by Dr Rayann Heman   HAND SURGERY     left   [place] N/A 04/25/2013   Procedure: IMPLANTABLE CARDIOVERTER DEFIBRILLATOR GENERATOR CHANGE;  Surgeon: Thompson Grayer, MD;  Location: [facility] CATH LAB;  Service: Cardiovascular;  Laterality: N/A;   PACEMAKER INSERTION  2006   REPLACEMENT TOTAL KNEE  2000, 2011 x2   bilateral   TEE WITHOUT CARDIOVERSION N/A 01/04/2022   Procedure: TRANSESOPHAGEAL  ECHOCARDIOGRAM (TEE);  Surgeon: Werner Lean, MD;  Location: [facility] ENDOSCOPY;  Service: Cardiovascular;  Laterality: N/A;    Current Medications: Current Meds  Medication Sig   acetaminophen (TYLENOL) 500 MG tablet Take 500-1,000 mg by mouth every 6 (six) hours as needed for moderate pain.   carvedilol (COREG) 25 MG tablet Take 12.5 mg by mouth 2 (two) times daily.   clonazePAM (KLONOPIN) 0.5 MG tablet Take 0.5 mg by mouth at bedtime as needed (sleep).   colchicine 0.6 MG tablet Take 0.6 mg by mouth daily as needed (gout flare).   furosemide (LASIX) 20 MG tablet Take 1 tablet (20 mg total) by mouth daily.   Multiple Vitamins-Minerals (ZINC PO) Take 1 tablet by mouth 3 (three) times a week.   rosuvastatin (CRESTOR) 20 MG tablet Take 1 tablet (20 mg total) by mouth daily.   SIMPLY SALINE NA Place 1 spray into the nose as needed.   tadalafil (CIALIS) 20 MG tablet as needed.   traZODone (DESYREL) 50 MG tablet Take 50 mg by mouth at bedtime as needed for sleep.   VITAMIN D PO Take 1 capsule by mouth daily.   warfarin (COUMADIN) 5 MG tablet Take 1 to 1&1/2  tablets by mouth daily as directed by the coumadin clinic.   Current Facility-Administered Medications for the 04/04/22 encounter (Office Visit) with [facility]  Medication   0.9 %  sodium chloride infusion     Allergies:   Lisinopril and Niacin   Social History   Socioeconomic History   Marital status: Married    Spouse name: Not on file   Number of children: 2   Years of education: Not on file   Highest education level: Not on file  Occupational History   Occupation: sales  Tobacco Use   Smoking status: Former    Packs/day: 1.00    Years: 8.00    Total pack years: 8.00    Types: Cigarettes    Quit date: 10/10/1997    Years since quitting: 24.5   Smokeless tobacco: Never  Vaping Use   Vaping Use: Never used  Substance and Sexual Activity   Alcohol use: Yes    Comment: few times a week   Drug  use: No   Sexual activity: Yes  Other Topics Concern   Not on file  Social History Narrative   Not on file   Social Determinants of Health   Financial Resource Strain: Not on file  Food Insecurity: Not on file  Transportation Needs: Not on file  Physical Activity: Not on file  Stress: Not on file  Social Connections: Not on file     Family History: The patient's family history includes Arthritis in his father; Bone cancer in his maternal  uncle; Brain cancer in his brother; Breast cancer in his cousin; Emphysema in his father. There is no history of Colon cancer.  ROS:   Please see the history of present illness.    All other systems reviewed and are negative.  EKGs/Labs/Other Studies Reviewed:    The following studies were reviewed today:  TEE 01/04/22:   1. Left atrial size was severely dilated. A left atrial/left atrial  appendage thrombus was detected- there is a linear echodensity that  attaches lateral to the superior portion of the left atrial appendage  base. There is heavy smoke, the stalk  attachment is conceing for thrombus. Maximal projections of left atrial  dimensions 30 mm X 31 mm; a 35 mm Watchman FLX may be used if LAAO was  planned.   2. Left ventricular ejection fraction, by estimation, is 50 to 55%. The  left ventricle has low normal function. The left ventricle has no regional  wall motion abnormalities.   3. Right ventricular systolic function is normal. The right ventricular  size is normal.   4. Right atrial size was severely dilated.   5. 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 Mild mitral valve regurgitation. No evidence of mitral  stenosis.   6. The aortic valve is tricuspid. Aortic valve regurgitation is mild. No  aortic stenosis is present.   7. There is mild (Grade II) plaque involving the descending aorta.   EKG:  EKG is ordered today.  The ekg ordered today demonstrates AF with V pacing, HR  125bpm>>HR improved on exam   Recent Labs: 04/24/2021: B Natriuretic Peptide 139.3 12/20/2021: BUN 14; Creatinine, Ser 0.94; Hemoglobin 13.4; Platelets 183; Potassium 4.3; Sodium 140  Recent Lipid Panel    Component Value Date/Time   CHOL 130 03/06/2019 1404   TRIG 76 03/06/2019 1404   HDL 61 03/06/2019 1404   CHOLHDL 2.1 03/06/2019 1404   CHOLHDL 2 01/08/2015 0843   VLDL 11.0 01/08/2015 0843   LDLCALC 54 03/06/2019 1404   Risk Assessment/Calculations:    CHA2DS2-VASc Score = 3 [ CHF History: 1, HTN History: 0, Diabetes History: 0, Stroke History: 0, Vascular Disease History: 1, Age Score: 1, Gender Score: 0].  Therefore, the patient's annual risk of stroke is 3.2 %.      HAS-BLED score: 3     12/20/21 1145  HAS-BLED Score  Hypertension (Uncontrolled, SBP > 160 mmHg) 0  Abnormal Renal Function  0  Abnormal Liver Function  0  Stroke History 0  Prior major bleeding or predisposition to bleeding 1  Labile INR 0  Age > 65 1  Medication usage predisposing to bleeding 1  Alcohol use 0  HAS-BLED Score 3  Yearly Major Bleeding Risk % 3.74     Physical Exam:    VS:  BP 118/86   Pulse (!) 124   Ht '6\' 5"'$  (1.956 m)   Wt 230 lb (104.3 kg)   SpO2 97%   BMI 27.27 kg/m     Wt Readings from Last 3 Encounters:  04/04/22 230 lb (104.3 kg)  02/10/22 230 lb 3.2 oz (104.4 kg)  01/14/22 235 lb (106.6 kg)    General: Well developed, well nourished, NAD Lungs:Clear to ausculation bilaterally. No wheezes. Breathing is unlabored. Cardiovascular: Irregularly irregular. No murmurs Extremities: No edema. Neuro: Alert and oriented. No focal deficits. No facial asymmetry. MAE spontaneously. Psych: Responds to questions appropriately with normal affect.    ASSESSMENT/PLAN:    Atrial appendage thrombus: Found  on pre Watchman CT. He was transitioned to Pradaxa with plans for follow up TEE 01/04/22 which showed persistent thrombus. He was then transitioned to Coumadin with INR gaol at 2.5-3.5  and plan for repeat TEE in three months. He continues to tolerate Coumadin with no issues. Scheduled for repeat TEE 7/10. Obtain CBC, BMET with INR lab 04/15/22.   Costochondral left chest pain: Pain worse with inhalation, laying flat, and with exertion. He has definite point tenderness with palpation. He has not tried any Tylenol or other pain relievers>>encouraged to try this along with heat packs. No recent illness with fever, chills or cough. No new activities. Will obtain CXR.   PAF: Permanent atrial fibrillation status post AV nodal ablation. Pacemaker placement. Last remote histograms reviewed with patient given elevated HR on EKG. Histograms appear stable. Continue current regimen    NICM: Last echocardiogram 05/03/21 with LVEF at 50-55%. Appears euvolemic today with no changes needed. CRT therapy.    Hx of AV block with PPM placement: Gen change out 12/2020. V paced on EKG today.      Medication Adjustments/Labs and Tests Ordered: Current medicines are reviewed at length with the patient today.  Concerns regarding medicines are outlined above.  Orders Placed This Encounter  Procedures   DG Chest 2 View   EKG 12-Lead   No orders of the defined types were placed in this encounter.   Patient Instructions  Medication Instructions:  Your physician recommends that you continue on your current medications as directed. Please refer to the Current Medication list given to you today.  *If you need a refill on your cardiac medications before your next appointment, please call your pharmacy*   Lab Work: None ordered   If you have labs (blood work) drawn today and your tests are completely normal, you will receive your results only by: [place] (if you have MyChart) OR A paper copy in the mail If you have any lab test that is abnormal or we need to change your treatment, we will call you to review the results.   Testing/Procedures: A chest x-ray takes a picture of the organs and  structures inside the chest, including the heart, lungs, and blood vessels. This test can show several things, including, whether the heart is enlarges; whether fluid is building up in the lungs; and whether pacemaker / defibrillator leads are still in place.  TODAY, GO TO [facility] [address], [address]    Follow-Up: At [facility], you and your health needs are our priority.  As part of our continuing mission to provide you with exceptional heart care, we have created designated Provider Care Teams.  These Care Teams include your primary Cardiologist (physician) and Advanced Practice Providers (APPs -  Physician Assistants and Nurse Practitioners) who all work together to provide you with the care you need, when you need it.  We recommend signing up for the patient portal called "MyChart".  Sign up information is provided on this After Visit Summary.  MyChart is used to connect with patients for Virtual Visits (Telemedicine).  Patients are able to view lab/test results, encounter notes, upcoming appointments, etc.  Non-urgent messages can be sent to your provider as well.   To learn more about what you can do with MyChart, go to [email or web address].    Your next appointment:   AS SCHEDULED  The format for your next appointment:     Provider:       Other Instructions   Important  Information About Sugar          Signed, Kathyrn Drown, NP  04/05/2022 1:32 PM    [facility]

## 2022-04-04 ENCOUNTER — Ambulatory Visit
Admission: RE | Admit: 2022-04-04 | Discharge: 2022-04-04 | Disposition: A | Discharge status: Home / Self Care | Payer: PPO | Source: Ambulatory Visit | Attending: Cardiology | Admitting: Cardiology

## 2022-04-04 ENCOUNTER — Ambulatory Visit: Payer: PPO | Admitting: Cardiology

## 2022-04-04 VITALS — BP 118/86 | HR 124 | Ht 77.0 in | Wt 230.0 lb

## 2022-04-04 DIAGNOSIS — R071 Chest pain on breathing: Secondary | ICD-10-CM

## 2022-04-04 DIAGNOSIS — I513 Intracardiac thrombosis, not elsewhere classified: Secondary | ICD-10-CM

## 2022-04-04 DIAGNOSIS — I4821 Permanent atrial fibrillation: Secondary | ICD-10-CM | POA: Diagnosis not present

## 2022-04-04 DIAGNOSIS — I4811 Longstanding persistent atrial fibrillation: Secondary | ICD-10-CM

## 2022-04-04 DIAGNOSIS — R079 Chest pain, unspecified: Secondary | ICD-10-CM | POA: Diagnosis not present

## 2022-04-04 DIAGNOSIS — I442 Atrioventricular block, complete: Secondary | ICD-10-CM

## 2022-04-06 ENCOUNTER — Encounter (HOSPITAL_COMMUNITY): Payer: Self-pay | Admitting: Cardiovascular Disease

## 2022-04-06 ENCOUNTER — Ambulatory Visit (INDEPENDENT_AMBULATORY_CARE_PROVIDER_SITE_OTHER): Payer: PPO

## 2022-04-06 ENCOUNTER — Ambulatory Visit: Payer: PPO

## 2022-04-06 DIAGNOSIS — I4821 Permanent atrial fibrillation: Secondary | ICD-10-CM

## 2022-04-06 LAB — POCT INR: INR: 2.8 (ref 2.0–3.0)

## 2022-04-06 NOTE — Patient Instructions (Signed)
Description   Continue taking 1.5 tablets daily except 1 tablet on Mondays.  Recheck INR in 1 week. Anticoagulation Clinic [phone]

## 2022-04-10 DIAGNOSIS — G4733 Obstructive sleep apnea (adult) (pediatric): Secondary | ICD-10-CM | POA: Diagnosis not present

## 2022-04-13 NOTE — Progress Notes (Signed)
Remote pacemaker transmission.   

## 2022-04-15 ENCOUNTER — Ambulatory Visit (INDEPENDENT_AMBULATORY_CARE_PROVIDER_SITE_OTHER): Payer: PPO | Admitting: *Deleted

## 2022-04-15 ENCOUNTER — Other Ambulatory Visit: Payer: Self-pay | Admitting: *Deleted

## 2022-04-15 DIAGNOSIS — I4821 Permanent atrial fibrillation: Secondary | ICD-10-CM

## 2022-04-15 DIAGNOSIS — Z5181 Encounter for therapeutic drug level monitoring: Secondary | ICD-10-CM | POA: Diagnosis not present

## 2022-04-15 LAB — BASIC METABOLIC PANEL
BUN/Creatinine Ratio: 18 (ref 10–24)
BUN: 18 mg/dL (ref 8–27)
CO2: 27 mmol/L (ref 20–29)
Calcium: 9.6 mg/dL (ref 8.6–10.2)
Chloride: 100 mmol/L (ref 96–106)
Creatinine, Ser: 1.01 mg/dL (ref 0.76–1.27)
Glucose: 134 mg/dL — ABNORMAL HIGH (ref 70–99)
Potassium: 4.7 mmol/L (ref 3.5–5.2)
Sodium: 142 mmol/L (ref 134–144)
eGFR: 78 mL/min/{1.73_m2} (ref >59)

## 2022-04-15 LAB — POCT INR: INR: 3.5 — AB (ref 2.0–3.0)

## 2022-04-15 MED ORDER — WARFARIN SODIUM 5 MG PO TABS: ORAL_TABLET | ORAL | 0 refills | Status: DC

## 2022-04-15 NOTE — Telephone Encounter (Signed)
Prescription refill request received for warfarin Lov: Mcdaniel  Next INR check: 04/06/2022 Warfarin tablet strength:

## 2022-04-15 NOTE — Patient Instructions (Signed)
Description   Continue taking 1.5 tablets daily except 1 tablet on Mondays.  Recheck INR in 1 week. Anticoagulation Clinic [phone] or [phone]

## 2022-04-16 LAB — CBC
Hematocrit: 41.2 % (ref 37.5–51.0)
Hemoglobin: 13.7 g/dL (ref 13.0–17.7)
MCH: 32.5 pg (ref 26.6–33.0)
MCHC: 33.3 g/dL (ref 31.5–35.7)
MCV: 98 fL — ABNORMAL HIGH (ref 79–97)
Platelets: 203 10*3/uL (ref 150–450)
RBC: 4.21 x10E6/uL (ref 4.14–5.80)
RDW: 12.7 % (ref 11.6–15.4)
WBC: 4.9 10*3/uL (ref 3.4–10.8)

## 2022-04-17 NOTE — Anesthesia Preprocedure Evaluation (Signed)
Anesthesia Evaluation  Patient identified by MRN, date of birth, ID band Patient awake    Reviewed: Allergy & Precautions, NPO status , Patient's Chart, lab work & pertinent test results  History of Anesthesia Complications Negative for: history of anesthetic complications  Airway Mallampati: II  TM Distance: >3 FB Neck ROM: Full    Dental  (+) Poor Dentition, Dental Advisory Given   Pulmonary shortness of breath, sleep apnea , former smoker,    Pulmonary exam normal        Cardiovascular hypertension, Pt. on home beta blockers and Pt. on medications + CAD  + dysrhythmias Atrial Fibrillation + pacemaker  Rhythm:Regular Rate:Normal  Echo 12/2021 IMPRESSIONS    1. Left atrial size was severely dilated. A left atrial/left atrial  appendage thrombus was detected- there is a linear echodensity that  attaches lateral to the superior portion of the left atrial appendage  base. There is heavy smoke, the stalk  attachment is conceing for thrombus. Maximal projections of left atrial  dimensions 30 mm X 31 mm; a 35 mm Watchman FLX may be used if LAAO was  planned.  2. Left ventricular ejection fraction, by estimation, is 50 to 55%. The  left ventricle has low normal function. The left ventricle has no regional  wall motion abnormalities.  3. Right ventricular systolic function is normal. The right ventricular  size is normal.  4. Right atrial size was severely dilated.  5. 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 Mild mitral valve regurgitation. No evidence of mitral  stenosis.  6. The aortic valve is tricuspid. Aortic valve regurgitation is mild. No  aortic stenosis is present.  7. There is mild (Grade II) plaque involving the descending aorta.    Neuro/Psych PSYCHIATRIC DISORDERS Anxiety  Neuromuscular disease    GI/Hepatic Neg liver ROS, GERD  ,  Endo/Other  negative endocrine  ROS  Renal/GU negative Renal ROS     Musculoskeletal  (+) Arthritis ,   Abdominal   Peds  Hematology negative hematology ROS (+)   Anesthesia Other Findings   Reproductive/Obstetrics                            Anesthesia Physical  Anesthesia Plan  ASA: 3  Anesthesia Plan: MAC   Post-op Pain Management: Minimal or no pain anticipated   Induction: Intravenous  PONV Risk Score and Plan: 1 and Propofol infusion, TIVA and Treatment may vary due to age or medical condition  Airway Management Planned: Natural Airway  Additional Equipment:   Intra-op Plan:   Post-operative Plan:   Informed Consent: I have reviewed the patients History and Physical, chart, labs and discussed the procedure including the risks, benefits and alternatives for the proposed anesthesia with the patient or authorized representative who has indicated his/her understanding and acceptance.     Dental advisory given  Plan Discussed with: Anesthesiologist and CRNA  Anesthesia Plan Comments:        Anesthesia Quick Evaluation

## 2022-04-18 ENCOUNTER — Ambulatory Visit (HOSPITAL_BASED_OUTPATIENT_CLINIC_OR_DEPARTMENT_OTHER): Payer: PPO | Admitting: Anesthesiology

## 2022-04-18 ENCOUNTER — Ambulatory Visit (HOSPITAL_COMMUNITY)
Admission: RE | Admit: 2022-04-18 | Discharge: 2022-04-18 | Disposition: A | Discharge status: Home / Self Care | Payer: PPO | Attending: Cardiovascular Disease | Admitting: Cardiovascular Disease

## 2022-04-18 ENCOUNTER — Ambulatory Visit (HOSPITAL_BASED_OUTPATIENT_CLINIC_OR_DEPARTMENT_OTHER)
Admission: RE | Admit: 2022-04-18 | Discharge: 2022-04-18 | Disposition: A | Discharge status: Home / Self Care | Payer: PPO | Source: Ambulatory Visit | Attending: Cardiovascular Disease | Admitting: Cardiovascular Disease

## 2022-04-18 ENCOUNTER — Other Ambulatory Visit: Payer: Self-pay

## 2022-04-18 ENCOUNTER — Encounter (HOSPITAL_COMMUNITY)
Admission: RE | Disposition: A | Discharge status: Home / Self Care | Payer: PPO | Source: Home / Self Care | Attending: Cardiovascular Disease

## 2022-04-18 ENCOUNTER — Encounter (HOSPITAL_COMMUNITY): Payer: Self-pay | Admitting: Cardiovascular Disease

## 2022-04-18 ENCOUNTER — Ambulatory Visit (HOSPITAL_COMMUNITY): Payer: PPO | Admitting: Anesthesiology

## 2022-04-18 DIAGNOSIS — Z95810 Presence of automatic (implantable) cardiac defibrillator: Secondary | ICD-10-CM | POA: Insufficient documentation

## 2022-04-18 DIAGNOSIS — I2510 Atherosclerotic heart disease of native coronary artery without angina pectoris: Secondary | ICD-10-CM

## 2022-04-18 DIAGNOSIS — I4891 Unspecified atrial fibrillation: Secondary | ICD-10-CM | POA: Diagnosis not present

## 2022-04-18 DIAGNOSIS — G4733 Obstructive sleep apnea (adult) (pediatric): Secondary | ICD-10-CM | POA: Diagnosis not present

## 2022-04-18 DIAGNOSIS — I714 Abdominal aortic aneurysm, without rupture, unspecified: Secondary | ICD-10-CM | POA: Insufficient documentation

## 2022-04-18 DIAGNOSIS — Z87891 Personal history of nicotine dependence: Secondary | ICD-10-CM | POA: Diagnosis not present

## 2022-04-18 DIAGNOSIS — I340 Nonrheumatic mitral (valve) insufficiency: Secondary | ICD-10-CM | POA: Insufficient documentation

## 2022-04-18 DIAGNOSIS — E785 Hyperlipidemia, unspecified: Secondary | ICD-10-CM | POA: Insufficient documentation

## 2022-04-18 DIAGNOSIS — I4821 Permanent atrial fibrillation: Secondary | ICD-10-CM | POA: Diagnosis not present

## 2022-04-18 DIAGNOSIS — I10 Essential (primary) hypertension: Secondary | ICD-10-CM

## 2022-04-18 DIAGNOSIS — I748 Embolism and thrombosis of other arteries: Secondary | ICD-10-CM | POA: Diagnosis not present

## 2022-04-18 DIAGNOSIS — I513 Intracardiac thrombosis, not elsewhere classified: Secondary | ICD-10-CM | POA: Insufficient documentation

## 2022-04-18 DIAGNOSIS — I442 Atrioventricular block, complete: Secondary | ICD-10-CM | POA: Diagnosis not present

## 2022-04-18 DIAGNOSIS — Z7901 Long term (current) use of anticoagulants: Secondary | ICD-10-CM | POA: Diagnosis not present

## 2022-04-18 DIAGNOSIS — I5022 Chronic systolic (congestive) heart failure: Secondary | ICD-10-CM | POA: Diagnosis not present

## 2022-04-18 DIAGNOSIS — F419 Anxiety disorder, unspecified: Secondary | ICD-10-CM | POA: Diagnosis not present

## 2022-04-18 DIAGNOSIS — I700 Atherosclerosis of aorta: Secondary | ICD-10-CM | POA: Diagnosis not present

## 2022-04-18 HISTORY — PX: TEE WITHOUT CARDIOVERSION: SHX5443

## 2022-04-18 SURGERY — ECHOCARDIOGRAM, TRANSESOPHAGEAL
Anesthesia: Monitor Anesthesia Care

## 2022-04-18 MED ORDER — BUTAMBEN-TETRACAINE-BENZOCAINE 2-2-14 % EX AERO
INHALATION_SPRAY | CUTANEOUS | Status: DC | PRN
  Administered 2022-04-18: 1 via TOPICAL

## 2022-04-18 MED ORDER — LIDOCAINE HCL (CARDIAC) PF 100 MG/5ML IV SOSY
PREFILLED_SYRINGE | INTRAVENOUS | Status: DC | PRN
  Administered 2022-04-18: 40 mg via INTRAVENOUS

## 2022-04-18 MED ORDER — PROPOFOL 500 MG/50ML IV EMUL
INTRAVENOUS | Status: DC | PRN
  Administered 2022-04-18: 125 ug/kg/min via INTRAVENOUS

## 2022-04-18 MED ORDER — SODIUM CHLORIDE 0.9 % IV SOLN: INTRAVENOUS | Status: DC

## 2022-04-18 MED ORDER — PROPOFOL 10 MG/ML IV BOLUS
INTRAVENOUS | Status: DC | PRN
  Administered 2022-04-18: 30 mg via INTRAVENOUS

## 2022-04-18 NOTE — CV Procedure (Signed)
    TRANSESOPHAGEAL ECHOCARDIOGRAM   NAME:  [name]    MRN: [id number] DOB:  Jun 26, 1946    ADMIT DATE: 04/18/2022  INDICATIONS: LAA thrombus  PROCEDURE:   Informed consent was obtained prior to the procedure. The risks, benefits and alternatives for the procedure were discussed and the patient comprehended these risks.  Risks include, but are not limited to, cough, sore throat, vomiting, nausea, somnolence, esophageal and stomach trauma or perforation, bleeding, low blood pressure, aspiration, pneumonia, infection, trauma to the teeth and death.    Procedural time out performed. The oropharynx was anesthetized with topical 1% benzocaine.    Anesthesia was administered by Dr. Tobias Alexander.  The patient was administered 340 mg of propofol and 40 mg of lidocaine to achieve and maintain moderate conscious sedation.  The patient's heart rate, blood pressure, and oxygen saturation are monitored continuously during the procedure. The period of conscious sedation is 16 minutes, of which I was present face-to-face 100% of this time.   The transesophageal probe was inserted in the esophagus and stomach without difficulty and multiple views were obtained.   COMPLICATIONS:    There were no immediate complications.  KEY FINDINGS:  Persistent LAA thrombus/sludge.  LVEF 45-50%.  Full report to follow. Further management per primary team.   [place] Bells T. Audie Box, MD, [place]  [address], [place], [place] [address] [phone]  10:12 AM

## 2022-04-18 NOTE — Discharge Instructions (Signed)

## 2022-04-18 NOTE — Anesthesia Postprocedure Evaluation (Signed)
Anesthesia Post Note  Patient: [name]  Procedure(s) Performed: TRANSESOPHAGEAL ECHOCARDIOGRAM (TEE)     Patient location during evaluation: Endoscopy Anesthesia Type: MAC Level of consciousness: awake and alert Pain management: pain level controlled Vital Signs Assessment: post-procedure vital signs reviewed and stable Respiratory status: spontaneous breathing and respiratory function stable Cardiovascular status: stable Postop Assessment: no apparent nausea or vomiting Anesthetic complications: no   No notable events documented.  Last Vitals:  Vitals:   04/18/22 1020 04/18/22 1030  BP: 105/66 114/72  Pulse: 61 65  Resp: (!) 8 18  Temp:    SpO2: 97% 97%    Last Pain:  Vitals:   04/18/22 1040  TempSrc:   PainSc: 0-No pain                 Malik Ruffino DANIEL

## 2022-04-18 NOTE — Transfer of Care (Signed)
Immediate Anesthesia Transfer of Care Note  Patient: [name]  Procedure(s) Performed: TRANSESOPHAGEAL ECHOCARDIOGRAM (TEE)  Patient Location: PACU  Anesthesia Type:MAC  Level of Consciousness: drowsy  Airway & Oxygen Therapy: Patient Spontanous Breathing and Patient connected to nasal cannula oxygen  Post-op Assessment: Report given to RN and Post -op Vital signs reviewed and stable  Post vital signs: Reviewed and stable  Last Vitals:  Vitals Value Taken Time  BP 105/66 04/18/22 1020  Temp    Pulse 60 04/18/22 1020  Resp 8 04/18/22 1020  SpO2 95 % 04/18/22 1020  Vitals shown include unvalidated device data.  Last Pain:  Vitals:   04/18/22 0910  TempSrc: Temporal  PainSc: 0-No pain         Complications: No notable events documented.

## 2022-04-18 NOTE — Interval H&P Note (Signed)
History and Physical Interval Note:  04/18/2022 9:35 AM  [name]  has presented today for surgery, with the diagnosis of AFIB.  The various methods of treatment have been discussed with the patient and family. After consideration of risks, benefits and other options for treatment, the patient has consented to  Procedure(s): TRANSESOPHAGEAL ECHOCARDIOGRAM (TEE) (N/A) as a surgical intervention.  The patient's history has been reviewed, patient examined, no change in status, stable for surgery.  I have reviewed the patient's chart and labs.  Questions were answered to the patient's satisfaction.    NPO for TEE for LAA thrombus.   [place] Bells T. Audie Box, MD, [place]  [address] [place], [place] [address] [phone]  9:36 AM

## 2022-04-19 ENCOUNTER — Encounter (HOSPITAL_COMMUNITY): Payer: Self-pay | Admitting: Cardiovascular Disease

## 2022-04-25 ENCOUNTER — Ambulatory Visit (INDEPENDENT_AMBULATORY_CARE_PROVIDER_SITE_OTHER): Payer: PPO

## 2022-04-25 ENCOUNTER — Ambulatory Visit: Payer: PPO | Admitting: Cardiology

## 2022-04-25 ENCOUNTER — Encounter: Payer: Self-pay | Admitting: Cardiology

## 2022-04-25 VITALS — BP 124/78 | HR 83 | Ht 77.0 in | Wt 230.8 lb

## 2022-04-25 DIAGNOSIS — Z5181 Encounter for therapeutic drug level monitoring: Secondary | ICD-10-CM

## 2022-04-25 DIAGNOSIS — I4821 Permanent atrial fibrillation: Secondary | ICD-10-CM

## 2022-04-25 DIAGNOSIS — I513 Intracardiac thrombosis, not elsewhere classified: Secondary | ICD-10-CM

## 2022-04-25 DIAGNOSIS — I442 Atrioventricular block, complete: Secondary | ICD-10-CM | POA: Diagnosis not present

## 2022-04-25 LAB — POCT INR: INR: 3.1 — AB (ref 2.0–3.0)

## 2022-04-25 NOTE — Patient Instructions (Signed)
Medication Instructions:  ?Your physician recommends that you continue on your current medications as directed. Please refer to the Current Medication list given to you today. ?*If you need a refill on your cardiac medications before your next appointment, please call your pharmacy* ? ?Lab Work: ?None. ?If you have labs (blood work) drawn today and your tests are completely normal, you will receive your results only by: ?MyChart Message (if you have MyChart) OR ?A paper copy in the mail ?If you have any lab test that is abnormal or we need to change your treatment, we will call you to review the results. ? ?Testing/Procedures: ?None. ? ?Follow-Up: ?At [facility], you and your health needs are our priority.  As part of our continuing mission to provide you with exceptional heart care, we have created designated Provider Care Teams.  These Care Teams include your primary Cardiologist (physician) and Advanced Practice Providers (APPs -  Physician Assistants and Nurse Practitioners) who all work together to provide you with the care you need, when you need it. ? ?Your physician wants you to follow-up in: 6 months with Cameron Lambert, MD  ?  You will receive a reminder letter in the mail two months in advance. If you don't receive a letter, please call our office to schedule the follow-up appointment. ? ?We recommend signing up for the patient portal called "MyChart".  Sign up information is provided on this After Visit Summary.  MyChart is used to connect with patients for Virtual Visits (Telemedicine).  Patients are able to view lab/test results, encounter notes, upcoming appointments, etc.  Non-urgent messages can be sent to your provider as well.   ?To learn more about what you can do with MyChart, go to [email or web address].   ? ?Any Other Special Instructions Will Be Listed Below (If Applicable). ? ? ? ? ?  ? ? ? ? ?  ? ? ?

## 2022-04-25 NOTE — Progress Notes (Signed)
Electrophysiology Office Follow up Visit Note:    Date:  04/25/2022   ID:  [name], DOB 1946-04-25, MRN [id number]  PCP:  Ginger Organ., MD  [facility] Cardiologist:  Candee Furbish, MD  [facility] Electrophysiologist:  Vickie Epley, MD    Interval History:    [name] is a 76 y.o. male who presents for a follow up visit. They were last seen in clinic January 14, 2022.  The patient sees me for permanent atrial fibrillation and a left atrial appendage thrombus.  He has a pacemaker in situ that is functioning appropriately.  He was originally referred to me to discuss watchman implant but this was before the left atrial appendage thrombus was discovered.  He and his wife are planning to move to [place] in the next couple of months.       Past Medical History:  Diagnosis Date   AAA (abdominal aortic aneurysm) (HCC)    Arthritis    Atrial fibrillation (HCC)    permanent   CAD (coronary artery disease)    Chronic systolic dysfunction of left ventricle    s/p BiV ICD//downgrade to CRT-P (medtronic) on 04-25-2013 by Dr Rayann Heman   Colon polyps    Complete heart block (HCC)    s/p AV nodal ablation for afib   Dysphagia    Elevated LFTs    Fatigue    GERD (gastroesophageal reflux disease)    Gout    Hemochromatosis    Hyperlipidemia    IFG (impaired fasting glucose)    Osteoarthritis    Sleep apnea     Past Surgical History:  Procedure Laterality Date   AV NODE ABLATION     BIV PACEMAKER GENERATOR CHANGE OUT  04-25-2013   downgrade of previously implanted CRTD to Medtronic CRTP by Dr Rayann Heman.  Previously implanted LV and 4076 RV leads were used   BIV PACEMAKER GENERATOR CHANGEOUT N/A 12/29/2020   Procedure: BIV PACEMAKER GENERATOR CHANGEOUT;  Surgeon: Thompson Grayer, MD;  Location: [place] CV [facility];  Service: Cardiovascular;  Laterality: N/A;   CARDIAC DEFIBRILLATOR PLACEMENT  2007, 2010   MDT BiV ICD,  [phone] RV lead, though pace/ sense is a  Medtronic model 4076 - 58  lead; downgraded to [place] on 04-25-2013 by Dr Rayann Heman   HAND SURGERY     left   HERNIA REPAIR     HYDROCELE EXCISION     IMPLANTABLE CARDIOVERTER DEFIBRILLATOR GENERATOR CHANGE N/A 04/25/2013   Procedure: IMPLANTABLE CARDIOVERTER DEFIBRILLATOR GENERATOR CHANGE;  Surgeon: Thompson Grayer, MD;  Location: [facility] C[facility] LAB;  Service: Cardiovascular;  Laterality: N/A;   PACEMAKER INSERTION  2006   REPLACEMENT TOTAL KNEE  2000, 2011 x2   bilateral   TEE WITHOUT CARDIOVERSION N/A 01/04/2022   Procedure: TRANSESOPHAGEAL ECHOCARDIOGRAM (TEE);  Surgeon: Werner Lean, MD;  Location: [facility] ENDOSCOPY;  Service: Cardiovascular;  Laterality: N/A;   TEE WITHOUT CARDIOVERSION N/A 04/18/2022   Procedure: TRANSESOPHAGEAL ECHOCARDIOGRAM (TEE);  Surgeon: Geralynn Rile, MD;  Location: [place];  Service: Cardiovascular;  Laterality: N/A;    Current Medications: Current Meds  Medication Sig   carvedilol (COREG) 25 MG tablet Take 12.5 mg by mouth 2 (two) times daily.   cholecalciferol (VITAMIN D3) 25 MCG (1000 UNIT) tablet Take 1,000 Units by mouth 3 (three) times a week.   colchicine 0.6 MG tablet Take 0.6 mg by mouth daily as needed (gout flare).   furosemide (LASIX) 20 MG tablet Take 1 tablet (20 mg total)  by mouth daily.   rosuvastatin (CRESTOR) 20 MG tablet Take 1 tablet (20 mg total) by mouth daily.   tadalafil (CIALIS) 20 MG tablet Take 20 mg by mouth daily as needed for erectile dysfunction.   traZODone (DESYREL) 50 MG tablet Take 50 mg by mouth at bedtime as needed for sleep.   vitamin B-12 (CYANOCOBALAMIN) 1000 MCG tablet Take 1,000 mcg by mouth 3 (three) times a week.   warfarin (COUMADIN) 5 MG tablet Take 1 to 1&1/2  tablets by mouth daily as directed by the coumadin clinic.   Current Facility-Administered Medications for the 04/25/22 encounter (Office Visit) with Vickie Epley, MD  Medication   0.9 %  sodium chloride infusion     Allergies:   Lisinopril and  Niacin   Social History   Socioeconomic History   Marital status: Married    Spouse name: Not on file   Number of children: 2   Years of education: Not on file   Highest education level: Not on file  Occupational History   Occupation: sales  Tobacco Use   Smoking status: Former    Packs/day: 1.00    Years: 8.00    Total pack years: 8.00    Types: Cigarettes    Quit date: 10/10/1997    Years since quitting: 24.5   Smokeless tobacco: Never  Vaping Use   Vaping Use: Never used  Substance and Sexual Activity   Alcohol use: Yes    Comment: few times a week   Drug use: No   Sexual activity: Yes  Other Topics Concern   Not on file  Social History Narrative   Not on file   Social Determinants of Health   Financial Resource Strain: Not on file  Food Insecurity: Not on file  Transportation Needs: Not on file  Physical Activity: Not on file  Stress: Not on file  Social Connections: Not on file     Family History: The patient's family history includes Arthritis in his father; Bone cancer in his maternal uncle; Brain cancer in his brother; Breast cancer in his cousin; Emphysema in his father. There is no history of Colon cancer.  ROS:   Please see the history of present illness.    All other systems reviewed and are negative.  EKGs/Labs/Other Studies Reviewed:    The following studies were reviewed today:  April 18, 2022 transesophageal echo personally reviewed Spontaneous echo contrast in the left atrium and left atrial appendage.  It looks slightly decreased from prior transesophageal echo on side-by-side comparison although there is still a large burden.  Significant dilation of left atrium.  Mild MR.   April 25, 2022 in clinic device interrogation personally reviewed Lead parameters stable Battery longevity 11 years 1 monitored VT episode 100% ventricular pacing  Recent Labs: 04/15/2022: BUN 18; Creatinine, Ser 1.01; Hemoglobin 13.7; Platelets 203; Potassium 4.7;  Sodium 142  Recent Lipid Panel    Component Value Date/Time   CHOL 130 03/06/2019 1404   TRIG 76 03/06/2019 1404   HDL 61 03/06/2019 1404   CHOLHDL 2.1 03/06/2019 1404   CHOLHDL 2 01/08/2015 0843   VLDL 11.0 01/08/2015 0843   LDLCALC 54 03/06/2019 1404    Physical Exam:    VS:  BP 124/78   Pulse 83   Ht '6\' 5"'$  (1.956 m)   Wt 230 lb 12.8 oz (104.7 kg)   SpO2 99%   BMI 27.37 kg/m     Wt Readings from Last 3 Encounters:  04/25/22 230  lb 12.8 oz (104.7 kg)  04/18/22 230 lb (104.3 kg)  04/04/22 230 lb (104.3 kg)     GEN:  Well nourished, well developed in no acute distress HEENT: Normal NECK: No JVD; No carotid bruits LYMPHATICS: No lymphadenopathy CARDIAC: RRR, no murmurs, rubs, gallops.  ICD pocket well-healed RESPIRATORY:  Clear to auscultation without rales, wheezing or rhonchi  ABDOMEN: Soft, non-tender, non-distended MUSCULOSKELETAL:  No edema; No deformity  SKIN: Warm and dry NEUROLOGIC:  Alert and oriented x 3 PSYCHIATRIC:  Normal affect        ASSESSMENT:    1. Permanent atrial fibrillation ([place])   2. Thrombus of left atrial appendage   3. Complete heart block (HCC)    PLAN:    In order of problems listed above:  #Permanent atrial fibrillation #Left atrial appendage thrombus The patient's recent echo shows that the left atrial appendage thrombus has improved somewhat although it is still present.  He is not currently a candidate for watchman because of this.  He will continue Coumadin with a goal INR of 2.5-3.5.  He should follow-up with our clinic in 6 months.  I will plan to repeat imaging at that time.   #CRT-D in situ The patient's CRT-D is functioning appropriately.  The atrial lead is programmed off given his permanent atrial fibrillation.  He is BiV pacing 100% of the time.   Follow-up 6 months or sooner as needed   Medication Adjustments/Labs and Tests Ordered: Current medicines are reviewed at length with the patient today.  Concerns  regarding medicines are outlined above.  No orders of the defined types were placed in this encounter.  No orders of the defined types were placed in this encounter.    Signed, Lars Mage, MD, [facility], [facility] 04/25/2022 9:37 PM    Electrophysiology [facility]

## 2022-04-25 NOTE — Patient Instructions (Signed)
Continue taking 1.5 tablets daily except 1 tablet on Mondays.  Recheck INR in 6 weeks. Anticoagulation Clinic [phone] or [phone]

## 2022-05-06 ENCOUNTER — Telehealth: Payer: Self-pay | Admitting: *Deleted

## 2022-05-06 NOTE — Telephone Encounter (Signed)
Spoke with pt who has had cold symptoms with cough (productive of yellow sputum).  He reports s/s for 3 days.  Has been using cough drops and Nyquil without relief.  Pt denies any fever or flu like symptoms.  Advised OK to use Coricidin OTC and saline nasal spray.  Also advised if no improvement in s/s to follow up with PCP.  Pt states understanding and was appreciative of the call and information.

## 2022-05-06 NOTE — Telephone Encounter (Signed)
This is the email sent by patient.  Submitted Question:  Facility Inquiring About: A [facility] practice or other location (specify below) Practice or other location (if applicable): Dr Quentin Ore Question or Comment:  Have bad cough - need guidance on what to take

## 2022-05-11 DIAGNOSIS — G4733 Obstructive sleep apnea (adult) (pediatric): Secondary | ICD-10-CM | POA: Diagnosis not present

## 2022-05-23 ENCOUNTER — Other Ambulatory Visit: Payer: Self-pay | Admitting: *Deleted

## 2022-05-23 MED ORDER — WARFARIN SODIUM 5 MG PO TABS: ORAL_TABLET | ORAL | 3 refills | Status: DC

## 2022-05-23 NOTE — Telephone Encounter (Signed)
Received request for warfarin refill:  Last INR was 3.1 on 04/25/22 Next INR due on 06/06/22 LOV was 04/25/22  Grayce Sessions MD  Refill approved.

## 2022-06-06 ENCOUNTER — Ambulatory Visit: Payer: PPO | Attending: Cardiology

## 2022-06-06 DIAGNOSIS — Z5181 Encounter for therapeutic drug level monitoring: Secondary | ICD-10-CM

## 2022-06-06 DIAGNOSIS — I4821 Permanent atrial fibrillation: Secondary | ICD-10-CM | POA: Diagnosis not present

## 2022-06-06 DIAGNOSIS — I513 Intracardiac thrombosis, not elsewhere classified: Secondary | ICD-10-CM

## 2022-06-06 LAB — POCT INR: INR: 3.2 — AB (ref 2.0–3.0)

## 2022-06-06 NOTE — Patient Instructions (Signed)
Continue taking 1.5 tablets daily except 1 tablet on Mondays.  Recheck INR in 6 weeks. Anticoagulation Clinic [phone] or [phone]

## 2022-06-08 DIAGNOSIS — G4733 Obstructive sleep apnea (adult) (pediatric): Secondary | ICD-10-CM | POA: Diagnosis not present

## 2022-06-08 DIAGNOSIS — I5022 Chronic systolic (congestive) heart failure: Secondary | ICD-10-CM | POA: Diagnosis not present

## 2022-06-08 DIAGNOSIS — R509 Fever, unspecified: Secondary | ICD-10-CM | POA: Diagnosis not present

## 2022-06-08 DIAGNOSIS — R051 Acute cough: Secondary | ICD-10-CM | POA: Diagnosis not present

## 2022-06-08 DIAGNOSIS — I480 Paroxysmal atrial fibrillation: Secondary | ICD-10-CM | POA: Diagnosis not present

## 2022-06-08 DIAGNOSIS — Z1152 Encounter for screening for COVID-19: Secondary | ICD-10-CM | POA: Diagnosis not present

## 2022-06-08 DIAGNOSIS — R0981 Nasal congestion: Secondary | ICD-10-CM | POA: Diagnosis not present

## 2022-06-08 DIAGNOSIS — I513 Intracardiac thrombosis, not elsewhere classified: Secondary | ICD-10-CM | POA: Diagnosis not present

## 2022-06-25 ENCOUNTER — Encounter: Payer: Self-pay | Admitting: Cardiology

## 2022-06-28 ENCOUNTER — Ambulatory Visit (INDEPENDENT_AMBULATORY_CARE_PROVIDER_SITE_OTHER): Payer: PPO

## 2022-06-28 DIAGNOSIS — I442 Atrioventricular block, complete: Secondary | ICD-10-CM

## 2022-06-29 LAB — CUP PACEART REMOTE DEVICE CHECK
Battery Remaining Longevity: 130 mo
Battery Voltage: 3.02 V
Brady Statistic AP VP Percent: 0 %
Brady Statistic AP VS Percent: 0 %
Brady Statistic AS VP Percent: 99.88 %
Brady Statistic AS VS Percent: 0.12 %
Brady Statistic RA Percent Paced: 0 %
Brady Statistic RV Percent Paced: 99.88 %
Date Time Interrogation Session: 20230920181703
Implantable Lead Implant Date: 20050105
Implantable Lead Implant Date: 20061130
Implantable Lead Implant Date: 20061130
Implantable Lead Location: 753858
Implantable Lead Location: 753859
Implantable Lead Location: 753860
Implantable Lead Model: 4076
Implantable Lead Model: 4194
Implantable Lead Model: 5076
Implantable Pulse Generator Implant Date: 20220322
Lead Channel Impedance Value: 342 Ohm
Lead Channel Impedance Value: 399 Ohm
Lead Channel Impedance Value: 437 Ohm
Lead Channel Impedance Value: 513 Ohm
Lead Channel Impedance Value: 551 Ohm
Lead Channel Impedance Value: 551 Ohm
Lead Channel Impedance Value: 627 Ohm
Lead Channel Impedance Value: 722 Ohm
Lead Channel Impedance Value: 817 Ohm
Lead Channel Pacing Threshold Amplitude: 0.375 V
Lead Channel Pacing Threshold Pulse Width: 0.4 ms
Lead Channel Sensing Intrinsic Amplitude: 0.75 mV
Lead Channel Setting Pacing Amplitude: 1.25 V
Lead Channel Setting Pacing Amplitude: 2 V
Lead Channel Setting Pacing Pulse Width: 0.4 ms
Lead Channel Setting Pacing Pulse Width: 0.4 ms
Lead Channel Setting Sensing Sensitivity: 5.6 mV

## 2022-07-05 ENCOUNTER — Ambulatory Visit: Payer: Self-pay

## 2022-07-05 NOTE — Patient Outreach (Signed)
  Care Coordination   Initial Visit Note   07/05/2022 Name: [name] MRN: [id number] DOB: 10/11/1945  [name] is a 76 y.o. year old male who sees [name], Thomas Filbert., MD for primary care. I spoke with  [name] by phone today.  What matters to the patients health and wellness today?  No concern, doing well at this time    Goals Addressed             This Visit's Progress    COMPLETED: Care Coordination Activities       Care Coordination Interventions: SDoH screening performed - no acute resource challenges identified at this time Determined the patient does not have concerns with medication costs and/or adherence at this time Performed chart review to note patient participated in an annual wellness visit 02/03/22 Education provided on the role of the care coordination team - no follow up desired at this time Instructed the patient to contact his primary care provider as needed         SDOH assessments and interventions completed:  Yes  SDOH Interventions Today    Flowsheet Row Most Recent Value  SDOH Interventions   Food Insecurity Interventions Intervention Not Indicated  Housing Interventions Intervention Not Indicated  Transportation Interventions Intervention Not Indicated  Utilities Interventions Intervention Not Indicated        Care Coordination Interventions Activated:  Yes  Care Coordination Interventions:  Yes, provided   Follow up plan: No further intervention required.   Encounter Outcome:  Pt. Visit Completed   [name], BSW, CDP Social Worker, Certified Dementia Practitioner [place] [facility]  Care Coordination [phone]

## 2022-07-05 NOTE — Patient Instructions (Signed)
Visit Information  Thank you for taking time to visit with me today. Please don't hesitate to contact me if I can be of assistance to you.   Following are the goals we discussed today:   Goals Addressed             This Visit's Progress    COMPLETED: Care Coordination Activities       Care Coordination Interventions: SDoH screening performed - no acute resource challenges identified at this time Determined the patient does not have concerns with medication costs and/or adherence at this time Performed chart review to note patient participated in an annual wellness visit 02/03/22 Education provided on the role of the care coordination team - no follow up desired at this time Instructed the patient to contact his primary care provider as needed         Please call the care guide team at [phone] if you need to schedule an appointment with our care coordination team.  If you are experiencing a Mental Health or [place] or need someone to talk to, please call [phone]-TALK (toll free, 24 hour hotline)  Patient verbalizes understanding of instructions and care plan provided today and agrees to view in [place]. Active MyChart status and patient understanding of how to access instructions and care plan via MyChart confirmed with patient.     No further follow up required: Please contact your primary care provider as needed  Daneen Schick, Arita Miss, CDP Social Worker, Certified Dementia Practitioner [place] Coordination [phone]

## 2022-07-07 NOTE — Progress Notes (Signed)
Remote pacemaker transmission.   

## 2022-07-18 ENCOUNTER — Ambulatory Visit: Payer: PPO | Attending: Cardiology

## 2022-07-18 DIAGNOSIS — I4821 Permanent atrial fibrillation: Secondary | ICD-10-CM | POA: Diagnosis not present

## 2022-07-18 DIAGNOSIS — Z5181 Encounter for therapeutic drug level monitoring: Secondary | ICD-10-CM | POA: Diagnosis not present

## 2022-07-18 LAB — POCT INR: INR: 4.2 — AB (ref 2.0–3.0)

## 2022-07-18 NOTE — Patient Instructions (Signed)
HOLD TODAY ONLY and then Continue taking 1.5 tablets daily except 1 tablet on Mondays.  Recheck INR in 2 weeks. Anticoagulation Clinic [phone] or [phone]

## 2022-08-01 ENCOUNTER — Ambulatory Visit: Payer: PPO | Attending: Cardiology

## 2022-08-01 DIAGNOSIS — I4821 Permanent atrial fibrillation: Secondary | ICD-10-CM

## 2022-08-01 DIAGNOSIS — Z5181 Encounter for therapeutic drug level monitoring: Secondary | ICD-10-CM

## 2022-08-01 LAB — POCT INR: INR: 3.7 — AB (ref 2.0–3.0)

## 2022-08-01 NOTE — Patient Instructions (Signed)
Description   Only take 0.5 tablet today and then continue taking 1.5 tablets daily except 1 tablet on Mondays.  Stay consistent with greens each week.  Recheck INR in 2 weeks. Anticoagulation Clinic [phone]

## 2022-08-15 ENCOUNTER — Other Ambulatory Visit: Payer: Self-pay

## 2022-08-15 ENCOUNTER — Ambulatory Visit (INDEPENDENT_AMBULATORY_CARE_PROVIDER_SITE_OTHER): Payer: PPO

## 2022-08-15 ENCOUNTER — Encounter: Payer: Self-pay | Admitting: Cardiology

## 2022-08-15 ENCOUNTER — Ambulatory Visit: Payer: PPO | Attending: Cardiology | Admitting: Cardiology

## 2022-08-15 ENCOUNTER — Encounter: Payer: Self-pay | Admitting: *Deleted

## 2022-08-15 VITALS — BP 100/60 | HR 80 | Ht 77.0 in | Wt 223.0 lb

## 2022-08-15 DIAGNOSIS — I513 Intracardiac thrombosis, not elsewhere classified: Secondary | ICD-10-CM

## 2022-08-15 DIAGNOSIS — I428 Other cardiomyopathies: Secondary | ICD-10-CM

## 2022-08-15 DIAGNOSIS — I442 Atrioventricular block, complete: Secondary | ICD-10-CM | POA: Diagnosis not present

## 2022-08-15 DIAGNOSIS — Z5181 Encounter for therapeutic drug level monitoring: Secondary | ICD-10-CM | POA: Diagnosis not present

## 2022-08-15 DIAGNOSIS — I4821 Permanent atrial fibrillation: Secondary | ICD-10-CM | POA: Diagnosis not present

## 2022-08-15 DIAGNOSIS — G4733 Obstructive sleep apnea (adult) (pediatric): Secondary | ICD-10-CM | POA: Diagnosis not present

## 2022-08-15 DIAGNOSIS — I4891 Unspecified atrial fibrillation: Secondary | ICD-10-CM | POA: Diagnosis not present

## 2022-08-15 LAB — POCT INR: INR: 3.8 — AB (ref 2.0–3.0)

## 2022-08-15 MED ORDER — WARFARIN SODIUM 5 MG PO TABS: ORAL_TABLET | ORAL | 0 refills | Status: AC

## 2022-08-15 NOTE — Patient Instructions (Signed)
Medication Instructions:  The current medical regimen is effective;  continue present plan and medications.  *If you need a refill on your cardiac medications before your next appointment, please call your pharmacy*  Follow-Up: At [facility], you and your health needs are our priority.  As part of our continuing mission to provide you with exceptional heart care, we have created designated Provider Care Teams.  These Care Teams include your primary Cardiologist (physician) and Advanced Practice Providers (APPs -  Physician Assistants and Nurse Practitioners) who all work together to provide you with the care you need, when you need it.  We recommend signing up for the patient portal called "MyChart".  Sign up information is provided on this After Visit Summary.  MyChart is used to connect with patients for Virtual Visits (Telemedicine).  Patients are able to view lab/test results, encounter notes, upcoming appointments, etc.  Non-urgent messages can be sent to your provider as well.   To learn more about what you can do with MyChart, go to [email or web address].    Your next appointment:   Follow up as needed with Dr Marlou Porch.   Important Information About Sugar

## 2022-08-15 NOTE — Patient Instructions (Signed)
Description   Only take 0.5 tablet today and then START taking 1.5 tablets daily except 1 tablet on Mondays and Fridays.  Stay consistent with greens each week.  Recheck INR in 3 weeks. Anticoagulation Clinic [phone]

## 2022-08-15 NOTE — Progress Notes (Signed)
Cardiology Office Note:    Date:  08/15/2022   ID:  [name], DOB Jun 02, 1946, MRN [id number]  PCP:  Ginger Organ., MD   [facility] Providers Cardiologist:  Candee Furbish, MD Electrophysiologist:  Vickie Epley, MD     Referring MD: Ginger Organ., MD    History of Present Illness:    [name] is a 76 y.o. male with hx of CAD, A-fib, and hyperlipidemia who presents today for a follow-up on permanent atrial fibrillation.   He was last seen on 02/10/2022 for atrial fibrillation management. He denied any cardiac related symptoms. He was advised to continue medical regimens, no changes.  Today, he has been doing well. He is doing well with his biventricular pacemaker, Medtronic.  He noted that he is moving to [place] soon to be near his grandchildren.   Truck driver, maybe 3 days a week.  Has worked for [employer] to [employer].  He denies any palpitations, chest pain, shortness of breath, or peripheral edema. No lightheadedness, headaches, syncope, orthopnea, or PND.  Past Medical History:  Diagnosis Date   AAA (abdominal aortic aneurysm) (HCC)    Arthritis    Atrial fibrillation (HCC)    permanent   CAD (coronary artery disease)    Chronic systolic dysfunction of left ventricle    s/p BiV ICD//downgrade to CRT-P (medtronic) on 04-25-2013 by Dr Rayann Heman   Colon polyps    Complete heart block (HCC)    s/p AV nodal ablation for afib   Dysphagia    Elevated LFTs    Fatigue    GERD (gastroesophageal reflux disease)    Gout    Hemochromatosis    Hyperlipidemia    IFG (impaired fasting glucose)    Osteoarthritis    Sleep apnea     Past Surgical History:  Procedure Laterality Date   AV NODE ABLATION     BIV PACEMAKER GENERATOR CHANGE OUT  04-25-2013   downgrade of previously implanted CRTD to Medtronic CRTP by Dr Rayann Heman.  Previously implanted LV and 4076 RV leads were used   BIV PACEMAKER GENERATOR CHANGEOUT N/A  12/29/2020   Procedure: BIV PACEMAKER GENERATOR CHANGEOUT;  Surgeon: Thompson Grayer, MD;  Location: [facility];  Service: Cardiovascular;  Laterality: N/A;   CARDIAC DEFIBRILLATOR PLACEMENT  2007, 2010   MDT BiV ICD,  [phone] RV lead, though pace/ sense is a Medtronic model 4076 - 58  lead; downgraded to Geiger on 04-25-2013 by Dr Rayann Heman   HAND SURGERY     left   HERNIA REPAIR     HYDROCELE EXCISION     IMPLANTABLE CARDIOVERTER DEFIBRILLATOR GENERATOR CHANGE N/A 04/25/2013   Procedure: IMPLANTABLE CARDIOVERTER DEFIBRILLATOR GENERATOR CHANGE;  Surgeon: Thompson Grayer, MD;  Location: [facility] CATH LAB;  Service: Cardiovascular;  Laterality: N/A;   PACEMAKER INSERTION  2006   REPLACEMENT TOTAL KNEE  2000, 2011 x2   bilateral   TEE WITHOUT CARDIOVERSION N/A 01/04/2022   Procedure: TRANSESOPHAGEAL ECHOCARDIOGRAM (TEE);  Surgeon: Werner Lean, MD;  Location: [facility] ENDOSCOPY;  Service: Cardiovascular;  Laterality: N/A;   TEE WITHOUT CARDIOVERSION N/A 04/18/2022   Procedure: TRANSESOPHAGEAL ECHOCARDIOGRAM (TEE);  Surgeon: Geralynn Rile, MD;  Location: [facility];  Service: Cardiovascular;  Laterality: N/A;    Current Medications: Current Meds  Medication Sig   carvedilol (COREG) 25 MG tablet Take 12.5 mg by mouth 2 (two) times daily.   clonazePAM (KLONOPIN) 0.5 MG tablet Take 0.5 mg by mouth at  bedtime as needed (sleep).   furosemide (LASIX) 20 MG tablet Take 1 tablet (20 mg total) by mouth daily.   rosuvastatin (CRESTOR) 20 MG tablet Take 1 tablet (20 mg total) by mouth daily.   traZODone (DESYREL) 50 MG tablet Take 50 mg by mouth at bedtime as needed for sleep.   vitamin B-12 (CYANOCOBALAMIN) 1000 MCG tablet Take 1,000 mcg by mouth 3 (three) times a week.   warfarin (COUMADIN) 5 MG tablet Take 1 tablet to 1 1/2  tablets by mouth daily as directed by the coumadin clinic.   Current Facility-Administered Medications for the 08/15/22 encounter (Office Visit) with Jerline Pain, MD   Medication   0.9 %  sodium chloride infusion     Allergies:   Lisinopril and Niacin   Social History   Socioeconomic History   Marital status: Married    Spouse name: Not on file   Number of children: 2   Years of education: Not on file   Highest education level: Not on file  Occupational History   Occupation: sales  Tobacco Use   Smoking status: Former    Packs/day: 1.00    Years: 8.00    Total pack years: 8.00    Types: Cigarettes    Quit date: 10/10/1997    Years since quitting: 24.8   Smokeless tobacco: Never  Vaping Use   Vaping Use: Never used  Substance and Sexual Activity   Alcohol use: Yes    Comment: few times a week   Drug use: No   Sexual activity: Yes  Other Topics Concern   Not on file  Social History Narrative   Not on file   Social Determinants of Health   Financial Resource Strain: Not on file  Food Insecurity: No Food Insecurity (07/05/2022)   Hunger Vital Sign    Worried About Running Out of Food in the Last Year: Never true    Ran Out of Food in the Last Year: Never true  Transportation Needs: No Transportation Needs (07/05/2022)   PRAPARE - Hydrologist (Medical): No    Lack of Transportation (Non-Medical): No  Physical Activity: Not on file  Stress: Not on file  Social Connections: Not on file     Family History: The patient's family history includes Arthritis in his father; Bone cancer in his maternal uncle; Brain cancer in his brother; Breast cancer in his cousin; Emphysema in his father. There is no history of Colon cancer.  ROS:   Please see the history of present illness.     All other systems reviewed and are negative.  EKGs/Labs/Other Studies Reviewed:    The following studies were reviewed today:  DG Chest 04/04/2022: FINDINGS: Multi lead AICD device overlies the left hemithorax, leads are stable in position. No consolidative pulmonary opacities. No pleural effusion or pneumothorax. Osseous  structures unremarkable.   IMPRESSION: No active cardiopulmonary disease.  Echo TEE 01/04/2022:  1. Left atrial size was severely dilated. A left atrial/left atrial  appendage thrombus was detected- there is a linear echodensity that  attaches lateral to the superior portion of the left atrial appendage  base. There is heavy smoke, the stalk  attachment is conceing for thrombus. Maximal projections of left atrial  dimensions 30 mm X 31 mm; a 35 mm Watchman FLX may be used if LAAO was  planned.   2. Left ventricular ejection fraction, by estimation, is 50 to 55%. The  left ventricle has low normal function. The  left ventricle has no regional  wall motion abnormalities.   3. Right ventricular systolic function is normal. The right ventricular  size is normal.   4. Right atrial size was severely dilated.   5. 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 Mild mitral valve regurgitation. No evidence of mitral  stenosis.   6. The aortic valve is tricuspid. Aortic valve regurgitation is mild. No  aortic stenosis is present.   7. There is mild (Grade II) plaque involving the descending aorta.    Cardiac CTA 11/25/2021: FINDINGS: A 120 kV prospective scan was triggered in the descending thoracic aorta at 111 HU's. Gantry rotation speed was 280 msecs and collimation was .9 mm. No beta blockade and no NTG was given. The 3D data set was reconstructed in 5% intervals of the 60-80 % of the R-R cycle. Diastolic phases were analyzed on a dedicated work station using MPR, MIP and VRT modes. The patient received 95 cc of contrast.   Pulmonary Veins:   There is normal variant pulmonary vein drainage into the left atrium (3 on the right and 2 on the left) with ostial measurements as follows:   RUPV: 32 mm x 20 mm   RLPV: 24 mm x 21 mm   RMPV: 13 mm x 10 mm   LUPV: 25 mm x 19 mm   LLPV: 23 mm x 18 mm   Left atrium and appendage:   Left atrium: Severely  dilated left atrium. There is a perfusion defect in the tip of the left atrial appendage that dose not completely clear with delay; HU Units 46. This is concerning for left atrial thrombus.   Left atrial appendage: The left atrial appendage is windsock morphology.   Pre-procedural Measurements   Landing Zone Measurements:   Left atrial appendage landing zone size 31 mm X 29 mm   Landing zone could be as large as 46 mm X 32 mm when perfusion defect resolves.   Maximal length from the landing zone of main lobe is 38 mm.   Notes on transseptal puncture: Mid-inferior puncture advised. Device lead should not lead to issues with puncture by may lead to artifact during interventional imaging.   No evidence of PFO or ASD.   Comments on device selection:   Concerning that a 35 mm FLX device would not be have < 10 % compression.   Esophagus the esophagus runs in the left atrial midline.   Aorta:  Normal caliber.  No dissection.  No calcification.   Main pulmonary artery: Moderate dilation 33 mm.   Aortic Valve:  Tri-leaflet. No calcification.   CRT device noted; device in CS, RAAF, and RV apex.   Coronary Arteries: Normal coronary origin. The study was performed without use of NTG and insufficient for plaque evaluation.   Coronary Calcium Score:   Left main: 0   Left anterior descending artery: 135   Left circumflex artery: 11   Right coronary artery: 0   Total: 146   Percentile: 42nd for age, sex, and race matched control.   Extra-cardiac findings: See attached radiology report for non-cardiac structures.   IMPRESSION: 1. There is normal variant pulmonary vein drainage into the left atrium.   2. The left atrial appendage appears to have a left atrial appendage thrombus.   3. Recommend multi-disciplinary team review; concerning that a 35 mm FLX device would not have adequate compression.   4. Coronary calcium score of 146, which is 42nd percentile for  age, sex, and race matched control.   July 2022 echo Left ventricular function normal, 50 to 55% Right ventricular function normal Severely dilated left and right atrium Trivial MR Mild to moderate TR   EKG:  EKG is personally reviewed.  04/04/2022: Ventricular-placed rhythm with frequent premature ventricular complexes, 124 bpm.  Recent Labs: 04/15/2022: BUN 18; Creatinine, Ser 1.01; Hemoglobin 13.7; Platelets 203; Potassium 4.7; Sodium 142  Recent Lipid Panel    Component Value Date/Time   CHOL 130 03/06/2019 1404   TRIG 76 03/06/2019 1404   HDL 61 03/06/2019 1404   CHOLHDL 2.1 03/06/2019 1404   CHOLHDL 2 01/08/2015 0843   VLDL 11.0 01/08/2015 0843   LDLCALC 54 03/06/2019 1404     Risk Assessment/Calculations:              Physical Exam:    VS:  BP 100/60 (BP Location: Left Arm, Patient Position: Sitting, Cuff Size: Normal)   Pulse 80   Ht '6\' 5"'$  (1.956 m)   Wt 223 lb (101.2 kg)   SpO2 96%   BMI 26.44 kg/m     Wt Readings from Last 3 Encounters:  08/15/22 223 lb (101.2 kg)  04/25/22 230 lb 12.8 oz (104.7 kg)  04/18/22 230 lb (104.3 kg)     GEN:  Well nourished, well developed in no acute distress HEENT: Normal NECK: No JVD; No carotid bruits LYMPHATICS: No lymphadenopathy CARDIAC: RRR, no murmurs, no rubs, gallops RESPIRATORY:  Clear to auscultation without rales, wheezing or rhonchi  ABDOMEN: Soft, non-tender, non-distended MUSCULOSKELETAL:  No edema; No deformity  SKIN: Warm and dry NEUROLOGIC:  Alert and oriented x 3 PSYCHIATRIC:  Normal affect   ASSESSMENT:    1. Permanent atrial fibrillation ([place])   2. Thrombus of left atrial appendage   3. NICM (nonischemic cardiomyopathy) ([place])   4. AV BLOCK, COMPLETE     PLAN:    In order of problems listed above:  Permanent pacemaker, CRT-D, AV nodal ablation - Permanently pacing.  Doing well.  EP notes reviewed.  Permanent atrial fibrillation - Rate controlled secondary to AV nodal ablation.   Ejection fraction approximately 50%.  Chronic anticoagulation - Currently on Coumadin.  Previously while on Xarelto demonstrated left atrial appendage thrombus.  He was switched over to Coumadin.  A repeat transesophageal echocardiogram still showed evidence of residual thrombus.  He had TEE performed to consider Watchman device.  Please see reports for full details.  Hyperlipidemia - On Crestor 20 mg a day, prior LDL 54.  At goal.  Excellent.  Coronary artery calcification - 40 percentile noted on calcium score.  Goal-directed medical therapy.    Follow-up: As needed.  Medication Adjustments/Labs and Tests Ordered: Current medicines are reviewed at length with the patient today.  Concerns regarding medicines are outlined above.  No orders of the defined types were placed in this encounter.  No orders of the defined types were placed in this encounter.   Patient Instructions  Medication Instructions:  The current medical regimen is effective;  continue present plan and medications.  *If you need a refill on your cardiac medications before your next appointment, please call your pharmacy*  Follow-Up: At [facility], you and your health needs are our priority.  As part of our continuing mission to provide you with exceptional heart care, we have created designated Provider Care Teams.  These Care Teams include your primary Cardiologist (physician) and Advanced Practice Providers (APPs -  Physician Assistants and Nurse Practitioners) who all work  together to provide you with the care you need, when you need it.  We recommend signing up for the patient portal called "MyChart".  Sign up information is provided on this After Visit Summary.  MyChart is used to connect with patients for Virtual Visits (Telemedicine).  Patients are able to view lab/test results, encounter notes, upcoming appointments, etc.  Non-urgent messages can be sent to your provider as well.   To learn more  about what you can do with MyChart, go to [email or web address].    Your next appointment:   Follow up as needed with Dr Marlou Porch.   Important Information About Sugar         I,Danny Valdes,acting as a scribe for [employer], MD.,have documented all relevant documentation on the behalf of Candee Furbish, MD,as directed by  Candee Furbish, MD while in the presence of Candee Furbish, MD.  I, Candee Furbish, MD, have reviewed all documentation for this visit. The documentation on 08/15/22 for the exam, diagnosis, procedures, and orders are all accurate and complete.   Signed, Candee Furbish, MD  08/15/2022 3:49 PM    [place]

## 2022-08-15 NOTE — Telephone Encounter (Signed)
Prescription refill request received for warfarin [place]: 04/25/22 Next INR check: 11/37/23 Warfarin tablet strength: '5mg'$   Appropriate dose and refill sent to requested pharmacy.

## 2022-08-28 DIAGNOSIS — J40 Bronchitis, not specified as acute or chronic: Secondary | ICD-10-CM | POA: Diagnosis not present

## 2022-08-28 DIAGNOSIS — J019 Acute sinusitis, unspecified: Secondary | ICD-10-CM | POA: Diagnosis not present

## 2022-08-29 ENCOUNTER — Telehealth: Payer: Self-pay | Admitting: *Deleted

## 2022-08-29 NOTE — Telephone Encounter (Signed)
Pt called and stated he was started on Cefdinir '300mg'$ , Zyrtec, Mucinex, and an Inhaler. Advised that these meds are safe to take with warfarin and he does not need to have the INR checked any sooner and he verbalized understanding.

## 2022-09-05 ENCOUNTER — Encounter: Payer: PPO | Admitting: Cardiology

## 2022-09-06 ENCOUNTER — Telehealth: Payer: Self-pay

## 2022-09-06 ENCOUNTER — Encounter: Payer: Self-pay | Admitting: Cardiology

## 2022-09-06 ENCOUNTER — Telehealth: Payer: Self-pay | Admitting: *Deleted

## 2022-09-06 NOTE — Telephone Encounter (Signed)
Patient called back and informed us that his new insurance goes into effect on 12/1, this Friday.    He has not found a provider to monitor INR in [place].   I told him that he could go to [facility] to have drawn until then so he will call again Friday to verify insurance in effect for further advisement.

## 2022-09-06 NOTE — Telephone Encounter (Signed)
Called pt since he is overdue for INR monitoring. There was no answer therefore, left a message for him to call back in reference to this. Will await.

## 2022-09-09 ENCOUNTER — Other Ambulatory Visit (HOSPITAL_BASED_OUTPATIENT_CLINIC_OR_DEPARTMENT_OTHER): Payer: Self-pay | Admitting: Cardiology

## 2022-09-09 ENCOUNTER — Telehealth: Payer: Self-pay

## 2022-09-09 NOTE — Telephone Encounter (Signed)
Per Dr Mardene Speak recommendation on 09/06/22, pt was referred to Elnoria Howard, MD at [facility] in [place], [place]. Called pt to determine if he had an appt with new cardiologist since he moved to [place] because his INR is overdue and needs to be monitored. Pt stated he has not been established with new provider yet.   I called [facility] in [place], [place] and requested the office to call the pt as soon as possible to schedule a New Patient appt. Also provided [facility] contact information if needed.

## 2022-09-10 LAB — PROTIME-INR
INR: 3.4 — ABNORMAL HIGH (ref 0.9–1.2)
Prothrombin Time: 32.9 s — ABNORMAL HIGH (ref 9.1–12.0)

## 2022-09-12 ENCOUNTER — Ambulatory Visit (INDEPENDENT_AMBULATORY_CARE_PROVIDER_SITE_OTHER): Payer: Medicare HMO

## 2022-09-12 DIAGNOSIS — Z5181 Encounter for therapeutic drug level monitoring: Secondary | ICD-10-CM

## 2022-09-12 DIAGNOSIS — I513 Intracardiac thrombosis, not elsewhere classified: Secondary | ICD-10-CM

## 2022-09-12 NOTE — Patient Instructions (Signed)
Description   Spoke with pt and instructed to only take 1 tablet tomorrow and then resume taking 1.5 tablets daily except 1 tablet on Mondays and Fridays.  Stay consistent with greens each week.  Recheck INR on Friday  Anticoagulation Clinic [phone]

## 2022-09-15 ENCOUNTER — Other Ambulatory Visit (HOSPITAL_BASED_OUTPATIENT_CLINIC_OR_DEPARTMENT_OTHER): Payer: Self-pay | Admitting: Cardiology

## 2022-09-16 ENCOUNTER — Encounter: Payer: Self-pay | Admitting: Cardiology

## 2022-09-16 ENCOUNTER — Ambulatory Visit (INDEPENDENT_AMBULATORY_CARE_PROVIDER_SITE_OTHER): Payer: Medicare HMO | Admitting: *Deleted

## 2022-09-16 DIAGNOSIS — I4891 Unspecified atrial fibrillation: Secondary | ICD-10-CM

## 2022-09-16 DIAGNOSIS — I513 Intracardiac thrombosis, not elsewhere classified: Secondary | ICD-10-CM

## 2022-09-16 DIAGNOSIS — I4821 Permanent atrial fibrillation: Secondary | ICD-10-CM | POA: Diagnosis not present

## 2022-09-16 DIAGNOSIS — I442 Atrioventricular block, complete: Secondary | ICD-10-CM

## 2022-09-16 DIAGNOSIS — Z950 Presence of cardiac pacemaker: Secondary | ICD-10-CM

## 2022-09-16 LAB — PROTIME-INR
INR: 3.5 — ABNORMAL HIGH (ref 0.9–1.2)
Prothrombin Time: 33.6 s — ABNORMAL HIGH (ref 9.1–12.0)

## 2022-09-27 ENCOUNTER — Ambulatory Visit (INDEPENDENT_AMBULATORY_CARE_PROVIDER_SITE_OTHER): Payer: Medicare HMO

## 2022-09-27 DIAGNOSIS — I442 Atrioventricular block, complete: Secondary | ICD-10-CM | POA: Diagnosis not present

## 2022-09-27 LAB — CUP PACEART REMOTE DEVICE CHECK
Battery Remaining Longevity: 126 mo
Battery Voltage: 3.02 V
Brady Statistic AP VP Percent: 0 %
Brady Statistic AP VS Percent: 0 %
Brady Statistic AS VP Percent: 99.7 %
Brady Statistic AS VS Percent: 0.3 %
Brady Statistic RA Percent Paced: 0 %
Brady Statistic RV Percent Paced: 99.7 %
Date Time Interrogation Session: 20231218200701
Implantable Lead Connection Status: 753985
Implantable Lead Connection Status: 753985
Implantable Lead Connection Status: 753985
Implantable Lead Implant Date: 20050105
Implantable Lead Implant Date: 20061130
Implantable Lead Implant Date: 20061130
Implantable Lead Location: 753858
Implantable Lead Location: 753859
Implantable Lead Location: 753860
Implantable Lead Model: 4076
Implantable Lead Model: 4194
Implantable Lead Model: 5076
Implantable Pulse Generator Implant Date: 20220322
Lead Channel Impedance Value: 304 Ohm
Lead Channel Impedance Value: 399 Ohm
Lead Channel Impedance Value: 418 Ohm
Lead Channel Impedance Value: 456 Ohm
Lead Channel Impedance Value: 475 Ohm
Lead Channel Impedance Value: 532 Ohm
Lead Channel Impedance Value: 551 Ohm
Lead Channel Impedance Value: 589 Ohm
Lead Channel Impedance Value: 665 Ohm
Lead Channel Pacing Threshold Amplitude: 0.375 V
Lead Channel Pacing Threshold Pulse Width: 0.4 ms
Lead Channel Sensing Intrinsic Amplitude: 0.75 mV
Lead Channel Setting Pacing Amplitude: 1.25 V
Lead Channel Setting Pacing Amplitude: 2 V
Lead Channel Setting Pacing Pulse Width: 0.4 ms
Lead Channel Setting Pacing Pulse Width: 0.4 ms
Lead Channel Setting Sensing Sensitivity: 5.6 mV
Zone Setting Status: 755011

## 2022-09-30 ENCOUNTER — Telehealth: Payer: Self-pay

## 2022-09-30 NOTE — Telephone Encounter (Signed)
Lpm to have PT/INR drawn.

## 2022-10-05 LAB — POCT INR: INR: 2.8 (ref 2.0–3.0)

## 2022-10-06 ENCOUNTER — Ambulatory Visit (INDEPENDENT_AMBULATORY_CARE_PROVIDER_SITE_OTHER): Payer: Medicare HMO | Admitting: Cardiology

## 2022-10-06 ENCOUNTER — Telehealth: Payer: Self-pay | Admitting: Cardiology

## 2022-10-06 DIAGNOSIS — I4821 Permanent atrial fibrillation: Secondary | ICD-10-CM | POA: Diagnosis not present

## 2022-10-06 DIAGNOSIS — Z5181 Encounter for therapeutic drug level monitoring: Secondary | ICD-10-CM

## 2022-10-06 DIAGNOSIS — I513 Intracardiac thrombosis, not elsewhere classified: Secondary | ICD-10-CM

## 2022-10-06 NOTE — Telephone Encounter (Signed)
Pt calling to give INR results as requested. INR was done yesterday results were 2.8. Pt states that he can be called if there are any questions. Please advise

## 2022-10-06 NOTE — Telephone Encounter (Signed)
Please see anticoag encounter from today for further documentation.

## 2022-10-06 NOTE — Patient Instructions (Signed)
Description   Spoke with pt and instructed to resume taking 1.5 tablets daily except 1 tablet on Mondays and Fridays.  Stay consistent with greens each week.  Recheck INR in 2 weeks. Anticoagulation Clinic [phone]

## 2022-10-07 ENCOUNTER — Telehealth: Payer: Self-pay

## 2022-10-07 NOTE — Telephone Encounter (Signed)
Lpmtcb and discuss INR result/recommendations.

## 2022-10-20 ENCOUNTER — Ambulatory Visit (INDEPENDENT_AMBULATORY_CARE_PROVIDER_SITE_OTHER): Payer: Medicare HMO | Admitting: *Deleted

## 2022-10-20 DIAGNOSIS — I4821 Permanent atrial fibrillation: Secondary | ICD-10-CM

## 2022-10-20 DIAGNOSIS — Z5181 Encounter for therapeutic drug level monitoring: Secondary | ICD-10-CM | POA: Diagnosis not present

## 2022-10-20 DIAGNOSIS — I513 Intracardiac thrombosis, not elsewhere classified: Secondary | ICD-10-CM

## 2022-10-20 LAB — PROTIME-INR: INR: 2.6 — AB (ref <=1.20)

## 2022-10-20 NOTE — Patient Instructions (Signed)
Description   Spoke with pt and instructed him to continue taking warfarin 1.5 tablets daily except 1 tablet on Mondays and Fridays.  Stay consistent with greens each week. Recheck INR in 3 weeks-[facility] [place]Could [facility]?  *Pt needs to find a cardiologist/Dr in [place]* Anticoagulation Clinic [phone]

## 2022-10-24 NOTE — Progress Notes (Signed)
Remote pacemaker transmission.   

## 2022-11-11 ENCOUNTER — Telehealth: Payer: Self-pay | Admitting: *Deleted

## 2022-11-11 ENCOUNTER — Other Ambulatory Visit: Payer: Self-pay | Admitting: *Deleted

## 2022-11-11 NOTE — Telephone Encounter (Signed)
Called pt and inquired if he had gotten established with a new Cardiologist since he had moved and he stated he has and they are managing his Anticoagulation. Will discontinue episode of care for anticoagulation at this time.

## 2022-11-25 ENCOUNTER — Encounter: Payer: Self-pay | Admitting: Cardiology

## 2022-11-25 ENCOUNTER — Telehealth: Payer: Self-pay

## 2022-11-25 NOTE — Telephone Encounter (Signed)
11/25/2022-Pt being followed remotely by [facility],

## 2023-06-30 IMAGING — CT CT HEART MORPH/PULM VEIN W/ CM & W/O CA SCORE
1 of 3 series · 5 of 17 positions shown, 7 images · non-contrast
Comparison: None.
COMPARISON: None.

Addendum:
EXAM:
OVER-READ INTERPRETATION  CT CHEST

The following report is an over-read performed by radiologist Dr.
Maruage Dacas [REDACTED] on 11/25/2021. This
over-read does not include interpretation of cardiac or coronary
anatomy or pathology. The cardiac CTA interpretation by the
cardiologist is attached.
CLINICAL DATA: Sizing for a Watchman Flex Device
Cardiac CT/CTA
TECHNIQUE: The patient was scanned on a Siemens Somatom scanner.

[Series 2156: — · 0.48mm/px · 5 of 7 slices shown, 7 images]
[im 2/7  vessel]
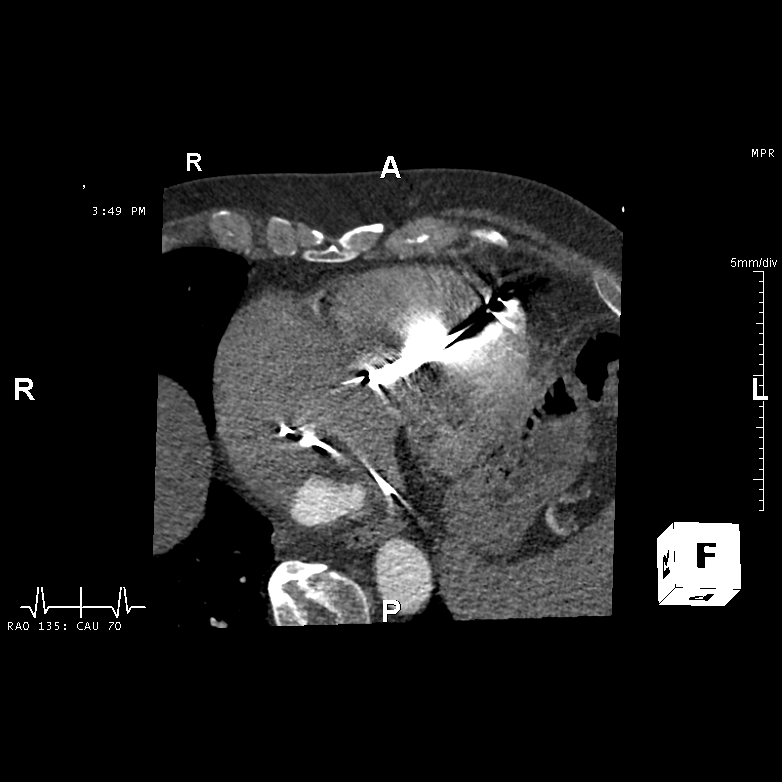
[im 2/7  lung]
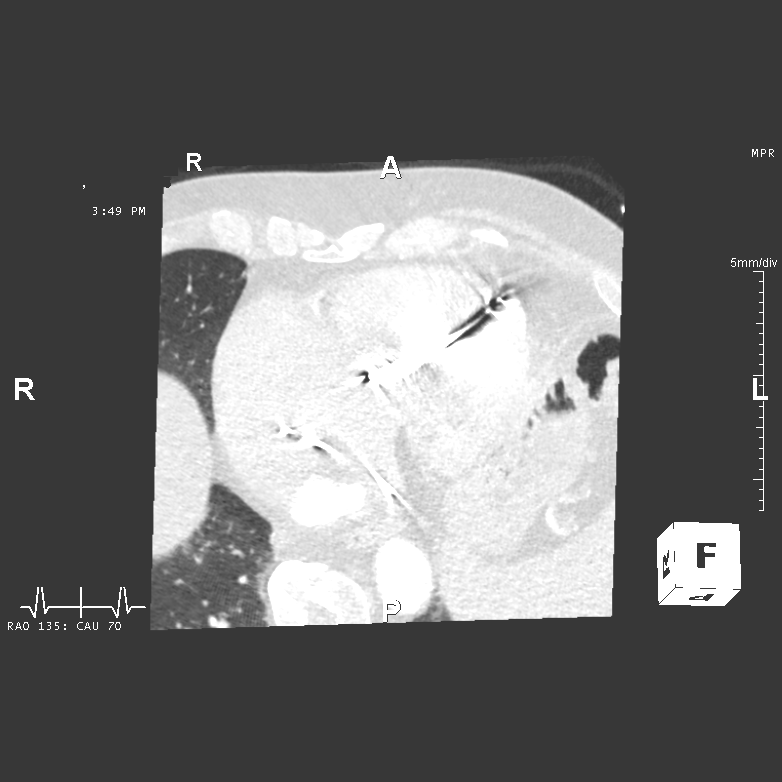
[im 3/7  vessel]
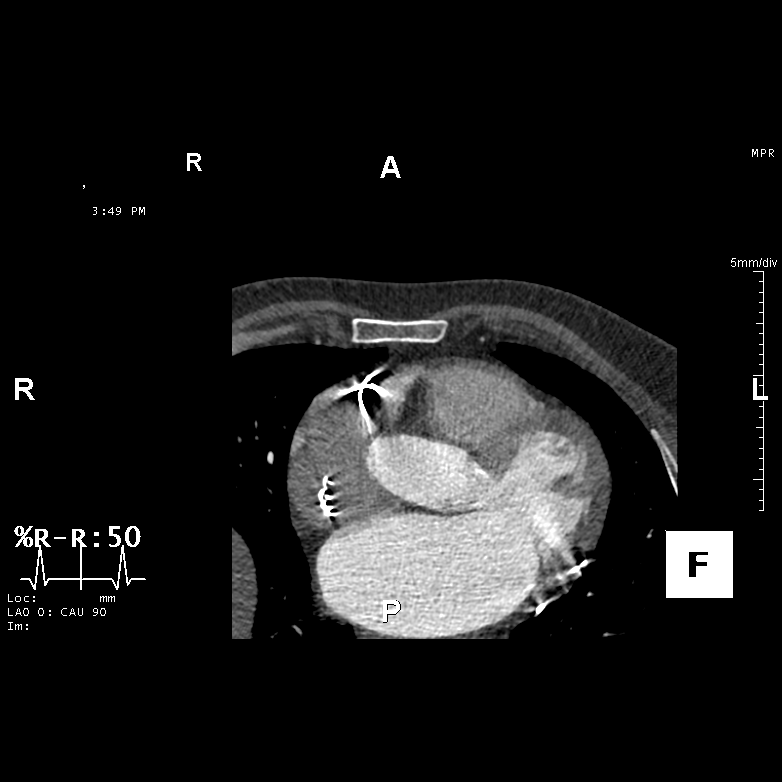
[im 4/7  vessel]
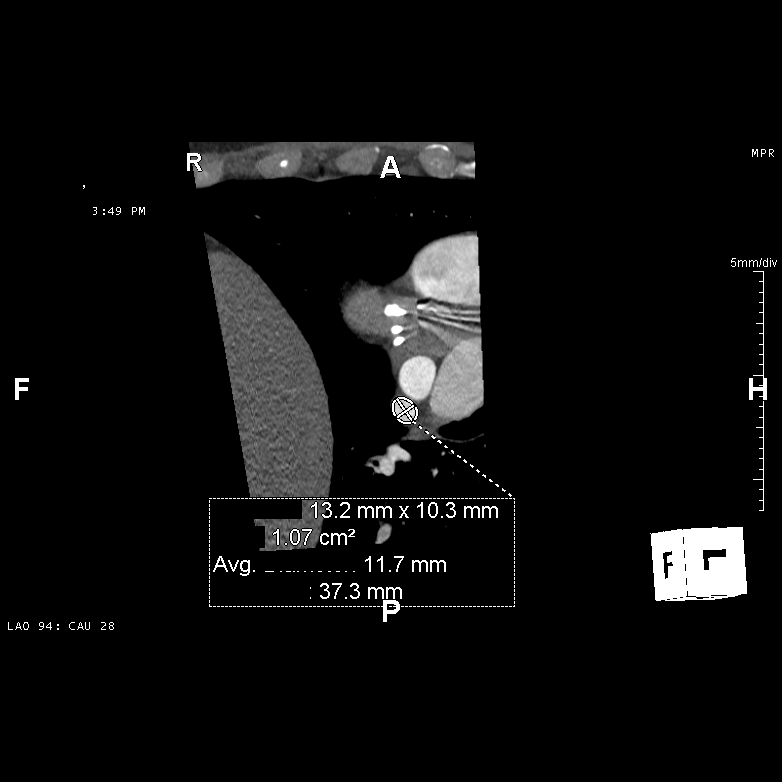
[im 5/7  vessel]
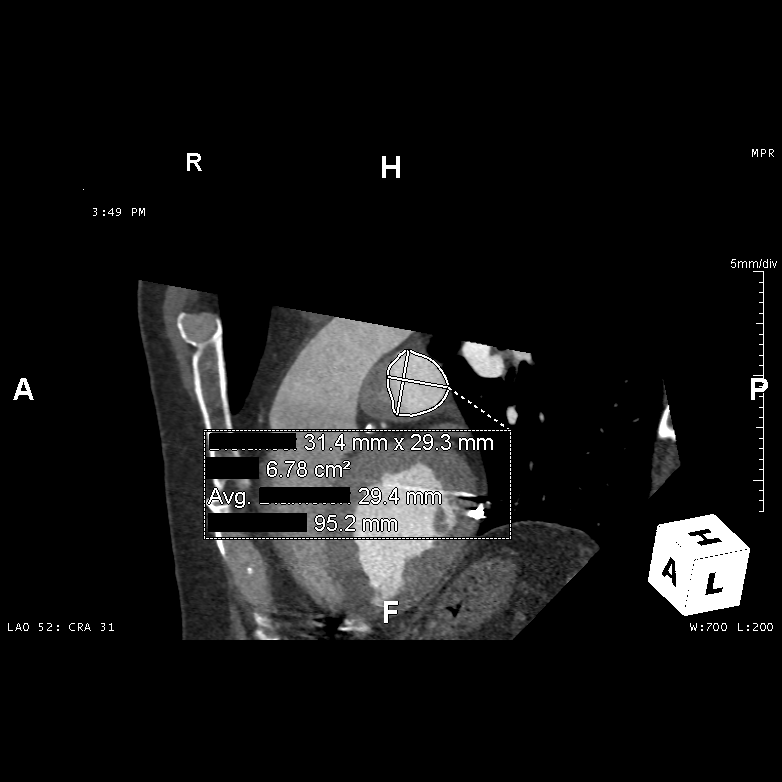
[im 6/7  vessel]
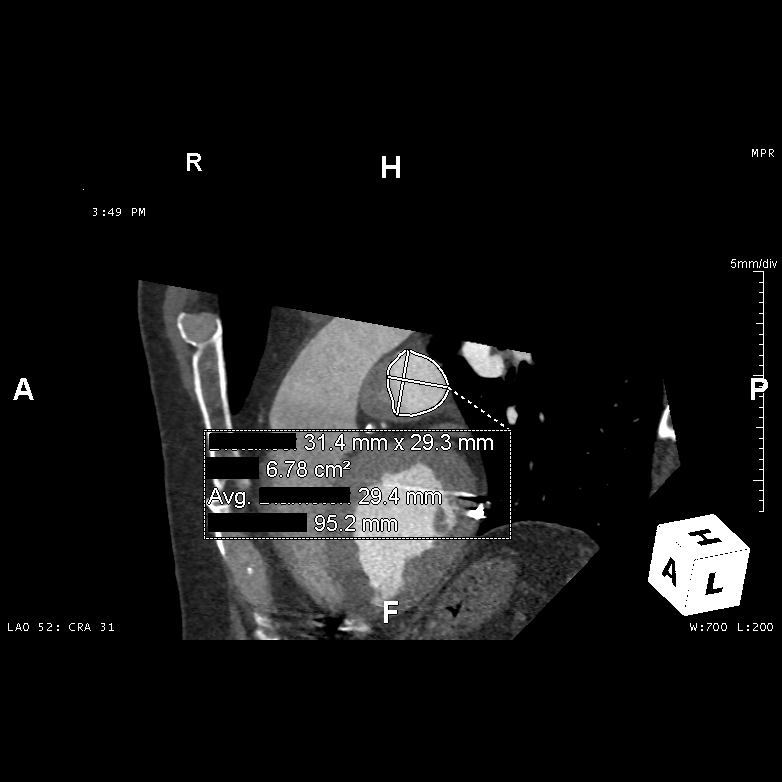
[im 6/7  lung]
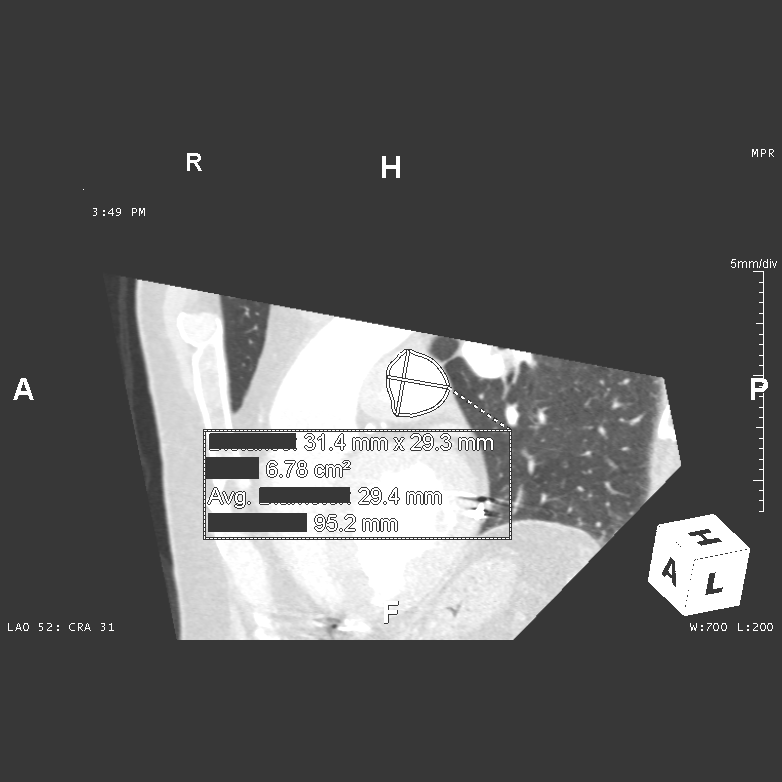

[5 of 17 positions shown; findings below may reference images not displayed]

FINDINGS: Vascular: No significant noncardiac vascular findings.

Mediastinum/Nodes: Visualized mediastinum and hilar regions
demonstrate no lymphadenopathy or masses.

Lungs/Pleura: Visualized lungs show no evidence of pulmonary edema,
consolidation, pneumothorax, nodule or pleural fluid.

Upper Abdomen: No acute abnormality.

Musculoskeletal: No chest wall mass or suspicious bone lesions
identified.
IMPRESSION: No significant incidental findings.
FINDINGS: A 120 kV prospective scan was triggered in the descending thoracic
aorta at 111 HU's. Gantry rotation speed was 280 msecs and
collimation was .9 mm. No beta blockade and no NTG was given. The 3D
data set was reconstructed in 5% intervals of the 60-80 % of the R-R
cycle. Diastolic phases were analyzed on a dedicated work station
using MPR, MIP and VRT modes. The patient received 95 cc of
contrast.

Pulmonary Veins:

There is normal variant pulmonary vein drainage into the left atrium
(3 on the right and 2 on the left) with ostial measurements as
follows:

RUPV: 32 mm x 20 mm

RLPV: 24 mm x 21 mm

RMPV: 13 mm x 10 mm

LUPV: 25 mm x 19 mm

LLPV: 23 mm x 18 mm

Left atrium and appendage:

Left atrium: Severely dilated left atrium. There is a perfusion
defect in the tip of the left atrial appendage that dose not
completely clear with delay; HU Units 46. This is concerning for
left atrial thrombus.

Left atrial appendage: The left atrial appendage is windsock
morphology.

Pre-procedural Measurements

Landing Zone Measurements:

Left atrial appendage landing zone size 31 mm X 29 mm

Landing zone could be as large as 46 mm X 32 mm when perfusion
defect resolves.

Maximal length from the landing zone of main lobe is 38 mm.

Notes on transseptal puncture: Mid-inferior puncture advised. Device
lead should not lead to issues with puncture by may lead to artifact
during interventional imaging.

No evidence of PFO or ASD.

Comments on device selection:

Concerning that a 35 mm NUHDIJA device would not be have < 10 %
compression.

Esophagus the esophagus runs in the left atrial midline.

Aorta:  Normal caliber.  No dissection.  No calcification.

Main pulmonary artery: Moderate dilation 33 mm.

Aortic Valve:  Tri-leaflet. No calcification.

JHEMBOY device noted; device in CS, RONLOR, and RV apex.

Coronary Arteries: Normal coronary origin. The study was performed
without use of NTG and insufficient for plaque evaluation.

Coronary Calcium Score:

Left main: 0

Left anterior descending artery: 135

Left circumflex artery: 11

Right coronary artery: 0

Total: 146

Percentile: 42nd for age, sex, and race matched control.

Extra-cardiac findings: See attached radiology report for
non-cardiac structures.
IMPRESSION: 1. There is normal variant pulmonary vein drainage into the left
atrium.

2. The left atrial appendage appears to have a left atrial appendage
thrombus.

3. Recommend multi-disciplinary team review; concerning that a 35 mm
NUHDIJA device would not have adequate compression.

4. Coronary calcium score of 146, which is 42nd percentile for age,
sex, and race matched control.

RECOMMENDATIONS:



If CAC = 0, it is reasonable to withhold statin therapy and reassess
in 5 to 10 years, as long as higher risk conditions are absent
(diabetes mellitus, family history of premature CHD in first degree
relatives (males <55 years; females <65 years), cigarette smoking,
LDL >=190 mg/dL or other independent risk factors).

If CAC is 1 to 99, it is reasonable to initiate statin therapy for
patients ?55 years of age.

If CAC is >=100 or >=75th percentile, it is reasonable to initiate
statin therapy at any age.

Cardiology referral should be considered for patients with CAC
scores =400 or >=75th percentile.

*4793 AHA/ACC/AACVPR/AAPA/ABC/UCHIHA/INCREASE/CHAI/[place]/YUHIN/STRYPES/ZARRILLO
Guideline on the Management of Blood Cholesterol: A Report of the
American College of Cardiology/American Heart Association Task Force
on Clinical Practice Guidelines. J Am Coll Cardiol.
1366;73(24):9325-9865.

*** End of Addendum ***
EXAM:
OVER-READ INTERPRETATION  CT CHEST

The following report is an over-read performed by radiologist Dr.
Maruage Dacas [REDACTED] on 11/25/2021. This
over-read does not include interpretation of cardiac or coronary
anatomy or pathology. The cardiac CTA interpretation by the
cardiologist is attached.
FINDINGS: Vascular: No significant noncardiac vascular findings.

Mediastinum/Nodes: Visualized mediastinum and hilar regions
demonstrate no lymphadenopathy or masses.

Lungs/Pleura: Visualized lungs show no evidence of pulmonary edema,
consolidation, pneumothorax, nodule or pleural fluid.

Upper Abdomen: No acute abnormality.

Musculoskeletal: No chest wall mass or suspicious bone lesions
identified.
IMPRESSION: No significant incidental findings.
